# Patient Record
Sex: Female | Born: 1940 | Race: White | Hispanic: No | Marital: Married | Smoking: Former smoker
Health system: Southern US, Community
[De-identification: ages and names within clinical notes are randomized; demographics above are authoritative.]

## PROBLEM LIST (undated history)

## (undated) DIAGNOSIS — E039 Hypothyroidism, unspecified: Secondary | ICD-10-CM

---

## 2020-04-15 ENCOUNTER — Emergency Department (HOSPITAL_COMMUNITY): Payer: Medicare PPO

## 2020-04-15 ENCOUNTER — Other Ambulatory Visit: Payer: Self-pay

## 2020-04-15 ENCOUNTER — Inpatient Hospital Stay (HOSPITAL_COMMUNITY)
Admission: EM | Admit: 2020-04-15 | Discharge: 2020-05-09 | DRG: 884 | Disposition: A | Discharge status: Skilled Nursing Facility | Payer: Medicare PPO | Attending: Family Medicine | Admitting: Family Medicine

## 2020-04-15 ENCOUNTER — Encounter (HOSPITAL_COMMUNITY): Payer: Self-pay | Admitting: Internal Medicine

## 2020-04-15 DIAGNOSIS — R739 Hyperglycemia, unspecified: Secondary | ICD-10-CM | POA: Diagnosis present

## 2020-04-15 DIAGNOSIS — H5789 Other specified disorders of eye and adnexa: Secondary | ICD-10-CM | POA: Diagnosis present

## 2020-04-15 DIAGNOSIS — R55 Syncope and collapse: Secondary | ICD-10-CM | POA: Diagnosis present

## 2020-04-15 DIAGNOSIS — G8929 Other chronic pain: Secondary | ICD-10-CM | POA: Diagnosis present

## 2020-04-15 DIAGNOSIS — E039 Hypothyroidism, unspecified: Secondary | ICD-10-CM | POA: Diagnosis present

## 2020-04-15 DIAGNOSIS — N183 Chronic kidney disease, stage 3 unspecified: Secondary | ICD-10-CM | POA: Diagnosis present

## 2020-04-15 DIAGNOSIS — M479 Spondylosis, unspecified: Secondary | ICD-10-CM | POA: Diagnosis present

## 2020-04-15 DIAGNOSIS — I700 Atherosclerosis of aorta: Secondary | ICD-10-CM | POA: Diagnosis present

## 2020-04-15 DIAGNOSIS — I2510 Atherosclerotic heart disease of native coronary artery without angina pectoris: Secondary | ICD-10-CM | POA: Diagnosis present

## 2020-04-15 DIAGNOSIS — E860 Dehydration: Secondary | ICD-10-CM | POA: Diagnosis present

## 2020-04-15 DIAGNOSIS — G9341 Metabolic encephalopathy: Secondary | ICD-10-CM | POA: Insufficient documentation

## 2020-04-15 DIAGNOSIS — R1084 Generalized abdominal pain: Secondary | ICD-10-CM | POA: Diagnosis present

## 2020-04-15 DIAGNOSIS — E8889 Other specified metabolic disorders: Secondary | ICD-10-CM | POA: Diagnosis present

## 2020-04-15 DIAGNOSIS — F0391 Unspecified dementia with behavioral disturbance: Secondary | ICD-10-CM | POA: Diagnosis present

## 2020-04-15 DIAGNOSIS — I129 Hypertensive chronic kidney disease with stage 1 through stage 4 chronic kidney disease, or unspecified chronic kidney disease: Secondary | ICD-10-CM | POA: Diagnosis present

## 2020-04-15 DIAGNOSIS — K5900 Constipation, unspecified: Secondary | ICD-10-CM | POA: Diagnosis present

## 2020-04-15 DIAGNOSIS — F329 Major depressive disorder, single episode, unspecified: Secondary | ICD-10-CM | POA: Diagnosis present

## 2020-04-15 DIAGNOSIS — Z9119 Patient's noncompliance with other medical treatment and regimen: Secondary | ICD-10-CM

## 2020-04-15 DIAGNOSIS — R4182 Altered mental status, unspecified: Secondary | ICD-10-CM

## 2020-04-15 DIAGNOSIS — Z515 Encounter for palliative care: Secondary | ICD-10-CM

## 2020-04-15 DIAGNOSIS — F40240 Claustrophobia: Secondary | ICD-10-CM | POA: Diagnosis present

## 2020-04-15 DIAGNOSIS — R627 Adult failure to thrive: Secondary | ICD-10-CM

## 2020-04-15 DIAGNOSIS — Z79890 Hormone replacement therapy (postmenopausal): Secondary | ICD-10-CM | POA: Diagnosis not present

## 2020-04-15 DIAGNOSIS — Z66 Do not resuscitate: Secondary | ICD-10-CM | POA: Diagnosis present

## 2020-04-15 DIAGNOSIS — K449 Diaphragmatic hernia without obstruction or gangrene: Secondary | ICD-10-CM | POA: Diagnosis not present

## 2020-04-15 DIAGNOSIS — N1831 Chronic kidney disease, stage 3a: Secondary | ICD-10-CM | POA: Diagnosis not present

## 2020-04-15 DIAGNOSIS — N179 Acute kidney failure, unspecified: Secondary | ICD-10-CM | POA: Diagnosis not present

## 2020-04-15 DIAGNOSIS — F03918 Unspecified dementia, unspecified severity, with other behavioral disturbance: Secondary | ICD-10-CM | POA: Diagnosis present

## 2020-04-15 DIAGNOSIS — Z79899 Other long term (current) drug therapy: Secondary | ICD-10-CM

## 2020-04-15 DIAGNOSIS — K219 Gastro-esophageal reflux disease without esophagitis: Secondary | ICD-10-CM | POA: Diagnosis present

## 2020-04-15 DIAGNOSIS — E7800 Pure hypercholesterolemia, unspecified: Secondary | ICD-10-CM | POA: Diagnosis present

## 2020-04-15 DIAGNOSIS — R32 Unspecified urinary incontinence: Secondary | ICD-10-CM | POA: Diagnosis present

## 2020-04-15 DIAGNOSIS — F0390 Unspecified dementia without behavioral disturbance: Secondary | ICD-10-CM | POA: Diagnosis not present

## 2020-04-15 DIAGNOSIS — G301 Alzheimer's disease with late onset: Secondary | ICD-10-CM | POA: Diagnosis not present

## 2020-04-15 DIAGNOSIS — F0281 Dementia in other diseases classified elsewhere with behavioral disturbance: Secondary | ICD-10-CM | POA: Diagnosis present

## 2020-04-15 DIAGNOSIS — M069 Rheumatoid arthritis, unspecified: Secondary | ICD-10-CM | POA: Diagnosis present

## 2020-04-15 DIAGNOSIS — Z20822 Contact with and (suspected) exposure to covid-19: Secondary | ICD-10-CM | POA: Diagnosis present

## 2020-04-15 DIAGNOSIS — R531 Weakness: Secondary | ICD-10-CM | POA: Diagnosis not present

## 2020-04-15 DIAGNOSIS — M419 Scoliosis, unspecified: Secondary | ICD-10-CM | POA: Diagnosis present

## 2020-04-15 DIAGNOSIS — R159 Full incontinence of feces: Secondary | ICD-10-CM | POA: Diagnosis present

## 2020-04-15 DIAGNOSIS — F03C0 Unspecified dementia, severe, without behavioral disturbance, psychotic disturbance, mood disturbance, and anxiety: Secondary | ICD-10-CM

## 2020-04-15 DIAGNOSIS — R1310 Dysphagia, unspecified: Secondary | ICD-10-CM | POA: Diagnosis not present

## 2020-04-15 DIAGNOSIS — Z7189 Other specified counseling: Secondary | ICD-10-CM | POA: Diagnosis not present

## 2020-04-15 DIAGNOSIS — Z87891 Personal history of nicotine dependence: Secondary | ICD-10-CM | POA: Diagnosis not present

## 2020-04-15 DIAGNOSIS — Z955 Presence of coronary angioplasty implant and graft: Secondary | ICD-10-CM

## 2020-04-15 DIAGNOSIS — R410 Disorientation, unspecified: Secondary | ICD-10-CM | POA: Diagnosis not present

## 2020-04-15 HISTORY — DX: Hypothyroidism, unspecified: E03.9

## 2020-04-15 LAB — CBC WITH DIFFERENTIAL/PLATELET
Abs Immature Granulocytes: 0.03 10*3/uL (ref 0.00–0.07)
Basophils Absolute: 0.1 10*3/uL (ref 0.0–0.1)
Basophils Relative: 1 %
Eosinophils Absolute: 0.1 10*3/uL (ref 0.0–0.5)
Eosinophils Relative: 1 %
HCT: 42.2 % (ref 36.0–46.0)
Hemoglobin: 13.8 g/dL (ref 12.0–15.0)
Immature Granulocytes: 0 %
Lymphocytes Relative: 11 %
Lymphs Abs: 1.3 10*3/uL (ref 0.7–4.0)
MCH: 31.4 pg (ref 26.0–34.0)
MCHC: 32.7 g/dL (ref 30.0–36.0)
MCV: 96.1 fL (ref 80.0–100.0)
Monocytes Absolute: 1.4 10*3/uL — ABNORMAL HIGH (ref 0.1–1.0)
Monocytes Relative: 12 %
Neutro Abs: 8.6 10*3/uL — ABNORMAL HIGH (ref 1.7–7.7)
Neutrophils Relative %: 75 %
Platelets: 353 10*3/uL (ref 150–400)
RBC: 4.39 MIL/uL (ref 3.87–5.11)
RDW: 13.3 % (ref 11.5–15.5)
WBC: 11.4 10*3/uL — ABNORMAL HIGH (ref 4.0–10.5)
nRBC: 0 % (ref 0.0–0.2)

## 2020-04-15 LAB — URINALYSIS, ROUTINE W REFLEX MICROSCOPIC
Bacteria, UA: NONE SEEN
Bilirubin Urine: NEGATIVE
Glucose, UA: NEGATIVE mg/dL
Ketones, ur: 20 mg/dL — AB
Leukocytes,Ua: NEGATIVE
Nitrite: NEGATIVE
Protein, ur: 100 mg/dL — AB
Specific Gravity, Urine: 1.025 (ref 1.005–1.030)
pH: 5 (ref 5.0–8.0)

## 2020-04-15 LAB — COMPREHENSIVE METABOLIC PANEL
ALT: 18 U/L (ref 0–44)
AST: 25 U/L (ref 15–41)
Albumin: 3.9 g/dL (ref 3.5–5.0)
Alkaline Phosphatase: 59 U/L (ref 38–126)
Anion gap: 12 (ref 5–15)
BUN: 26 mg/dL — ABNORMAL HIGH (ref 8–23)
CO2: 24 mmol/L (ref 22–32)
Calcium: 9.7 mg/dL (ref 8.9–10.3)
Chloride: 102 mmol/L (ref 98–111)
Creatinine, Ser: 1.27 mg/dL — ABNORMAL HIGH (ref 0.44–1.00)
GFR calc Af Amer: 47 mL/min — ABNORMAL LOW (ref >60)
GFR calc non Af Amer: 41 mL/min — ABNORMAL LOW (ref >60)
Glucose, Bld: 102 mg/dL — ABNORMAL HIGH (ref 70–99)
Potassium: 3.9 mmol/L (ref 3.5–5.1)
Sodium: 138 mmol/L (ref 135–145)
Total Bilirubin: 1.1 mg/dL (ref 0.3–1.2)
Total Protein: 7.1 g/dL (ref 6.5–8.1)

## 2020-04-15 LAB — SARS CORONAVIRUS 2 BY RT PCR (HOSPITAL ORDER, PERFORMED IN ~~LOC~~ HOSPITAL LAB): SARS Coronavirus 2: NEGATIVE

## 2020-04-15 LAB — LIPASE, BLOOD: Lipase: 15 U/L (ref 11–51)

## 2020-04-15 MED ORDER — ZIPRASIDONE HCL 40 MG PO CAPS
40.0000 mg | ORAL_CAPSULE | Freq: Two times a day (BID) | ORAL | Status: DC
  Administered 2020-04-15 – 2020-04-19 (×8): 40 mg via ORAL
  Filled 2020-04-15 (×2): qty 1
  Filled 2020-04-15: qty 2
  Filled 2020-04-15 (×6): qty 1

## 2020-04-15 MED ORDER — SODIUM CHLORIDE 0.9 % IV BOLUS
1000.0000 mL | Freq: Once | INTRAVENOUS | Status: AC
  Administered 2020-04-15: 1000 mL via INTRAVENOUS

## 2020-04-15 MED ORDER — HEPARIN SODIUM (PORCINE) 5000 UNIT/ML IJ SOLN
5000.0000 [IU] | Freq: Three times a day (TID) | INTRAMUSCULAR | Status: DC
  Administered 2020-04-15 – 2020-04-17 (×5): 5000 [IU] via SUBCUTANEOUS
  Filled 2020-04-15 (×3): qty 1

## 2020-04-15 MED ORDER — HALOPERIDOL LACTATE 5 MG/ML IJ SOLN
2.0000 mg | Freq: Once | INTRAMUSCULAR | Status: AC
  Administered 2020-04-15: 2 mg via INTRAVENOUS
  Filled 2020-04-15: qty 1

## 2020-04-15 NOTE — ED Notes (Signed)
Pt self removed IV. Cathter in tact. Site cleaned and wrapped.

## 2020-04-15 NOTE — ED Notes (Signed)
In and out unsuccessfull X2. Byrd Hesselbach PA made aware

## 2020-04-15 NOTE — ED Notes (Signed)
Pt removed bp cuff and refuses vitals at this time

## 2020-04-15 NOTE — ED Notes (Signed)
Pt refuses vitals and removed BP cuff and pulse ox

## 2020-04-15 NOTE — ED Notes (Signed)
Pt able to drink a few sips of water.

## 2020-04-15 NOTE — H&P (Signed)
History and Physical    Kristin Grimes ELF:[id number] DOB: 1941-08-26 DOA: 04/15/2020  PCP: Patient, No Pcp Per   Chief Complaint: Mental status changes/combative nature  HPI: Kristin Grimes is a 79 y.o. female with unknown medical history other than advanced dementia, questionable esophageal surgery unspecified.  Patient recently moved from [place] with husband with known advanced dementia with worsening mental status over the past 4 weeks since the move.  Family is concerned that this is a psychotic issue related to her dementia worsening acutely and is now refusing to take p.o.  Patient's family is also concerned that her refusal of p.o. may be related in someway to her distant history of esophageal surgery.  There is discussion about possible syncopal event over the past few weeks but patient was brought to the ED acutely today due to worsening mental status and combative nature with family at home.   ED Course: In the ED patient was evaluated routine lab work was obtained, patient became markedly combative requiring 2 mg of IV Haldol with minimal to no improvement, subsequently patient was given 40 mg of Geodon p.o. with moderate improvement but ultimately decision was made for one-to-one sitter to be at bedside given patient's ongoing removal of close, IV was pulled, telemetry was ripped off.  Unfortunately husband left in the middle of patient's combative state and would not return to help reorient the patient as he was "worn out."  Patient had routine imaging including CT head given mental status changes and chest x-ray with no overt findings.  Patient's lab work was equally as unimpressive with minimally elevated glucose but otherwise unremarkable for any acute findings.   Review of Systems: Markedly limited given patient's mental status and combative nature.   Assessment/Plan Active Problems:   Acute metabolic encephalopathy   Acute metabolic encephalopathy of unclear  etiology in origin rule out delirium on advanced dementia No clear indication for infectious process Rule out polypharmacy -At this time patient is alert to person only, cannot recall husband's name or location her home address her birthday or any other meaningful information at this time -Husband indicates that patient is usually quite challenged with historical questions but is not as aggressive or combative as she has been over the past few weeks -We discussed that given patient's recent moved to a new location with husband with advanced dementia this was likely only worsening her condition as she cannot reorient to a new location and does not understand her surroundings, certainly being in the hospital will only exacerbate this, patient is at exceedingly high risk for hospital delirium -We will hold all medications in case polypharmacy is the culprit, patient is essentially refusing all care at this point, again we would stress to the family to be present during treatment to help reorient the patient and to ensure appropriate evaluations can be performed and medications can be administered -Family is asking for a psychiatry referral which we discussed would be somewhat limited in the inpatient setting as the patient is neither suicidal or homicidal we can certainly ask psychiatry to weigh in tomorrow for medication recommendations, patient will need outpatient follow-up as well given recent move and no follow-up here for PCP, psych, or otherwise  History of esophageal surgery, unspecified -Unclear history, surgery was performed at [facility] some years ago, family indicate patient had a poor p.o. intake at that time with abdominal pain and a single event of syncope -Patient again is poorly taking p.o. but appears to be without any pain on  exam or history, patient had a single episode of syncope at some point over the past few weeks unspecified but family did not bring patient in at that point, unclear if  orthostatic in nature given poor p.o. intake -No indication for surgical or GI consult at this time, attempted to advance patient's diet this afternoon but she refused to take any p.o. from people which she "does not know"  Questionable syncopal event -As above unclear event, was not within the past few days, possibly orthostatic given patient's poor p.o. intake per family -Family relates syncope to previous esophageal surgery, again patient's abdominal exam is benign and patient and family deny any complaints of pain chest poor p.o. intake over the past few weeks  DVT prophylaxis: SCDs Code Status: Full Family Communication: Husband over the phone Status is: Inpatient  Dispo: The patient is from: Home              Anticipated d/c is to: Unclear, pending further work-up and evaluation              Anticipated d/c date is: Likely 48 to 72 hours, potentially longer if patient has prolonged delirium event or requires further imaging, medication or possibly inpatient psychiatric care              Patient currently not medically stable for discharge due to ongoing need for close monitoring, medication management in the setting of acute delirium, aggressive behavior and confusion.  Consultants:   None  Procedures:   None planned  Past medical history as above, advanced dementia, past surgical history as above unspecified esophageal surgery; family denies any use of tobacco alcohol or illicit substances.  Family history noncontributory given patient's age.  Prior to Admission medications   Not on File    Physical Exam: Vitals:   04/15/20 1133 04/15/20 1145 04/15/20 1200 04/15/20 1245  BP: (!) 169/93 (!) 181/146    Pulse: 72 82 76   Resp: (!) 22 18 18  (!) 21  Temp: 98.2 F (36.8 C)     TempSrc: Oral     SpO2: 100% 100% 98%     Constitutional: NAD, calm, comfortable Vitals:   04/15/20 1133 04/15/20 1145 04/15/20 1200 04/15/20 1245  BP: (!) 169/93 (!) 181/146    Pulse: 72 82 76     Resp: (!) 22 18 18  (!) 21  Temp: 98.2 F (36.8 C)     TempSrc: Oral     SpO2: 100% 100% 98%    *Of note physical exam somewhat limited due to patient's combative nature* General:  Pleasantly resting in bed, No acute distress. HEENT:  Normocephalic atraumatic.  Sclerae nonicteric, noninjected.  Extraocular movements intact bilaterally. Neck:  Without mass or deformity.  Trachea is midline. Lungs: Without respiratory distress, no accessory muscle use Heart:  Regular rate and rhythm.  Without murmurs, rubs, or gallops. Abdomen:  Soft, nontender, nondistended -unable to auscultate Extremities: Without cyanosis, clubbing, edema, or obvious deformity on limited exam Skin:  Warm and dry, without obvious ulcerations or erythema on limited exam  Labs on Admission: I have personally reviewed following labs and imaging studies  CBC: Recent Labs  Lab 04/15/20 1142  WBC 11.4*  NEUTROABS 8.6*  HGB 13.8  HCT 42.2  MCV 96.1  PLT 353   Basic Metabolic Panel: Recent Labs  Lab 04/15/20 1142  NA 138  K 3.9  CL 102  CO2 24  GLUCOSE 102*  BUN 26*  CREATININE 1.27*  CALCIUM 9.7   GFR:  CrCl cannot be calculated (Unknown ideal weight.). Liver Function Tests: Recent Labs  Lab 04/15/20 1142  AST 25  ALT 18  ALKPHOS 59  BILITOT 1.1  PROT 7.1  ALBUMIN 3.9   Recent Labs  Lab 04/15/20 1142  LIPASE 15   No results for input(s): AMMONIA in the last 168 hours. Coagulation Profile: No results for input(s): INR, PROTIME in the last 168 hours. Cardiac Enzymes: No results for input(s): CKTOTAL, CKMB, CKMBINDEX, TROPONINI in the last 168 hours. BNP (last 3 results) No results for input(s): PROBNP in the last 8760 hours. HbA1C: No results for input(s): HGBA1C in the last 72 hours. CBG: No results for input(s): GLUCAP in the last 168 hours. Lipid Profile: No results for input(s): CHOL, HDL, LDLCALC, TRIG, CHOLHDL, LDLDIRECT in the last 72 hours. Thyroid Function Tests: No results  for input(s): TSH, T4TOTAL, FREET4, T3FREE, THYROIDAB in the last 72 hours. Anemia Panel: No results for input(s): VITAMINB12, FOLATE, FERRITIN, TIBC, IRON, RETICCTPCT in the last 72 hours. Urine analysis: No results found for: COLORURINE, APPEARANCEUR, LABSPEC, PHURINE, GLUCOSEU, HGBUR, BILIRUBINUR, KETONESUR, PROTEINUR, UROBILINOGEN, NITRITE, LEUKOCYTESUR  Radiological Exams on Admission: CT Head Wo Contrast  Result Date: 04/15/2020 CLINICAL DATA:  Altered mental status. EXAM: CT HEAD WITHOUT CONTRAST TECHNIQUE: Contiguous axial images were obtained from the base of the skull through the vertex without intravenous contrast. COMPARISON:  None. FINDINGS: Brain: Mild diffuse cerebral atrophy with ex vacuo dilatation. Scattered and confluent periventricular and deep white matter hypodensities are nonspecific however commonly associated with chronic microvascular ischemic changes. No midline shift. No mass lesion. No extra-axial fluid collection. No acute infarct or intracranial hemorrhage. Motion artifact limits evaluation of the vertex. Vascular: No hyperdense vessel. Bilateral carotid siphon atherosclerotic calcifications. Skull: Negative for fracture or focal lesion. Sinuses/Orbits: Normal orbits. Layering bilateral sphenoid sinus secretions. No mastoid effusion. Other: None. IMPRESSION: No acute intracranial process. Mild cerebral atrophy and chronic microvascular ischemic changes. Layering sphenoid sinus secretions may reflect active sinus disease. Electronically Signed   By: Stana Bunting M.D.   On: 04/15/2020 13:40   DG Chest Portable 1 View  Result Date: 04/15/2020 CLINICAL DATA:  Altered mental status. EXAM: PORTABLE CHEST 1 VIEW COMPARISON:  None. FINDINGS: Telemetry wires overlie the upper and left thorax. Bibasilar streaky opacities. No pneumothorax or pleural effusion. Aortic atherosclerotic calcifications. Cardiomediastinal silhouette is within normal limits of size. Multilevel  spondylosis and left glenohumeral osteoarthrosis. IMPRESSION: Bibasilar opacities, likely atelectasis. Electronically Signed   By: Stana Bunting M.D.   On: 04/15/2020 13:35    Azucena Fallen DO [facility]  If 7PM-7AM, please contact night-coverage [email or web address]   04/15/2020, 3:07 PM

## 2020-04-15 NOTE — ED Notes (Addendum)
Pt refused food at this time. Pt ate two bites of apple sauce with medication

## 2020-04-15 NOTE — ED Triage Notes (Signed)
Pt arrived via [facility] from home CC abd, nausea and hallucinations reported by family. Pt and family deny emesis and medication compliance. Per EMS ambulatory at baseline and A/OX4.    Per husband would like to request psych eval and is otw.    Hx dementia, HTN, GERD

## 2020-04-15 NOTE — TOC Initial Note (Signed)
Transition of Care [facility]) - Initial/Assessment Note    Patient Details  Name: Kristin Grimes MRN: [id number] Date of Birth: 1941-01-20  [facility] CM/SW Contact:    Elliot Cousin, RN Phone Number: [phone] 04/15/2020, 2:26 PM  Clinical Narrative:                 TOC CM spoke to pt at bedside, she is disoriented to situation, place and time. Gave permission to speak to husband. Contacted husband via phone states they recently moved to be closer to son and his wife. States son is working on PCP for pt. Gave permission to speak to son, [name]. Contacted son and states they have been working with  [facility] to discuss long term care at ALF/Memory Care. States pt has not had a mental health evaluation or seen a specialist in a long time for Dementia. The facility is requiring prior to pt being admitted to ALF/Memory Care. States his father is having a difficult time managing her at home. Educated husband/son that follow up outpt with specialist and PCP will be able to help with pt's long term needs for treatment and proper placement. Message sent to Ed provider.    Expected Discharge Plan: Skilled Nursing Facility Barriers to Discharge: Continued Medical Work up   Patient Goals and CMS Choice        Expected Discharge Plan and Services Expected Discharge Plan: Skilled Nursing Facility In-house Referral: Clinical Social Work Discharge Planning Services: CM Consult   Living arrangements for the past 2 months: Apartment                                      Prior Living Arrangements/Services Living arrangements for the past 2 months: Apartment Lives with:: Spouse Patient language and need for interpreter reviewed:: Yes        Need for Family Participation in Patient Care: Yes (Comment) Care giver support system in place?: Yes (comment) Current home services: DME(rolling walker) Criminal Activity/Legal Involvement Pertinent to Current  Situation/Hospitalization: No - Comment as needed  Activities of Daily Living      Permission Sought/Granted Permission sought to share information with : Case Manager, Facility Medical sales representative, PCP, Family Supports Permission granted to share information with : Yes, Verbal Permission Granted  Share Information with NAME: Floy, Angert  Permission granted to share info w AGENCY: SNF  Permission granted to share info w Relationship: husband, son  Permission granted to share info w Contact Information: [phone]  Emotional Assessment Appearance:: Appears stated age   Affect (typically observed): Agitated Orientation: : Oriented to Self      Admission diagnosis:  abdominal pain hallucinations;possible uti There are no problems to display for this patient.  PCP:  Patient, No Pcp Per Pharmacy:  No Pharmacies Listed    Social Determinants of Health (SDOH) Interventions    Readmission Risk Interventions No flowsheet data found.

## 2020-04-15 NOTE — ED Notes (Signed)
Case management at bedside.

## 2020-04-15 NOTE — ED Notes (Signed)
Pt removed yellow socks, gown and threw in floor. Pt was re dressed and covered up.

## 2020-04-15 NOTE — ED Notes (Signed)
Pt removed blankets and yellow socks and threw on floor

## 2020-04-15 NOTE — ED Notes (Signed)
Pts husband and Byrd Hesselbach PA at bedside   Pt states " yall are animals in here I need to get out of here let em out of here before you hurt me"

## 2020-04-15 NOTE — ED Provider Notes (Addendum)
[facility]-EMERGENCY DEPT Provider Note   CSN: [id number] Arrival date & time: 04/15/20  1118     History Chief Complaint  Patient presents with  . Abdominal Pain  . Dementia    Greg Eckrich is a 79 y.o. female.  HPI  LEVEL 5 CAVEAT 47/58 AMS  79 year old female with a history of dementia, esophageal hernia, hypertension, GERD (history is limited secondary to limited chart review as the patient just moved here from [place]) presents with altered mental status, failure to thrive, and abdominal pain.  History provided by husband and son.  They report that she has a history of pseudodementia, which has been worsening over the last 4 weeks since their move from [place] to [place].  However the last 3 days, the patient has been increasingly agitated, verbally aggressive, has not eaten or drinking anything in 5 days.  Patient's husband refers that she has a history of an esophageal hernia and "stomach was twisted" proximally 5 years ago with surgery done at [facility].  He states that she used to have syncopal episodes with lots of burping and complaining of abdominal pain.  After her surgery, she stopped complaining about this.  However over the last 2 weeks, she has now started again complaining of abdominal pain, had 1 syncopal episode 2 weeks ago, and has not been eating or drinking anything.  Husband relates that she has had episodes of incontinence of bowel and bladder.  Triage notes state that she had episodes of syncope with hallucinations and eyes rolling back into her head, though her husband refutes this.  She has not had any recent falls.  She is not on a blood thinners.  He is concerned that her esophageal hernia might have returned since she complains of similar symptoms that mimicked her last hernia.  He denied any fevers, nausea, vomiting, bloody diarrhea.  They expressed a concern over there ability to continue caring for their mother.  She is not on any medication  for her dementia and they have no physicians in the [place] area.  They are questioning if she needs to start the medications or if she needs to be placed in a facility.  Her son reiterates that her latest mental status has been extremely taxing on his father and he is worried about his father's own mental health secondary to this.  They are asking for an evaluation of the patient and possible help in managing her dementia.    No past medical history on file.  There are no problems to display for this patient.      OB History   No obstetric history on file.     No family history on file.  Social History   Tobacco Use  . Smoking status: Not on file  Substance Use Topics  . Alcohol use: Not on file  . Drug use: Not on file    Home Medications Prior to Admission medications   Not on File    Allergies    Patient has no allergy information on record.  Review of Systems   Review of Systems  Unable to perform ROS: Dementia    Physical Exam Updated Vital Signs BP (!) 181/146   Pulse 76   Temp 98.2 F (36.8 C) (Oral)   Resp (!) 21   SpO2 98%   Physical Exam Vitals and nursing note reviewed.  Constitutional:      General: She is not in acute distress.    Appearance: She is well-developed. She  is not ill-appearing, toxic-appearing or diaphoretic.     Comments: Physical exam limited secondary to patient being combative and restless.  She is alert, oriented to self, but not place and time.  She states "how my supposed to know where I am and what year it is".  She is moving all 4 extremities without difficulty  HENT:     Head: Normocephalic and atraumatic.  Eyes:     Conjunctiva/sclera: Conjunctivae normal.  Cardiovascular:     Rate and Rhythm: Normal rate and regular rhythm.     Heart sounds: Normal heart sounds. No murmur.  Pulmonary:     Effort: Pulmonary effort is normal. No respiratory distress.     Breath sounds: Normal breath sounds.  Abdominal:      General: Abdomen is flat.     Palpations: Abdomen is soft.     Tenderness: There is no abdominal tenderness.     Hernia: No hernia is present.     Comments: Abdomen soft and nontender   Musculoskeletal:     Cervical back: Neck supple.     Comments: Visible scoliosis of the spine  Skin:    General: Skin is warm and dry.     Coloration: Skin is not pale.     Findings: No rash.  Neurological:     Mental Status: She is alert.  Psychiatric:        Attention and Perception: She does not perceive auditory or visual hallucinations.        Mood and Affect: Affect is angry.        Speech: Speech is rapid and pressured.        Behavior: Behavior is agitated, aggressive and hyperactive.     ED Results / Procedures / Treatments   Labs (all labs ordered are listed, but only abnormal results are displayed) Labs Reviewed  CBC WITH DIFFERENTIAL/PLATELET - Abnormal; Notable for the following components:      Result Value   WBC 11.4 (*)    Neutro Abs 8.6 (*)    Monocytes Absolute 1.4 (*)    All other components within normal limits  COMPREHENSIVE METABOLIC PANEL - Abnormal; Notable for the following components:   Glucose, Bld 102 (*)    BUN 26 (*)    Creatinine, Ser 1.27 (*)    GFR calc non Af Amer 41 (*)    GFR calc Af Amer 47 (*)    All other components within normal limits  SARS CORONAVIRUS 2 BY RT PCR (HOSPITAL ORDER, PERFORMED IN [facility])  LIPASE, BLOOD  URINALYSIS, ROUTINE W REFLEX MICROSCOPIC    EKG EKG Interpretation  Date/Time:  Friday April 15 2020 11:48:50 EDT Ventricular Rate:  83 PR Interval:    QRS Duration: 125 QT Interval:  383 QTC Calculation: 450 R Axis:   -51 Text Interpretation: Sinus rhythm Paired ventricular premature complexes 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 EXAM: CT HEAD WITHOUT CONTRAST TECHNIQUE: Contiguous axial images were obtained from the base of the skull through the vertex without intravenous contrast. COMPARISON:  None. FINDINGS: Brain: Mild diffuse cerebral atrophy with ex vacuo dilatation. Scattered and confluent periventricular and deep white matter hypodensities are nonspecific however commonly associated with chronic microvascular ischemic changes. No midline shift. No mass lesion. No extra-axial fluid collection. No acute infarct or intracranial  hemorrhage. Motion artifact limits evaluation of the vertex. Vascular: No hyperdense vessel. Bilateral carotid siphon atherosclerotic calcifications. Skull: Negative for fracture or focal lesion. Sinuses/Orbits: Normal orbits. Layering bilateral sphenoid sinus secretions. No mastoid effusion. Other: None. IMPRESSION: No acute intracranial process. Mild cerebral atrophy and chronic microvascular ischemic changes. Layering sphenoid sinus secretions may reflect active sinus disease. Electronically Signed   By: Stana Bunting M.D.   On: 04/15/2020 13:40   DG Chest Portable 1 View  Result Date: 04/15/2020 CLINICAL DATA:  Altered mental status. EXAM: PORTABLE CHEST 1 VIEW COMPARISON:  None. FINDINGS: Telemetry wires overlie the upper and left thorax. Bibasilar streaky opacities. No pneumothorax or pleural effusion. Aortic atherosclerotic calcifications. Cardiomediastinal silhouette is within normal limits of size. Multilevel spondylosis and left glenohumeral osteoarthrosis. IMPRESSION: Bibasilar opacities, likely atelectasis. Electronically Signed   By: Stana Bunting M.D.   On: 04/15/2020 13:35    Procedures Procedures (including critical care time)  Medications Ordered in ED Medications  haloperidol lactate (HALDOL) injection 2 mg (2 mg Intravenous Given 04/15/20 1227)  sodium chloride 0.9 % bolus 1,000 mL (0 mLs Intravenous Stopped 04/15/20 1400)    ED Course  I  have reviewed the triage vital signs and the nursing notes.  Pertinent labs & imaging results that were available during my care of the patient were reviewed by me and considered in my medical decision making (see chart for details).    MDM Rules/Calculators/A&P                     79 year old female with altered mental status, failure to thrive, and abdominal pain x several weeks On presentation to the ER, the patient is alert, but only oriented to self, not place and time.  She is extremely agitated, trying to climb out of the bed, yelling obscenities at the nurses.  She is overall well-appearing, nontoxic looking, in no acute respiratory or physical distress.  Physical exam limited secondary to agitation, but her abdomen is soft and nontender, heart and lung exam without abnormalities.  No evidence of rashes or bruises.  She does have a history of severe scoliosis which is evident on physical exam.  She is hypertensive and slightly tachycardic, but other vitals are reassuring.  We will start with generalized altered mental status work-up, will anticipate need for admission as the patient has not been eating or drinking over the last 5 days.  I had extensive conversations with her husband and her son, her husband is her power of attorney, but he states that he will be amenable to whenever his son wants to be done for his wife.  They expressed extensive concern over their ability to care for their mother, states that her recent decline in her mental status has been extremely taxing to the family, especially her husband.  They are also concerned that potentially her esophageal hernia has returned, as she seems to be complaining of abdominal pain, having increased burping, and has had one episode of syncope which were all symptoms that she had before her hernia surgery.  Her son relates however that she is constantly complaining of pain all over, which they are not sure if this is legitimate or secondary to  her dementia.  They have expressed that they will need help in managing her dementia and possible SNF placement.  1:43 PM: CBC with mild leukocytosis of 11.4, normal hemoglobin.  CMP without significant electrolyte abnormalities, her BUN and creatinine are mildly elevated at 1.27 and 26  respectively, no previous lab work to compare this to but I suspect this is this in the setting of dehydration.  Lipase normal.  UA still pending.  She is hypertensive in the ED, though I suspect this is secondary to her level of agitation.  She did receive 2 of Haldol in the ER.  Receiving fluid bolus.  Chest x-ray with bibasilar atelectasis, no acute infection.  EKG normal sinus rhythm.  CT head without acute intracranial process.  Will consult GI and then consult hospitalist for admission.   2:54PM: Consulted Dr. Avon Gully w/ hospitalist group, she will be admitted for failure to thrive, worsening altered mental status. I have also consulted [facility], they have ordered a barium swallow to evaluate anatomy of the esophagus, they will continue to follow.  Nursing staff attempted in and out cath x2 without success.  I spoke with the patient's son and updated them of her admission.  She will likely also need evaluation by psychiatry for formal diagnosis and probable medication start.  Transitions of care has also been consulted, they are working on SNF placement, requested PT consult which I gladly placed.  Patient has remained hemodynamically stable throughout the ED course.  I discussed the case with Dr. Alvino Chapel who is agreeable to the above plan.   FOn breathinal Clinical Impression(s) / ED Diagnoses Final diagnoses:  Esophageal hernia  Generalized abdominal pain  Failure to thrive in adult  Altered mental status, unspecified altered mental status type    Rx / DC Orders ED Discharge Orders    None           Davonna Belling, MD 04/15/20 1513    Garald Balding, PA-C 04/15/20 1532    Davonna Belling, MD 04/16/20 [phone]

## 2020-04-15 NOTE — ED Notes (Signed)
Pt refuses vitals and removed BP cuff and pulse ox °

## 2020-04-15 NOTE — ED Notes (Signed)
Bladder scanner showed 

## 2020-04-15 NOTE — ED Notes (Signed)
Pt removed BP cuff stating " I dont want that thing on me its too tight"

## 2020-04-15 NOTE — Progress Notes (Signed)
Called earlier by ED about patient with symptoms similar to when she required " esophageal hernia repair' at [facility] 5 years ago. I suspect this was a hiatal hernia repair?    Having abdominal pain,  belching and ? Syncope. ED inquiring about imaging. Her LFTs and lipase are okay. Recommended upper GI series to evaluate post-surgical anatomy. Still awaiting study to be done.

## 2020-04-15 NOTE — ED Notes (Signed)
Pt aware urine sample needed. Unable to place pure wick due to pt increased mobility in bed. Pt denies urge to void at this time

## 2020-04-15 NOTE — ED Notes (Signed)
Pts son at bedside to report new information about pts status. Pts son reports " my mother has not eaten in 5 days and often experiences syncopal episode without LOC. My dad cannot take care of her at home anymore and I want to have her evaluated for Psych." Byrd Hesselbach PA made aware

## 2020-04-16 DIAGNOSIS — R627 Adult failure to thrive: Secondary | ICD-10-CM

## 2020-04-16 DIAGNOSIS — G9341 Metabolic encephalopathy: Secondary | ICD-10-CM

## 2020-04-16 DIAGNOSIS — F0390 Unspecified dementia without behavioral disturbance: Secondary | ICD-10-CM

## 2020-04-16 DIAGNOSIS — R4182 Altered mental status, unspecified: Secondary | ICD-10-CM

## 2020-04-16 DIAGNOSIS — R410 Disorientation, unspecified: Secondary | ICD-10-CM

## 2020-04-16 DIAGNOSIS — R1310 Dysphagia, unspecified: Secondary | ICD-10-CM

## 2020-04-16 DIAGNOSIS — R55 Syncope and collapse: Secondary | ICD-10-CM

## 2020-04-16 LAB — COMPREHENSIVE METABOLIC PANEL
ALT: 22 U/L (ref 0–44)
AST: 32 U/L (ref 15–41)
Albumin: 4.2 g/dL (ref 3.5–5.0)
Alkaline Phosphatase: 66 U/L (ref 38–126)
Anion gap: 12 (ref 5–15)
BUN: 21 mg/dL (ref 8–23)
CO2: 23 mmol/L (ref 22–32)
Calcium: 9.9 mg/dL (ref 8.9–10.3)
Chloride: 102 mmol/L (ref 98–111)
Creatinine, Ser: 1.16 mg/dL — ABNORMAL HIGH (ref 0.44–1.00)
GFR calc Af Amer: 53 mL/min — ABNORMAL LOW (ref >60)
GFR calc non Af Amer: 45 mL/min — ABNORMAL LOW (ref >60)
Glucose, Bld: 124 mg/dL — ABNORMAL HIGH (ref 70–99)
Potassium: 4.2 mmol/L (ref 3.5–5.1)
Sodium: 137 mmol/L (ref 135–145)
Total Bilirubin: 1.1 mg/dL (ref 0.3–1.2)
Total Protein: 7.9 g/dL (ref 6.5–8.1)

## 2020-04-16 LAB — CBC
HCT: 47.3 % — ABNORMAL HIGH (ref 36.0–46.0)
Hemoglobin: 15.6 g/dL — ABNORMAL HIGH (ref 12.0–15.0)
MCH: 31.5 pg (ref 26.0–34.0)
MCHC: 33 g/dL (ref 30.0–36.0)
MCV: 95.4 fL (ref 80.0–100.0)
Platelets: 395 10*3/uL (ref 150–400)
RBC: 4.96 MIL/uL (ref 3.87–5.11)
RDW: 13.4 % (ref 11.5–15.5)
WBC: 11.3 10*3/uL — ABNORMAL HIGH (ref 4.0–10.5)
nRBC: 0 % (ref 0.0–0.2)

## 2020-04-16 NOTE — Progress Notes (Addendum)
[facility] Consult: 10:48 AM 04/16/2020  LOS: 1 day    Referring Provider: Dr Louanne Belton  Primary Care Physician:  Patient, No Pcp Per Primary Gastroenterologist:  Althia Forts pt     Reason for Consultation:  Pt refusal to eat.     HPI: Kristin Grimes is a 79 y.o. female.  Relocated with her husband from [place] to [place] a month ago.  Has yet to establish medical care here in [place].  Hx dementia, no meds for this.  Failure to thrive.  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  Surgery resolved previous episodes of abdominal pain, burping and syncope. Review of meds suggest pt has hypercholesterolemia, possible hypertension, anemia (on once daily iron), hypothyroidism on Synthroid.  Depression on Lexapro, ativan 2 weeks ago began complaining of abdominal pain and had syncopal spell.  Episodic bowel and bladder incontinence.  Refusing to take p.o. meds and food/liquids.  Increasingly combative husband concerned for recurrent esophageal hernia given similarity of symptoms.  Patient also has had increasing behavioral health issues which are taxing on her husband.  CT head: Mild atrophy, chronic microvascular ischemic changes.  Layering sphenoid sinus secretions, ?  Active sinus disease.  No acute intracranial process. CXR: BB opacities, likely atx.  Multilevel spondylosis, left Keli note humeral arthritis.  Aortic atherosclerotic calcifications. BA esophagram ordered. WBCs 11.4.  Hb 15.6.  BUN/creatinine 26/1.2.  Lipase, LFTs normal.  Electrolytes normal.  Glucose 124.  No evidence for UTI on urinalysis.  This morning the patient drank water and took her meds without issues.  As I interviewed the patient, she denied abdominal pain, difficulty swallowing,  nausea.  Family wants patient placed in a nursing facility as they cannot manage her at home.  Unable to obtain much in the way of social or family history from the patient.    Past Medical History:  Diagnosis Date  . Hypothyroidism     History reviewed. No pertinent surgical history.  Prior to Admission medications   Medication Sig Start Date End Date Taking? Authorizing Provider  atorvastatin (LIPITOR) 40 MG tablet Take 40 mg by mouth daily.   Yes [provider]  carvedilol (COREG) 3.125 MG tablet Take 3.125 mg by mouth 2 (two) times daily with a meal.   Yes [provider]  cholecalciferol (VITAMIN D3) 25 MCG (1000 UNIT) tablet Take 1,000 Units by mouth daily.   Yes [provider]  escitalopram (LEXAPRO) 20 MG tablet Take 20 mg by mouth daily.   Yes [provider]  ferrous sulfate 324 MG TBEC Take 324 mg by mouth.   Yes [provider]  levothyroxine (SYNTHROID) 112 MCG tablet Take 112 mcg by mouth daily before breakfast.   Yes [provider]  lisinopril (ZESTRIL) 40 MG tablet Take 40 mg by mouth daily.   Yes [provider]  LORazepam (ATIVAN) 0.5 MG tablet Take 0.5 mg by mouth 2 (two) times daily as needed for anxiety or sleep.   Yes [provider]  Multiple Vitamin (MULTIVITAMIN WITH MINERALS) TABS  tablet Take 1 tablet by mouth daily.   Yes [provider]  NIFEdipine (ADALAT CC) 30 MG 24 hr tablet Take 30 mg by mouth daily.   Yes [provider]  ondansetron (ZOFRAN) 8 MG tablet Take 8 mg by mouth every 8 (eight) hours as needed for nausea or vomiting.   Yes [provider]  pantoprazole (PROTONIX) 40 MG tablet Take 40 mg by mouth daily.   Yes [provider]  vitamin B-12 (CYANOCOBALAMIN) 500 MCG tablet Take 500 mcg by mouth daily.   Yes [provider]    Scheduled Meds: . heparin  5,000 Units Subcutaneous Q8H  . ziprasidone  40 mg Oral BID WC    Infusions:  PRN Meds:    Allergies as of 04/15/2020  . (No Known Allergies)    History reviewed. No pertinent family history.  Social History   Socioeconomic History  . Marital status: Married    Spouse name: Not on file  . Number of children: Not on file  . Years of education: Not on file  . Highest education level: Not on file  Occupational History  . Not on file  Tobacco Use  . Smoking status: Former Smoker    Types: Cigarettes  . Smokeless tobacco: Never Used  Substance and Sexual Activity  . Alcohol use: Yes    Comment: occasional  . Drug use: Never  . Sexual activity: Not on file  Other Topics Concern  . Not on file  Social History Narrative  . Not on file   Social Determinants of Health   Financial Resource Strain:   . Difficulty of Paying Living Expenses:   Food Insecurity:   . Worried About Programme researcher, broadcasting/film/video in the Last Year:   . Barista in the Last Year:   Transportation Needs:   . Freight forwarder (Medical):   Marland Kitchen Lack of Transportation (Non-Medical):   Physical Activity:   . Days of Exercise per Week:   . Minutes of Exercise per Session:   Stress:   . Feeling of Stress :   Social Connections:   . Frequency of Communication with Friends and Family:   . Frequency of Social Gatherings with Friends and Family:   . Attends Religious Services:   . Active Member of Clubs or Organizations:   . Attends Banker Meetings:   Marland Kitchen Marital Status:   Intimate Partner Violence:   . Fear of Current or Ex-Partner:   . Emotionally Abused:   Marland Kitchen Physically Abused:   . Sexually Abused:     REVIEW OF SYSTEMS: Constitutional: No mention of patient having weakness or fatigue. ENT:  No nose bleeds Pulm: No mention of difficulty breathing and patient denies this. CV:  No palpitations, no LE edema.  GU:  No hematuria, no frequency GI: See HPI. Heme: No reports of unusual bleeding or bruising. Transfusions: Unknown history. Neuro:   No headaches, no peripheral tingling or numbness.  Details surrounding syncopal spell a couple of weeks ago unclear. Derm:  No itching, no rash or sores.  Endocrine:  No sweats or chills.  No polyuria or dysuria Immunization: Patient does not know if she is received Covid vaccination Travel:  None beyond local counties in last few months.    PHYSICAL EXAM: Vital signs in last 24 hours: Vitals:   04/16/20 0542 04/16/20 0933  BP: (!) 151/87 114/85  Pulse: 74 71  Resp: 17 17  Temp: 98.9 F (37.2 C) (!) 97.5  F (36.4 C)  SpO2: 97% 98%   Wt Readings from Last 3 Encounters:  04/16/20 82.1 kg    General: Physically the patient looks well, quite robust in fact though when she walks she is a little bit teeter tottering on her feet.  She is alert, content but confused. Head: No facial asymmetry or swelling.  No signs of head trauma. Eyes: No scleral icterus or conjunctival pallor. Ears: Not hard of hearing Nose: No discharge Mouth: Mucosa pink, moist, clear.  Tongue midline.  Good dentition Neck: No JVD, no masses, no thyromegaly Lungs: Clear bilaterally Heart: RRR.  No MRG.  S1, S2 present Abdomen: Soft without tenderness, swelling or masses.  No HSM.  Active bowel sounds.  Not distended.   Rectal: Deferred Musc/Skeltl: No joint redness, swelling or gross deformity. Extremities: No CCE. Neurologic: Although she has fluid speech, she is confused and offers non sequitur answers to some of the questions.  The president is "a nice guy".  Cannot tell me what town or place she is in.  Does not recall the year but admits this readily.  Moves all 4 limbs.  Walks in hallway with minimal assistance but has a bit of a tottering gait suggestive of some balance issues. Skin: No telangiectasia, significant purpura or bruising.  No skin tears. Tattoos: None observed Nodes: No cervical adenopathy Psych: Cooperative, follows commands.  Currently calm and animated at times but not  agitated  Intake/Output from previous day: 06/04 0701 - 06/05 0700 In: 400 [IV Piggyback:400] Out: 0  Intake/Output this shift: Total I/O In: 120 [P.O.:120] Out: -   LAB RESULTS: Recent Labs    04/15/20 1142 04/16/20 0646  WBC 11.4* 11.3*  HGB 13.8 15.6*  HCT 42.2 47.3*  PLT 353 395   BMET Lab Results  Component Value Date   NA 137 04/16/2020   NA 138 04/15/2020   K 4.2 04/16/2020   K 3.9 04/15/2020   CL 102 04/16/2020   CL 102 04/15/2020   CO2 23 04/16/2020   CO2 24 04/15/2020   GLUCOSE 124 (H) 04/16/2020   GLUCOSE 102 (H) 04/15/2020   BUN 21 04/16/2020   BUN 26 (H) 04/15/2020   CREATININE 1.16 (H) 04/16/2020   CREATININE 1.27 (H) 04/15/2020   CALCIUM 9.9 04/16/2020   CALCIUM 9.7 04/15/2020   LFT Recent Labs    04/15/20 1142 04/16/20 0646  PROT 7.1 7.9  ALBUMIN 3.9 4.2  AST 25 32  ALT 18 22  ALKPHOS 59 66  BILITOT 1.1 1.1   PT/INR No results found for: INR, PROTIME Hepatitis Panel No results for input(s): HEPBSAG, HCVAB, HEPAIGM, HEPBIGM in the last 72 hours. C-Diff No components found for: CDIFF Lipase     Component Value Date/Time   LIPASE 15 04/15/2020 1142    Drugs of Abuse  No results found for: LABOPIA, COCAINSCRNUR, LABBENZ, AMPHETMU, THCU, LABBARB   RADIOLOGY STUDIES: CT Head Wo Contrast  Result Date: 04/15/2020 CLINICAL DATA:  Altered mental status. EXAM: CT HEAD WITHOUT CONTRAST TECHNIQUE: Contiguous axial images were obtained from the base of the skull through the vertex without intravenous contrast. COMPARISON:  None. FINDINGS: Brain: Mild diffuse cerebral atrophy with ex vacuo dilatation. Scattered and confluent periventricular and deep white matter hypodensities are nonspecific however commonly associated with chronic microvascular ischemic changes. No midline shift. No mass lesion. No extra-axial fluid collection. No acute infarct or intracranial hemorrhage. Motion artifact limits evaluation of the vertex. Vascular: No hyperdense  vessel. Bilateral carotid siphon atherosclerotic calcifications.  Skull: Negative for fracture or focal lesion. Sinuses/Orbits: Normal orbits. Layering bilateral sphenoid sinus secretions. No mastoid effusion. Other: None. IMPRESSION: No acute intracranial process. Mild cerebral atrophy and chronic microvascular ischemic changes. Layering sphenoid sinus secretions may reflect active sinus disease. Electronically Signed   By: Stana Bunting M.D.   On: 04/15/2020 13:40   DG Chest Portable 1 View  Result Date: 04/15/2020 CLINICAL DATA:  Altered mental status. EXAM: PORTABLE CHEST 1 VIEW COMPARISON:  None. FINDINGS: Telemetry wires overlie the upper and left thorax. Bibasilar streaky opacities. No pneumothorax or pleural effusion. Aortic atherosclerotic calcifications. Cardiomediastinal silhouette is within normal limits of size. Multilevel spondylosis and left glenohumeral osteoarthrosis. IMPRESSION: Bibasilar opacities, likely atelectasis. Electronically Signed   By: Stana Bunting M.D.   On: 04/15/2020 13:35     IMPRESSION:   *    Abdominal pain, syncope.  Refusing p.o.  Symptoms similar to those prior to repair of what sounds like a paraesophageal hernia 5 years ago. CXR unrevealing as to cause.  Awaiting barium esophagram  *    Progressive dementia.  Has become combative.  Calm down now after ineffective Haldol and more effective Geodon.  PLAN:     *   Confirmed with radiology that she is on the schedule for esophagram today.  She may not be able to participate for this though she did swallow liquids and meds without issue within the last few hours.  Does not have any overt signs of dysphagia so I suspect she will be able to resume diet after this study but lets wait on results.  *   Check TSH given her mental health issues  Addendum at 1 PM Radiology does not perform nonurgent barium esophagram's over the weekend.  I asked them to reschedule this for Monday. Patient can eat a soft  diet through today and tomorrow and n.p.o. after midnight on Monday to allow for the esophagram.     Jennye Moccasin  04/16/2020, 10:48 AM Phone [phone]   Attending physician's note   I have taken a history, examined the patient and reviewed the chart. I agree with the Advanced Practitioner's note, impression and recommendations. 79 year old female with advanced dementia, GI consulted apparently because patient refused to eat. She tolerated clear liquids, finished her tray for lunch, plan to try soft diet later this afternoon. If she is able to tolerate soft diet, may not need to pursue further diagnostic studies, barium esophagram pending. Do not recommend invasive procedures or endoscopic evaluation at this point  Need to address goals of care  GI will sign off, please call if have any questions  K. Scherry Ran , MD [phone]

## 2020-04-16 NOTE — Progress Notes (Signed)
PROGRESS NOTE  Kristin Grimes GMW:[id number] DOB: 02/13/1941 DOA: 04/15/2020 PCP: Patient, No Pcp Per   LOS: 1 day   Brief narrative: As per HPI,  Kristin Grimes is a 79 y.o. female with unknown medical history other than advanced dementia, questionable esophageal surgery unspecified, who recently moved from [place] with her husband presented to hospital with worsening mental status for the last 4 weeks since the move.  Family was concerned that it was a psychotic issue and the patient has been refusing to take p.o. with prior history of esophageal surgery.  There is some question about the syncopal event over the past few weeks.  Patient was brought into the hospital because of worsening mental status and combativeness with family at home.  In the ED, patient was evaluated. Routine lab work was obtained, patient became markedly combative requiring 2 mg of IV Haldol with minimal to no improvement, subsequently patient was given 40 mg of Geodon p.o. with moderate improvement but ultimately decision was made for one-to-one sitter to be at bedside given patient's ongoing removal of close, IV was pulled, telemetry was ripped off.  Unfortunately husband left in the middle of patient's combative state and would not return to help reorient the patient as he was "worn out."  Patient had routine imaging including CT head given mental status changes and chest x-ray with no overt findings.  Patient's lab work was equally as unimpressive with minimally elevated glucose but otherwise unremarkable for any acute findings.   Assessment/Plan:  Active Problems:   Acute metabolic encephalopathy  Advanced dementia with possible acute encephalopathy/delirium rule out delirium on advanced dementia. No clear indication for infectious process.  Increasing combativeness over the last few weeks. Continue to hold all medications in case polypharmacy.  On one-to-one sitter.  Try clears.  Continue Haldol,  Geodon.    Covid test was negative.  No fever but mild leukocytosis.  Chest x-ray shows bibasilar opacities likely atelectasis.  No acute intracranial process but cerebral atrophy. UA with ketones,no signs of infection.  CT head scan without any acute findings.  Patient's family is requesting placement at a skilled nursing facility/memory care unit.  History of esophageal surgery, unspecified Stable esophageal surgery at [facility] .  With decreased p.o intake at that time with abdominal pain and a single event of syncope.  Refusing p.o. GI was consulted.  Recommend barium swallow.  This could be progressive case of dementia.  Questionable syncopal event Possibly orthostatic given patient's poor p.o intake  Mild elevated creatinine.  Will monitor.  VTE Prophylaxis: Heparin subcu.  Will change to Lovenox  Code Status: Full code  Family Communication: I had a prolonged discussion with the patient's husband and son at bedside.  Patient's family requested a memory care unit placement. Status is: Inpatient  Remains inpatient appropriate because:Unsafe d/c plan, Inpatient level of care appropriate due to severity of illness and Delirium, encephalopathy, combativeness, likely need a skilled nursing facility placement/memory care unit.   Dispo: The patient is from: Home              Anticipated d/c is to: Skilled nursing facility/memory care unit.  Get PT OT evaluation.              Anticipated d/c date is: 2 days              Patient currently is not medically stable to d/c.  Consultants:  GI  Procedures:  None  Antibiotics:  . None  Anti-infectives (From admission, onward)  None     Subjective: Today, patient was seen and examined at bedside.  Patient is confused disoriented but smiling at times.  Sitter at bedside.  Gets easily irritated.  Was not combative at the time of my evaluation.  Objective: Vitals:   04/16/20 0116 04/16/20 0542  BP: 130/73 (!) 151/87  Pulse: 66 74    Resp: 18 17  Temp: 98.9 F (37.2 C) 98.9 F (37.2 C)  SpO2: 97% 97%    Intake/Output Summary (Last 24 hours) at 04/16/2020 0744 Last data filed at 04/16/2020 0542 Gross per 24 hour  Intake 400 ml  Output 0 ml  Net 400 ml   Filed Weights   04/16/20 0116  Weight: 82.1 kg   Body mass index is 30.12 kg/m.   Physical Exam: GENERAL: Patient is alert awake communicative but disoriented, confused has advanced dementia HENT: No scleral pallor or icterus. Pupils equally reactive to light. Oral mucosa is moist NECK: is supple, no gross swelling noted. CHEST: Clear to auscultation. No crackles or wheezes.  Diminished breath sounds bilaterally. CVS: S1 and S2 heard, no murmur. Regular rate and rhythm.  ABDOMEN: Soft, non-tender, bowel sounds are present. EXTREMITIES: No edema. CNS: Alert awake communicative confused, disoriented SKIN: warm and dry without rashes.  Data Review: I have personally reviewed the following laboratory data and studies,  CBC: Recent Labs  Lab 04/15/20 1142 04/16/20 0646  WBC 11.4* 11.3*  NEUTROABS 8.6*  --   HGB 13.8 15.6*  HCT 42.2 47.3*  MCV 96.1 95.4  PLT 353 395   Basic Metabolic Panel: Recent Labs  Lab 04/15/20 1142 04/16/20 0646  NA 138 137  K 3.9 4.2  CL 102 102  CO2 24 23  GLUCOSE 102* 124*  BUN 26* 21  CREATININE 1.27* 1.16*  CALCIUM 9.7 9.9   Liver Function Tests: Recent Labs  Lab 04/15/20 1142 04/16/20 0646  AST 25 32  ALT 18 22  ALKPHOS 59 66  BILITOT 1.1 1.1  PROT 7.1 7.9  ALBUMIN 3.9 4.2   Recent Labs  Lab 04/15/20 1142  LIPASE 15   No results for input(s): AMMONIA in the last 168 hours. Cardiac Enzymes: No results for input(s): CKTOTAL, CKMB, CKMBINDEX, TROPONINI in the last 168 hours. BNP (last 3 results) No results for input(s): BNP in the last 8760 hours.  ProBNP (last 3 results) No results for input(s): PROBNP in the last 8760 hours.  CBG: No results for input(s): GLUCAP in the last 168 hours. Recent  Results (from the past 240 hour(s))  SARS Coronavirus 2 by RT PCR (hospital order, performed in [facility] hospital lab) Nasopharyngeal Nasopharyngeal Swab     Status: None   Collection Time: 04/15/20  2:28 PM   Specimen: Nasopharyngeal Swab  Result Value Ref Range Status   SARS Coronavirus 2 NEGATIVE NEGATIVE Final    Comment: (NOTE) SARS-CoV-2 target nucleic acids are NOT DETECTED. The SARS-CoV-2 RNA is generally detectable in upper and lower respiratory specimens during the acute phase of infection. The lowest concentration of SARS-CoV-2 viral copies this assay can detect is 250 copies / mL. A negative result does not preclude SARS-CoV-2 infection and should not be used as the sole basis for treatment or other patient management decisions.  A negative result may occur with improper specimen collection / handling, submission of specimen other than nasopharyngeal swab, presence of viral mutation(s) within the areas targeted by this assay, and inadequate number of viral copies (<250 copies / mL). A negative result must  be combined with clinical observations, patient history, and epidemiological information. Fact Sheet for Patients:   StrictlyIdeas.no Fact Sheet for Healthcare Providers: BankingDealers.co.za This test is not yet approved or cleared  by the [place] FDA and has been authorized for detection and/or diagnosis of SARS-CoV-2 by FDA under an Emergency Use Authorization (EUA).  This EUA will remain in effect (meaning this test can be used) for the duration of the COVID-19 declaration under Section 564(b)(1) of the Act, 21 U.S.C. section 360bbb-3(b)(1), unless the authorization is terminated or revoked sooner. Performed at [facility], [place] [address], [place], [place] [address]      Studies: CT Head Wo Contrast  Result Date: 04/15/2020 CLINICAL DATA:  Altered mental status. EXAM: CT HEAD WITHOUT  CONTRAST TECHNIQUE: Contiguous axial images were obtained from the base of the skull through the vertex without intravenous contrast. COMPARISON:  None. FINDINGS: Brain: Mild diffuse cerebral atrophy with ex vacuo dilatation. Scattered and confluent periventricular and deep white matter hypodensities are nonspecific however commonly associated with chronic microvascular ischemic changes. No midline shift. No mass lesion. No extra-axial fluid collection. No acute infarct or intracranial hemorrhage. Motion artifact limits evaluation of the vertex. Vascular: No hyperdense vessel. Bilateral carotid siphon atherosclerotic calcifications. Skull: Negative for fracture or focal lesion. Sinuses/Orbits: Normal orbits. Layering bilateral sphenoid sinus secretions. No mastoid effusion. Other: None. IMPRESSION: No acute intracranial process. Mild cerebral atrophy and chronic microvascular ischemic changes. Layering sphenoid sinus secretions may reflect active sinus disease. Electronically Signed   By: Primitivo Gauze M.D.   On: 04/15/2020 13:40   DG Chest Portable 1 View  Result Date: 04/15/2020 CLINICAL DATA:  Altered mental status. EXAM: PORTABLE CHEST 1 VIEW COMPARISON:  None. FINDINGS: Telemetry wires overlie the upper and left thorax. Bibasilar streaky opacities. No pneumothorax or pleural effusion. Aortic atherosclerotic calcifications. Cardiomediastinal silhouette is within normal limits of size. Multilevel spondylosis and left glenohumeral osteoarthrosis. IMPRESSION: Bibasilar opacities, likely atelectasis. Electronically Signed   By: Primitivo Gauze M.D.   On: 04/15/2020 13:35     Flora Lipps, MD  [facility] 04/16/2020

## 2020-04-16 NOTE — ED Notes (Signed)
ED TO INPATIENT HANDOFF REPORT  Name/Age/Gender Kristin Grimes 79 y.o. female  Code Status    Code Status Orders  (From admission, onward)         Start     Ordered   04/15/20 1508  Full code  Continuous     04/15/20 1507        Code Status History    This patient has a current code status but no historical code status.   Advance Care Planning Activity      Home/SNF/Other Home  Chief Complaint Acute metabolic encephalopathy [G93.41]  Level of Care/Admitting Diagnosis ED Disposition    ED Disposition Condition Comment   Admit  Hospital Area: [facility] [[id number]]  Level of Care: Med-Surg [16]  May admit patient to [facility] or [facility] if equivalent level of care is available:: Yes  Covid Evaluation: Asymptomatic Screening Protocol (No Symptoms)  Diagnosis: Acute metabolic encephalopathy [[id number]]  Admitting Physician: Azucena Fallen [[id number]]  Attending Physician: Azucena Fallen [[id number]]  Estimated length of stay: past midnight tomorrow  Certification:: I certify this patient will need inpatient services for at least 2 midnights       Medical History Past Medical History:  Diagnosis Date  . Hypothyroidism     Allergies No Known Allergies  IV Location/Drains/Wounds Patient Lines/Drains/Airways Status   Active Line/Drains/Airways    None          Labs/Imaging Results for orders placed or performed during the hospital encounter of 04/15/20 (from the past 48 hour(s))  CBC with Differential     Status: Abnormal   Collection Time: 04/15/20 11:42 AM  Result Value Ref Range   WBC 11.4 (H) 4.0 - 10.5 K/uL   RBC 4.39 3.87 - 5.11 MIL/uL   Hemoglobin 13.8 12.0 - 15.0 g/dL   HCT 86.7 67.2 - 09.4 %   MCV 96.1 80.0 - 100.0 fL   MCH 31.4 26.0 - 34.0 pg   MCHC 32.7 30.0 - 36.0 g/dL   RDW 70.9 62.8 - 36.6 %   Platelets 353 150 - 400 K/uL   nRBC 0.0 0.0 - 0.2 %   Neutrophils Relative % 75 %   Neutro Abs 8.6 (H) 1.7  - 7.7 K/uL   Lymphocytes Relative 11 %   Lymphs Abs 1.3 0.7 - 4.0 K/uL   Monocytes Relative 12 %   Monocytes Absolute 1.4 (H) 0.1 - 1.0 K/uL   Eosinophils Relative 1 %   Eosinophils Absolute 0.1 0.0 - 0.5 K/uL   Basophils Relative 1 %   Basophils Absolute 0.1 0.0 - 0.1 K/uL   Immature Granulocytes 0 %   Abs Immature Granulocytes 0.03 0.00 - 0.07 K/uL    Comment: Performed at [facility], [address] [place], [place] [address]  Comprehensive metabolic panel     Status: Abnormal   Collection Time: 04/15/20 11:42 AM  Result Value Ref Range   Sodium 138 135 - 145 mmol/L   Potassium 3.9 3.5 - 5.1 mmol/L   Chloride 102 98 - 111 mmol/L   CO2 24 22 - 32 mmol/L   Glucose, Bld 102 (H) 70 - 99 mg/dL    Comment: Glucose reference range applies only to samples taken after fasting for at least 8 hours.   BUN 26 (H) 8 - 23 mg/dL   Creatinine, Ser 5.46 (H) 0.44 - 1.00 mg/dL   Calcium 9.7 8.9 - 50.3 mg/dL   Total Protein 7.1 6.5 - 8.1 g/dL  Albumin 3.9 3.5 - 5.0 g/dL   AST 25 15 - 41 U/L   ALT 18 0 - 44 U/L   Alkaline Phosphatase 59 38 - 126 U/L   Total Bilirubin 1.1 0.3 - 1.2 mg/dL   GFR calc non Af Amer 41 (L) >60 mL/min   GFR calc Af Amer 47 (L) >60 mL/min   Anion gap 12 5 - 15    Comment: Performed at [facility], [place] [address] [place], [place] [address]  Lipase, blood     Status: None   Collection Time: 04/15/20 11:42 AM  Result Value Ref Range   Lipase 15 11 - 51 U/L    Comment: Performed at [facility], [place] [address], [place], [place] [address]  SARS Coronavirus 2 by RT PCR (hospital order, performed in [facility] hospital lab) Nasopharyngeal Nasopharyngeal Swab     Status: None   Collection Time: 04/15/20  2:28 PM   Specimen: Nasopharyngeal Swab  Result Value Ref Range   SARS Coronavirus 2 NEGATIVE NEGATIVE    Comment: (NOTE) SARS-CoV-2 target nucleic acids are NOT DETECTED. The SARS-CoV-2 RNA is generally  detectable in upper and lower respiratory specimens during the acute phase of infection. The lowest concentration of SARS-CoV-2 viral copies this assay can detect is 250 copies / mL. A negative result does not preclude SARS-CoV-2 infection and should not be used as the sole basis for treatment or other patient management decisions.  A negative result may occur with improper specimen collection / handling, submission of specimen other than nasopharyngeal swab, presence of viral mutation(s) within the areas targeted by this assay, and inadequate number of viral copies (<250 copies / mL). A negative result must be combined with clinical observations, patient history, and epidemiological information. Fact Sheet for Patients:   StrictlyIdeas.no Fact Sheet for Healthcare Providers: BankingDealers.co.za This test is not yet approved or cleared  by the [place] FDA and has been authorized for detection and/or diagnosis of SARS-CoV-2 by FDA under an Emergency Use Authorization (EUA).  This EUA will remain in effect (meaning this test can be used) for the duration of the COVID-19 declaration under Section 564(b)(1) of the Act, 21 U.S.C. section 360bbb-3(b)(1), unless the authorization is terminated or revoked sooner. Performed at [facility], [place] [address], [place], [place] [address]   Urinalysis, Routine w reflex microscopic     Status: Abnormal   Collection Time: 04/15/20  8:49 PM  Result Value Ref Range   Color, Urine YELLOW YELLOW   APPearance CLEAR CLEAR   Specific Gravity, Urine 1.025 1.005 - 1.030   pH 5.0 5.0 - 8.0   Glucose, UA NEGATIVE NEGATIVE mg/dL   Hgb urine dipstick MODERATE (A) NEGATIVE   Bilirubin Urine NEGATIVE NEGATIVE   Ketones, ur 20 (A) NEGATIVE mg/dL   Protein, ur 100 (A) NEGATIVE mg/dL   Nitrite NEGATIVE NEGATIVE   Leukocytes,Ua NEGATIVE NEGATIVE   RBC / HPF 6-10 0 - 5 RBC/hpf   WBC, UA 0-5 0  - 5 WBC/hpf   Bacteria, UA NONE SEEN NONE SEEN   Squamous Epithelial / LPF 0-5 0 - 5   Mucus PRESENT     Comment: Performed at [facility], [place] [address], [place], [place] [address]   CT Head Wo Contrast  Result Date: 04/15/2020 CLINICAL DATA:  Altered mental status. EXAM: CT HEAD WITHOUT CONTRAST TECHNIQUE: Contiguous axial images were obtained from the base of the skull through the vertex without intravenous  contrast. COMPARISON:  None. FINDINGS: Brain: Mild diffuse cerebral atrophy with ex vacuo dilatation. Scattered and confluent periventricular and deep white matter hypodensities are nonspecific however commonly associated with chronic microvascular ischemic changes. No midline shift. No mass lesion. No extra-axial fluid collection. No acute infarct or intracranial hemorrhage. Motion artifact limits evaluation of the vertex. Vascular: No hyperdense vessel. Bilateral carotid siphon atherosclerotic calcifications. Skull: Negative for fracture or focal lesion. Sinuses/Orbits: Normal orbits. Layering bilateral sphenoid sinus secretions. No mastoid effusion. Other: None. IMPRESSION: No acute intracranial process. Mild cerebral atrophy and chronic microvascular ischemic changes. Layering sphenoid sinus secretions may reflect active sinus disease. Electronically Signed   By: Stana Bunting M.D.   On: 04/15/2020 13:40   DG Chest Portable 1 View  Result Date: 04/15/2020 CLINICAL DATA:  Altered mental status. EXAM: PORTABLE CHEST 1 VIEW COMPARISON:  None. FINDINGS: Telemetry wires overlie the upper and left thorax. Bibasilar streaky opacities. No pneumothorax or pleural effusion. Aortic atherosclerotic calcifications. Cardiomediastinal silhouette is within normal limits of size. Multilevel spondylosis and left glenohumeral osteoarthrosis. IMPRESSION: Bibasilar opacities, likely atelectasis. Electronically Signed   By: Stana Bunting M.D.   On: 04/15/2020 13:35    Pending  Labs Unresulted Labs (From admission, onward)    Start     Ordered   04/16/20 0500  Comprehensive metabolic panel  Tomorrow morning,   R     04/15/20 1507   04/16/20 0500  CBC  Tomorrow morning,   R     04/15/20 1507          Vitals/Pain Today's Vitals   04/15/20 2045 04/15/20 2115 04/15/20 2245 04/16/20 0014  BP:    (!) 149/85  Pulse: 75 70 72 70  Resp:    16  Temp:      TempSrc:      SpO2: 98% 97% 95% 94%    Isolation Precautions No active isolations  Medications Medications  ziprasidone (GEODON) capsule 40 mg (40 mg Oral Given 04/15/20 1536)  heparin injection 5,000 Units (5,000 Units Subcutaneous Given 04/15/20 2201)  haloperidol lactate (HALDOL) injection 2 mg (2 mg Intravenous Given 04/15/20 1227)  sodium chloride 0.9 % bolus 1,000 mL (0 mLs Intravenous Stopped 04/15/20 1400)    Mobility non-ambulatory

## 2020-04-16 NOTE — Evaluation (Signed)
Physical Therapy Evaluation Patient Details Name: Kristin Grimes MRN: [id number] DOB: 1941/09/28 Today's Date: 04/16/2020   History of Present Illness  79 yo female admitted with AMS, FTT. Family brought pt to ED-husband unable to care for pt. Hx of dementia, scoliosis, esophageal hernia.  Clinical Impression  On eval, pt required Min assist for mobility. She walked ~50 feet in the hallway. Distance was limited by fatigue, weakness. She is unsteady and required assistance from therapist throughout ambulation distance. She was pleasant and followed 1 step commands well. No family present during session. Pt presents with general weakness, decreased activity tolerance, and impaired gait and balance. Recommend SNF rehab. Will follow and progress activity as tolerated.     Follow Up Recommendations SNF    Equipment Recommendations  None recommended by PT    Recommendations for Other Services OT consult     Precautions / Restrictions Precautions Precautions: Fall Restrictions Weight Bearing Restrictions: No      Mobility  Bed Mobility               General bed mobility comments: oob in recliner (with sitter present)  Transfers Overall transfer level: Needs assistance Equipment used: 1 person hand held assist Transfers: Sit to/from Stand Sit to Stand: Min assist         General transfer comment: Cues required. Assist to power up, stabilize.  Ambulation/Gait Ambulation/Gait assistance: Min assist Gait Distance (Feet): 50 Feet Assistive device: 1 person hand held assist Gait Pattern/deviations: Decreased stride length;Step-through pattern     General Gait Details: Unsteady. Therapist provided steadying assistance throughout distance. Pt can get distracted by environment but she is easily redirected. Distance limited by fatigue, weakness.  Stairs            Wheelchair Mobility    Modified Rankin (Stroke Patients Only)       Balance Overall balance  assessment: Needs assistance           Standing balance-Leahy Scale: Fair                               Pertinent Vitals/Pain Pain Assessment: Faces Faces Pain Scale: No hurt    Home Living Family/patient expects to be discharged to:: Unsure Living Arrangements: Spouse/significant other Available Help at Discharge: Family Type of Home: House                Prior Function Level of Independence: Needs assistance   Gait / Transfers Assistance Needed: ambulatory per chart     Comments: pt unable to provide info. no family available at time of eval.     Hand Dominance        Extremity/Trunk Assessment   Upper Extremity Assessment Upper Extremity Assessment: Defer to OT evaluation    Lower Extremity Assessment Lower Extremity Assessment: Generalized weakness    Cervical / Trunk Assessment Cervical / Trunk Assessment: Kyphotic  Communication      Cognition Arousal/Alertness: Awake/alert Behavior During Therapy: WFL for tasks assessed/performed Overall Cognitive Status: History of cognitive impairments - at baseline                                 General Comments: pt A&O x 1. pleasant. follows 1 step commands.      General Comments      Exercises     Assessment/Plan    PT Assessment Patient needs continued PT services  PT Problem List Decreased strength;Decreased mobility;Decreased safety awareness;Decreased cognition;Decreased balance;Decreased activity tolerance;Decreased knowledge of use of DME       PT Treatment Interventions Therapeutic activities;Therapeutic exercise;Patient/family education;Balance training;Functional mobility training;Gait training    PT Goals (Current goals can be found in the Care Plan section)  Acute Rehab PT Goals Patient Stated Goal: none stated PT Goal Formulation: Patient unable to participate in goal setting Time For Goal Achievement: 04/30/20 Potential to Achieve Goals: Fair     Frequency Min 3X/week   Barriers to discharge        Co-evaluation               AM-PAC PT "6 Clicks" Mobility  Outcome Measure Help needed turning from your back to your side while in a flat bed without using bedrails?: A Little Help needed moving from lying on your back to sitting on the side of a flat bed without using bedrails?: A Little Help needed moving to and from a bed to a chair (including a wheelchair)?: A Little Help needed standing up from a chair using your arms (e.g., wheelchair or bedside chair)?: A Little Help needed to walk in hospital room?: A Little Help needed climbing 3-5 steps with a railing? : A Lot 6 Click Score: 17    End of Session Equipment Utilized During Treatment: Gait belt Activity Tolerance: Patient tolerated treatment well Patient left: in chair;with call bell/phone within reach;with nursing/sitter in room   PT Visit Diagnosis: Muscle weakness (generalized) (M62.81);Unsteadiness on feet (R26.81)    Time: 7544-9201 PT Time Calculation (min) (ACUTE ONLY): 12 min   Charges:   PT Evaluation $PT Eval Low Complexity: 1 Low            Buell [employer] P, PT Acute Rehabilitation

## 2020-04-16 NOTE — Evaluation (Signed)
Occupational Therapy Evaluation Patient Details Name: Kristin Grimes MRN: [id number] DOB: Oct 04, 1941 Today's Date: 04/16/2020    History of Present Illness 79 yo female admitted with AMS, FTT. Family brought pt to ED-husband unable to care for pt. Hx of dementia, scoliosis, esophageal hernia.   Clinical Impression   Kristin Grimes is a 79 year old woman admitted to hospital with altered mental status. ON evaluation patient presents with confusion, generalized weakness and decreased balance resulting in needing assistance for sit to stands, safe ambulation and ADLs. Patient will benefit from skilled OT services to improve deficits and return to PLOF. Family wants patient to go to rehab to maximize potential prior to returning home.    Follow Up Recommendations  SNF    Equipment Recommendations  None recommended by OT    Recommendations for Other Services       Precautions / Restrictions Precautions Precautions: Fall Precaution Comments: hx of bilateral knee pain - needs replacements per spouse Restrictions Weight Bearing Restrictions: No      Mobility Bed Mobility Overal bed mobility: Needs Assistance Bed Mobility: Supine to Sit     Supine to sit: Min assist     General bed mobility comments: min assist for hand hold to and verbal cues needed to transfer to side of bed.  Transfers Overall transfer level: Needs assistance   Transfers: Sit to/from Stand Sit to Stand: Min assist         General transfer comment: Cues required. Assist to power up, stabilize.    Balance   Sitting-balance support: Feet unsupported;No upper extremity supported Sitting balance-Leahy Scale: Normal     Standing balance support: Single extremity supported Standing balance-Leahy Scale: Fair Standing balance comment: improved gait with hand hold                           ADL either performed or assessed with clinical judgement   ADL Overall ADL's : Needs  assistance/impaired Eating/Feeding: Set up   Grooming: Set up;Cueing for sequencing;Supervision/safety;Sitting   Upper Body Bathing: Set up;Cueing for sequencing;Supervision/ safety;Sitting   Lower Body Bathing: Minimal assistance;Set up;Cueing for safety;Cueing for sequencing;Sit to/from stand   Upper Body Dressing : Set up;Sitting;Minimal assistance;Supervision/safety;Cueing for sequencing   Lower Body Dressing: Sit to/from stand;Set up;Cueing for sequencing;Minimal assistance   Toilet Transfer: Minimal assistance;Ambulation;Regular Museum/gallery exhibitions officer and Hygiene: Min guard;Sit to/from stand;Minimal assistance;Cueing for sequencing   Tub/ Shower Transfer: Min guard;Shower seat   Functional mobility during ADLs: Min guard General ADL Comments: min guard for ambulation. min assist for standing from low surface.     Vision   Vision Assessment?: No apparent visual deficits     Perception     Praxis      Pertinent Vitals/Pain Pain Assessment: Faces Faces Pain Scale: No hurt     Hand Dominance Right   Extremity/Trunk Assessment Upper Extremity Assessment Upper Extremity Assessment: Overall WFL for tasks assessed   Lower Extremity Assessment Lower Extremity Assessment: Defer to PT evaluation   Cervical / Trunk Assessment Cervical / Trunk Assessment: Normal   Communication     Cognition Arousal/Alertness: Awake/alert Behavior During Therapy: Restless Overall Cognitive Status: History of cognitive impairments - at baseline                                 General Comments: dementia, reports agitated behavior and sleeplessness at night recently. Today  patient able to state her name, doesn't know her birthday but knows her husband's. Patient demonstrating perseverating behavior.   General Comments       Exercises     Shoulder Instructions      Home Living Family/patient expects to be discharged to:: Private residence Living  Arrangements: Spouse/significant other Available Help at Discharge: Family Type of Home: Apartment Home Access: Elevator     Home Layout: One level     Bathroom Shower/Tub: Producer, television/film/video: Standard Bathroom Accessibility: No   Home Equipment: Toilet riser;Walker - 2 wheels   Additional Comments: has a walker but she doesn't use it      Prior Functioning/Environment Level of Independence: Needs assistance  Gait / Transfers Assistance Needed: ambualates without device ADL's / Homemaking Assistance Needed: needs assistance with IADLs. Spouse reports she can go to the bathroom typically and perform toileting, needs clothes handed to her and verbal cues to perform.            OT Problem List: Impaired balance (sitting and/or standing);Decreased cognition;Decreased activity tolerance      OT Treatment/Interventions: Self-care/ADL training;Patient/family education;Therapeutic activities    OT Goals(Current goals can be found in the care plan section) Acute Rehab OT Goals Patient Stated Goal: improve ability to perform daily tasks OT Goal Formulation: With family Time For Goal Achievement: 04/30/20 Potential to Achieve Goals: Fair  OT Frequency: Min 2X/week   Barriers to D/C: Decreased caregiver support          Co-evaluation              AM-PAC OT "6 Clicks" Daily Activity     Outcome Measure Help from another person eating meals?: A Little Help from another person taking care of personal grooming?: A Little Help from another person toileting, which includes using toliet, bedpan, or urinal?: A Little Help from another person bathing (including washing, rinsing, drying)?: A Little Help from another person to put on and taking off regular upper body clothing?: A Little Help from another person to put on and taking off regular lower body clothing?: A Little 6 Click Score: 18   End of Session Equipment Utilized During Treatment: Gait belt Nurse  Communication: Mobility status  Activity Tolerance: Treatment limited secondary to agitation Patient left: in bed;with call bell/phone within reach;with nursing/sitter in room;with family/visitor present  OT Visit Diagnosis: Unsteadiness on feet (R26.81)                Time: 1340-1402 OT Time Calculation (min): 22 min Charges:  OT General Charges $OT Visit: 1 Visit OT Evaluation $OT Eval Low Complexity: 1 Low  Kacee Sukhu, OTR/L Acute Care Rehab Services  Office [phone]   Kelli Churn 04/16/2020, 3:52 PM

## 2020-04-16 NOTE — Plan of Care (Signed)
Plan of care initiated.

## 2020-04-17 DIAGNOSIS — R1084 Generalized abdominal pain: Secondary | ICD-10-CM

## 2020-04-17 DIAGNOSIS — K449 Diaphragmatic hernia without obstruction or gangrene: Secondary | ICD-10-CM

## 2020-04-17 LAB — BASIC METABOLIC PANEL
Anion gap: 13 (ref 5–15)
BUN: 20 mg/dL (ref 8–23)
CO2: 24 mmol/L (ref 22–32)
Calcium: 9.8 mg/dL (ref 8.9–10.3)
Chloride: 100 mmol/L (ref 98–111)
Creatinine, Ser: 1.17 mg/dL — ABNORMAL HIGH (ref 0.44–1.00)
GFR calc Af Amer: 52 mL/min — ABNORMAL LOW (ref >60)
GFR calc non Af Amer: 45 mL/min — ABNORMAL LOW (ref >60)
Glucose, Bld: 130 mg/dL — ABNORMAL HIGH (ref 70–99)
Potassium: 3.6 mmol/L (ref 3.5–5.1)
Sodium: 137 mmol/L (ref 135–145)

## 2020-04-17 LAB — CBC
HCT: 44.1 % (ref 36.0–46.0)
Hemoglobin: 14.4 g/dL (ref 12.0–15.0)
MCH: 31.3 pg (ref 26.0–34.0)
MCHC: 32.7 g/dL (ref 30.0–36.0)
MCV: 95.9 fL (ref 80.0–100.0)
Platelets: 360 10*3/uL (ref 150–400)
RBC: 4.6 MIL/uL (ref 3.87–5.11)
RDW: 13.7 % (ref 11.5–15.5)
WBC: 7.2 10*3/uL (ref 4.0–10.5)
nRBC: 0 % (ref 0.0–0.2)

## 2020-04-17 LAB — T4, FREE: Free T4: 0.54 ng/dL — ABNORMAL LOW (ref 0.61–1.12)

## 2020-04-17 LAB — TSH: TSH: 82.542 u[IU]/mL — ABNORMAL HIGH (ref 0.350–4.500)

## 2020-04-17 LAB — VITAMIN B12: Vitamin B-12: 2687 pg/mL — ABNORMAL HIGH (ref 180–914)

## 2020-04-17 LAB — MAGNESIUM: Magnesium: 2.1 mg/dL (ref 1.7–2.4)

## 2020-04-17 MED ORDER — ESCITALOPRAM OXALATE 20 MG PO TABS
20.0000 mg | ORAL_TABLET | Freq: Every day | ORAL | Status: DC
  Administered 2020-04-17 – 2020-05-08 (×22): 20 mg via ORAL
  Filled 2020-04-17 (×23): qty 1

## 2020-04-17 MED ORDER — CARVEDILOL 3.125 MG PO TABS
3.1250 mg | ORAL_TABLET | Freq: Two times a day (BID) | ORAL | Status: DC
  Administered 2020-04-17 – 2020-05-06 (×33): 3.125 mg via ORAL
  Filled 2020-04-17 (×35): qty 1

## 2020-04-17 MED ORDER — NIFEDIPINE ER OSMOTIC RELEASE 30 MG PO TB24
30.0000 mg | ORAL_TABLET | Freq: Every day | ORAL | Status: DC
  Administered 2020-04-17 – 2020-04-19 (×3): 30 mg via ORAL
  Filled 2020-04-17 (×5): qty 1

## 2020-04-17 MED ORDER — LEVOTHYROXINE SODIUM 112 MCG PO TABS
112.0000 ug | ORAL_TABLET | Freq: Every day | ORAL | Status: DC
  Administered 2020-04-18 – 2020-04-26 (×9): 112 ug via ORAL
  Filled 2020-04-17 (×9): qty 1

## 2020-04-17 MED ORDER — LISINOPRIL 20 MG PO TABS
40.0000 mg | ORAL_TABLET | Freq: Every day | ORAL | Status: DC
  Administered 2020-04-17 – 2020-04-19 (×3): 40 mg via ORAL
  Filled 2020-04-17 (×3): qty 2

## 2020-04-17 MED ORDER — PANTOPRAZOLE SODIUM 40 MG PO TBEC
40.0000 mg | DELAYED_RELEASE_TABLET | Freq: Every day | ORAL | Status: DC
  Administered 2020-04-17 – 2020-05-08 (×22): 40 mg via ORAL
  Filled 2020-04-17 (×23): qty 1

## 2020-04-17 MED ORDER — ENOXAPARIN SODIUM 40 MG/0.4ML ~~LOC~~ SOLN
40.0000 mg | SUBCUTANEOUS | Status: DC
  Administered 2020-04-17 – 2020-04-19 (×3): 40 mg via SUBCUTANEOUS
  Filled 2020-04-17 (×3): qty 0.4

## 2020-04-17 NOTE — TOC Initial Note (Signed)
Transition of Care [facility]) - Initial/Assessment Note    Patient Details  Name: Kristin Grimes MRN: [id number] Date of Birth: 1941/05/01  [facility]) CM/SW Contact:    Ova Freshwater Phone Number: [phone] 04/17/2020, 4:57 PM  Clinical Narrative:                  Patient presents to [facility] w/ altered mental status.  PT and OT recommneded SNF placement. CSW spoke with patient's son Dalia Heading [phone], main contact but not POA.  CSW informed Ms. Hilsenrand about SNF placement process, long term memory care placement, Medicaid application and Medicare SNF approval.  CSW completed assessment but was not able to complete PASRR submission for [facility] because credentials are for [facility].  Fl2 needs to be completed.  No prefer SNF placement but would like to look for SNF in [place], [place] and [place]. Mr. Wynetta Fines states mother will need placement in facility w/ long term memory care.  Pt is oriented X2, does not participate in ADLs, incontinent and needs assstance with ambulation.  Husband assists patient w/ all ADLs, including bathing, cooking, cleaning and grocery shopping.   Expected Discharge Plan: Skilled Nursing Facility Barriers to Discharge: SNF Pending bed offer   Patient Goals and CMS Choice Patient states their goals for this hospitalization and ongoing recovery are:: SNF and then a long term care memory care facility. CMS Medicare.gov Compare Post Acute Care list provided to:: Patient Represenative (must comment)(Jason Kleinman [phone],ZJI) Choice offered to / list presented to : Adult Children  Expected Discharge Plan and Services Expected Discharge Plan: [place] In-house Referral: Clinical Social Work Discharge Planning Services: CM Consult Post Acute Care Choice: [place] arrangements for the past 2 months: [place]                                      Prior Living  Arrangements/Services Living arrangements for the past 2 months: Single Family Home Lives with:: Spouse Patient language and need for interpreter reviewed:: Yes Do you feel safe going back to the place where you live?: No   Patient has diagnosis of dementia increased difficult to care for self.  Need for Family Participation in Patient Care: Yes (Comment) Care giver support system in place?: Yes (comment) Current home services: DME(rolling walker) Criminal Activity/Legal Involvement Pertinent to Current Situation/Hospitalization: No - Comment as needed  Activities of Daily Living Home Assistive Devices/Equipment: None ADL Screening (condition at time of admission) Patient's cognitive ability adequate to safely complete daily activities?: No Is the patient deaf or have difficulty hearing?: No Does the patient have difficulty seeing, even when wearing glasses/contacts?: No Does the patient have difficulty concentrating, remembering, or making decisions?: Yes Patient able to express need for assistance with ADLs?: Yes Does the patient have difficulty dressing or bathing?: Yes Independently performs ADLs?: No Communication: Independent Dressing (OT): Needs assistance Is this a change from baseline?: Change from baseline, expected to last >3 days Grooming: Needs assistance Is this a change from baseline?: Change from baseline, expected to last >3 days Feeding: Needs assistance Is this a change from baseline?: Change from baseline, expected to last >3 days Bathing: Needs assistance Is this a change from baseline?: Change from baseline, expected to last >3 days Toileting: Needs assistance Is this a change from baseline?: Change from baseline, expected to last >3days In/Out Bed: Needs assistance Is  this a change from baseline?: Change from baseline, expected to last >3 days Walks in Home: Needs assistance Is this a change from baseline?: Change from baseline, expected to last >3 days Does  the patient have difficulty walking or climbing stairs?: Yes Weakness of Legs: Both Weakness of Arms/Hands: None  Permission Sought/Granted Permission sought to share information with : Family Supports Permission granted to share information with : Yes, Verbal Permission Granted  Share Information with NAME: Melenie Minniear, Analya Louissaint  Permission granted to share info w AGENCY: SNF  Permission granted to share info w Relationship: son  Permission granted to share info w Contact Information: Johnnye Sima [phone]  Emotional Assessment Appearance:: Appears stated age Attitude/Demeanor/Rapport: Complaining Affect (typically observed): Anxious Orientation: : Oriented to Self, Fluctuating Orientation (Suspected and/or reported Sundowners) Alcohol / Substance Use: Not Applicable Psych Involvement: Yes (comment)  Admission diagnosis:  Generalized abdominal pain [R10.84] 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 Altered mental status   . Failure to thrive in adult   . Dysphagia   . Acute metabolic encephalopathy 04/15/2020   PCP:  Patient, No Pcp Per Pharmacy:   [id number] [place], [place] - [address] AT [facility] [address] [facility] [address] [facility] [address] [place] [phone] Phone: [phone] Fax: [phone]     Social Determinants of Health (SDOH) Interventions    Readmission Risk Interventions No flowsheet data found.

## 2020-04-17 NOTE — Progress Notes (Signed)
PROGRESS NOTE  Kristin Grimes WLN:[id number] DOB: 21-Jul-1941 DOA: 04/15/2020 PCP: Patient, No Pcp Per   LOS: 2 days   Brief narrative: As per HPI,  Kristin Grimes is a 79 y.o. female with unknown medical history other than advanced dementia, questionable esophageal surgery unspecified, who recently moved from [place] with her husband presented to hospital with worsening mental status for the last 4 weeks since the move.  Family was concerned that it was a psychotic issue and the patient has been refusing to take p.o. with prior history of esophageal surgery.  There is some question about the syncopal event over the past few weeks.  Patient was brought into the hospital because of worsening mental status and combativeness with family at home.    In the ED, patient was evaluated. Routine lab work was obtained, patient became markedly combative requiring 2 mg of IV Haldol with minimal to no improvement, subsequently patient was given 40 mg of Geodon p.o. with moderate improvement but ultimately decision was made for one-to-one sitter to be at bedside given patient's ongoing removal of close, IV was pulled, telemetry was ripped off.  Unfortunately husband left in the middle of patient's combative state and would not return to help reorient the patient as he was "worn out."  Patient had routine imaging including CT head given mental status changes and chest x-ray with no overt findings.  Patient's lab work was equally as unimpressive with minimally elevated glucose but otherwise unremarkable for any acute findings.   Assessment/Plan:  Active Problems:   Acute metabolic encephalopathy   Altered mental status   Failure to thrive in adult   Dysphagia  Advanced dementia with behavioral issues.   No clear indication for infectious process. Increasing combativeness over the last few weeks. Continue to hold all medications in case polypharmacy.  On one-to-one sitter.  Try clears.  Continue  Haldol, Geodon.    Covid test was negative.  No fever but mild leukocytosis.  Chest x-ray shows bibasilar opacities, likely atelectasis.  No acute intracranial process but cerebral atrophy. UA with ketones,no signs of infection.  CT head scan without any acute findings.    Vitamin B12 was elevated.  We will hold vitamin B-12 supplements from home. Restart lexapro. Family requesting SNF  History of esophageal surgery, unspecified History of esophageal [facility] .   Refusing p.o. GI was consulted.  Recommend barium swallow.  This could be progressive case of dementia.  Likely associated with esophagram tomorrow.  N.p.o. after midnight.  Questionable syncopal event Possibly orthostatic given patient's poor p.o intake.  Difficult to assess due to her condition.  Mild elevated creatinine.  Will monitor.  Creatinine of 1.1 today has remained stable.  Essential hypertension.  Patient is on nifedipine, lisinopril and Coreg.  Will restart.  Will closely monitor.  Hypothyroidism.  On Synthroid at home.  Likely that she was noncompliant.  TSH of 80.  Will resume Synthroid.  Check free T3-T4.  On Synthroid 112 mcg at home.  VTE Prophylaxis: Lovenox subcu.  Code Status: Full code  Family Communication:   None today. I had a prolonged discussion with the patient's husband and son at bedside.  Patient's family requested a memory care unit placement.  Status is: Inpatient  Remains inpatient appropriate because:Unsafe d/c plan, Inpatient level of care appropriate due to severity of illness and advanced dementia with behavioral changes, likely need a skilled nursing facility placement/memory care unit.   Dispo: The patient is from: Home  Anticipated d/c is to: Skilled nursing facility/memory care unit.  Physical therapy recommend skilled nursing facility placement on discharge. TOC consulted.              Anticipated d/c date is: 2 days              Patient currently is not medically  stable to d/c.  Consultants:  GI  Procedures:  None  Antibiotics:  . None  Anti-infectives (From admission, onward)   None     Subjective: Today, sitter at bedside. Irritable at times, crying at times.   Objective: Vitals:   04/17/20 0052 04/17/20 0530  BP: (!) 157/94 136/82  Pulse: 75 68  Resp: 18 16  Temp: 97.7 F (36.5 C) 98.7 F (37.1 C)  SpO2: 100% 98%    Intake/Output Summary (Last 24 hours) at 04/17/2020 0715 Last data filed at 04/17/2020 0200 Gross per 24 hour  Intake 1180 ml  Output 550 ml  Net 630 ml   Filed Weights   04/16/20 0116  Weight: 82.1 kg   Body mass index is 30.12 kg/m.   Physical Exam:  GENERAL: Patient is alert awake communicative but disoriented, confused has advanced dementia, appears depressed and tearful today. HENT: No scleral pallor or icterus. Pupils equally reactive to light. Oral mucosa is moist NECK: is supple, no gross swelling noted. CHEST: Clear to auscultation. No crackles or wheezes.  Diminished breath sounds bilaterally. CVS: S1 and S2 heard, no murmur. Regular rate and rhythm.  ABDOMEN: Soft, non-tender, bowel sounds are present. EXTREMITIES: No edema. CNS: Alert awake communicative confused, disoriented, tearful SKIN: warm and dry without rashes.  Data Review: I have personally reviewed the following laboratory data and studies,  CBC: Recent Labs  Lab 04/15/20 1142 04/16/20 0646 04/17/20 0401  WBC 11.4* 11.3* 7.2  NEUTROABS 8.6*  --   --   HGB 13.8 15.6* 14.4  HCT 42.2 47.3* 44.1  MCV 96.1 95.4 95.9  PLT 353 395 360   Basic Metabolic Panel: Recent Labs  Lab 04/15/20 1142 04/16/20 0646 04/17/20 0401  NA 138 137 137  K 3.9 4.2 3.6  CL 102 102 100  CO2 24 23 24   GLUCOSE 102* 124* 130*  BUN 26* 21 20  CREATININE 1.27* 1.16* 1.17*  CALCIUM 9.7 9.9 9.8  MG  --   --  2.1   Liver Function Tests: Recent Labs  Lab 04/15/20 1142 04/16/20 0646  AST 25 32  ALT 18 22  ALKPHOS 59 66  BILITOT 1.1  1.1  PROT 7.1 7.9  ALBUMIN 3.9 4.2   Recent Labs  Lab 04/15/20 1142  LIPASE 15   No results for input(s): AMMONIA in the last 168 hours. Cardiac Enzymes: No results for input(s): CKTOTAL, CKMB, CKMBINDEX, TROPONINI in the last 168 hours. BNP (last 3 results) No results for input(s): BNP in the last 8760 hours.  ProBNP (last 3 results) No results for input(s): PROBNP in the last 8760 hours.  CBG: No results for input(s): GLUCAP in the last 168 hours. Recent Results (from the past 240 hour(s))  SARS Coronavirus 2 by RT PCR (hospital order, performed in [facility] hospital lab) Nasopharyngeal Nasopharyngeal Swab     Status: None   Collection Time: 04/15/20  2:28 PM   Specimen: Nasopharyngeal Swab  Result Value Ref Range Status   SARS Coronavirus 2 NEGATIVE NEGATIVE Final    Comment: (NOTE) SARS-CoV-2 target nucleic acids are NOT DETECTED. The SARS-CoV-2 RNA is generally detectable in upper and lower  respiratory specimens during the acute phase of infection. The lowest concentration of SARS-CoV-2 viral copies this assay can detect is 250 copies / mL. A negative result does not preclude SARS-CoV-2 infection and should not be used as the sole basis for treatment or other patient management decisions.  A negative result may occur with improper specimen collection / handling, submission of specimen other than nasopharyngeal swab, presence of viral mutation(s) within the areas targeted by this assay, and inadequate number of viral copies (<250 copies / mL). A negative result must be combined with clinical observations, patient history, and epidemiological information. Fact Sheet for Patients:   BoilerBrush.com.cy Fact Sheet for Healthcare Providers: https://pope.com/ This test is not yet approved or cleared  by the [place] FDA and has been authorized for detection and/or diagnosis of SARS-CoV-2 by FDA under an Emergency Use  Authorization (EUA).  This EUA will remain in effect (meaning this test can be used) for the duration of the COVID-19 declaration under Section 564(b)(1) of the Act, 21 U.S.C. section 360bbb-3(b)(1), unless the authorization is terminated or revoked sooner. Performed at [facility], [address], [place], [place] [address]      Studies: CT Head Wo Contrast  Result Date: 04/15/2020 CLINICAL DATA:  Altered mental status. EXAM: CT HEAD WITHOUT CONTRAST TECHNIQUE: Contiguous axial images were obtained from the base of the skull through the vertex without intravenous contrast. COMPARISON:  None. FINDINGS: Brain: Mild diffuse cerebral atrophy with ex vacuo dilatation. Scattered and confluent periventricular and deep white matter hypodensities are nonspecific however commonly associated with chronic microvascular ischemic changes. No midline shift. No mass lesion. No extra-axial fluid collection. No acute infarct or intracranial hemorrhage. Motion artifact limits evaluation of the vertex. Vascular: No hyperdense vessel. Bilateral carotid siphon atherosclerotic calcifications. Skull: Negative for fracture or focal lesion. Sinuses/Orbits: Normal orbits. Layering bilateral sphenoid sinus secretions. No mastoid effusion. Other: None. IMPRESSION: No acute intracranial process. Mild cerebral atrophy and chronic microvascular ischemic changes. Layering sphenoid sinus secretions may reflect active sinus disease. Electronically Signed   By: Stana Bunting M.D.   On: 04/15/2020 13:40   DG Chest Portable 1 View  Result Date: 04/15/2020 CLINICAL DATA:  Altered mental status. EXAM: PORTABLE CHEST 1 VIEW COMPARISON:  None. FINDINGS: Telemetry wires overlie the upper and left thorax. Bibasilar streaky opacities. No pneumothorax or pleural effusion. Aortic atherosclerotic calcifications. Cardiomediastinal silhouette is within normal limits of size. Multilevel spondylosis and left glenohumeral  osteoarthrosis. IMPRESSION: Bibasilar opacities, likely atelectasis. Electronically Signed   By: Stana Bunting M.D.   On: 04/15/2020 13:35     Joycelyn Das, MD  [facility] 04/17/2020

## 2020-04-17 NOTE — Plan of Care (Signed)
  Problem: Education: Goal: Knowledge of General Education information will improve Description: Including pain rating scale, medication(s)/side effects and non-pharmacologic comfort measures Outcome: Not Progressing   Problem: Health Behavior/Discharge Planning: Goal: Ability to manage health-related needs will improve Outcome: Not Progressing   Problem: Clinical Measurements: Goal: Ability to maintain clinical measurements within normal limits will improve Outcome: Not Progressing Goal: Diagnostic test results will improve Outcome: Progressing Goal: Respiratory complications will improve Outcome: Progressing Goal: Cardiovascular complication will be avoided Outcome: Progressing   Problem: Activity: Goal: Risk for activity intolerance will decrease Outcome: Not Progressing   Problem: Nutrition: Goal: Adequate nutrition will be maintained Outcome: Not Progressing   Problem: Coping: Goal: Level of anxiety will decrease Outcome: Not Progressing   Problem: Elimination: Goal: Will not experience complications related to bowel motility Outcome: Not Progressing Goal: Will not experience complications related to urinary retention Outcome: Not Progressing   Problem: Pain Managment: Goal: General experience of comfort will improve Outcome: Progressing   Problem: Safety: Goal: Ability to remain free from injury will improve Outcome: Progressing

## 2020-04-18 ENCOUNTER — Inpatient Hospital Stay (HOSPITAL_COMMUNITY): Payer: Medicare PPO

## 2020-04-18 LAB — CBC
HCT: 45.7 % (ref 36.0–46.0)
Hemoglobin: 14.9 g/dL (ref 12.0–15.0)
MCH: 31.6 pg (ref 26.0–34.0)
MCHC: 32.6 g/dL (ref 30.0–36.0)
MCV: 96.8 fL (ref 80.0–100.0)
Platelets: 366 10*3/uL (ref 150–400)
RBC: 4.72 MIL/uL (ref 3.87–5.11)
RDW: 13.8 % (ref 11.5–15.5)
WBC: 7.3 10*3/uL (ref 4.0–10.5)
nRBC: 0 % (ref 0.0–0.2)

## 2020-04-18 LAB — BASIC METABOLIC PANEL
Anion gap: 10 (ref 5–15)
BUN: 21 mg/dL (ref 8–23)
CO2: 23 mmol/L (ref 22–32)
Calcium: 9.3 mg/dL (ref 8.9–10.3)
Chloride: 100 mmol/L (ref 98–111)
Creatinine, Ser: 1.18 mg/dL — ABNORMAL HIGH (ref 0.44–1.00)
GFR calc Af Amer: 52 mL/min — ABNORMAL LOW (ref >60)
GFR calc non Af Amer: 44 mL/min — ABNORMAL LOW (ref >60)
Glucose, Bld: 106 mg/dL — ABNORMAL HIGH (ref 70–99)
Potassium: 4.1 mmol/L (ref 3.5–5.1)
Sodium: 133 mmol/L — ABNORMAL LOW (ref 135–145)

## 2020-04-18 LAB — T3, FREE: T3, Free: 0.9 pg/mL — ABNORMAL LOW (ref 2.0–4.4)

## 2020-04-18 MED ORDER — ONDANSETRON 4 MG PO TBDP
4.0000 mg | ORAL_TABLET | Freq: Three times a day (TID) | ORAL | Status: DC | PRN
  Administered 2020-04-18 – 2020-05-09 (×5): 4 mg via ORAL
  Filled 2020-04-18 (×7): qty 1

## 2020-04-18 MED ORDER — ONDANSETRON HCL 4 MG/2ML IJ SOLN: 4.0000 mg | Freq: Four times a day (QID) | INTRAMUSCULAR | Status: DC | PRN

## 2020-04-18 NOTE — TOC Progression Note (Signed)
Transition of Care [facility]) - Progression Note    Patient Details  Name: Kristin Grimes MRN: [id number] Date of Birth: 03-04-1941  Transition of Care [facility]) CM/SW Contact  Amada Jupiter, LCSW Phone Number: 04/18/2020, 2:05 PM  Clinical Narrative:   Have begun insurance pre-authorization and SNF bed search.  Reviewed with pt's son, [name], who reports he will be out of town for the next few days and requests that his sister now be primary contact, [name] @ [phone].   Also, discussed with son that SNF plan is a short term plan and that they need to actively begin Medicaid application process for coverage of ALF when pt is ready for that level.  He understands and will begin working on this.       Expected Discharge Plan: Skilled Nursing Facility Barriers to Discharge: SNF Pending bed offer  Expected Discharge Plan and Services Expected Discharge Plan: Skilled Nursing Facility In-house Referral: Clinical Social Work Discharge Planning Services: CM Consult Post Acute Care Choice: Skilled Nursing Facility Living arrangements for the past 2 months: Single Family Home                                       Social Determinants of Health (SDOH) Interventions    Readmission Risk Interventions Readmission Risk Prevention Plan 04/18/2020  Post Dischage Appt Complete  Medication Screening Complete  Transportation Screening Complete

## 2020-04-18 NOTE — Progress Notes (Signed)
PROGRESS NOTE  Kristin Grimes ALP:[id number] DOB: September 06, 1941 DOA: 04/15/2020 PCP: Patient, No Pcp Per   LOS: 3 days   Brief narrative: As per HPI,  Kristin Grimes is a 79 y.o. female with unknown medical history other than advanced dementia, questionable esophageal surgery unspecified, who recently moved from [place] with her husband presented to hospital with worsening mental status for the last 4 weeks since the move.  Family was concerned that it was a psychotic issue and the patient has been refusing to take p.o. with prior history of esophageal surgery.  There is some question about the syncopal event over the past few weeks.  Patient was brought into the hospital because of worsening mental status and combativeness with family at home.    In the ED, patient was evaluated. Routine lab work was obtained, patient became markedly combative requiring 2 mg of IV Haldol with minimal to no improvement, subsequently patient was given 40 mg of Geodon p.o. with moderate improvement but ultimately decision was made for one-to-one sitter to be at bedside given patient's ongoing removal of close, IV was pulled, telemetry was ripped off.  Patient had routine imaging including CT head given mental status changes and chest x-ray with no overt findings.  Patient's lab work was equally as unimpressive with minimally elevated glucose but otherwise unremarkable for any acute findings.  Patient was then admitted to the hospital for further evaluation and treatment.   Assessment/Plan:  Active Problems:   Acute metabolic encephalopathy   Altered mental status   Failure to thrive in adult   Dysphagia  Advanced dementia with behavioral issues.   No clear indication for infectious process. Increasing combativeness over the last few weeks. Continue to hold all medications in case polypharmacy currently on Geodon.   Covid test was negative.  No fever but mild leukocytosis.  Chest x-ray shows bibasilar  opacities, likely atelectasis.  No acute intracranial process but cerebral atrophy. UA with ketones,no signs of infection.  CT head scan without any acute findings.    Vitamin B12 was elevated.  We will hold vitamin B-12 supplements from home. Restarted lexapro. Awaiting for SNF  History of esophageal surgery, unspecified History of esophageal surgery at [facility] .   Refusing p.o. GI was consulted.  Recommend barium swallow.  This could be progressive case of dementia.    Questionable syncopal event Possibly orthostatic given patient's poor p.o intake.  Difficult to assess due to her condition.  Mild elevated creatinine.  Will monitor.  Creatinine of 1.1 today has remained stable.  Essential hypertension.  Patient is on nifedipine, lisinopril and Coreg.  Blood pressure is stable.  Hypothyroidism.  On Synthroid at home.  Likely that she was noncompliant.  TSH of 80.  .  Free T4 is low.  On Synthroid 112 mcg at home.  Need to follow-up with TSH in 4 to 6 weeks to titrate the dose if she is compliant with Synthroid.  VTE Prophylaxis: Lovenox subcu.  Code Status: Full code  Family Communication:   None today.  Status is: Inpatient  Remains inpatient appropriate because:Unsafe d/c plan,advanced dementia with behavioral changes, awaiting skilled nursing facility placement/memory care unit.   Dispo: The patient is from: Home              Anticipated d/c is to: Skilled nursing facility/memory care unit.  Physical therapy recommend skilled nursing facility placement on discharge. TOC consulted.              Anticipated d/c date  is: 1 to 2 days.              Patient currently is medically stable for discharge  Consultants:  GI  Procedures:  None  Antibiotics:  . None  Anti-infectives (From admission, onward)   None     Subjective: Today, patient was more somnolent.  Feels irritated at times.  Not much of agitation reported.  Objective: Vitals:   04/17/20 2105 04/18/20 0501    BP: 101/69 119/76  Pulse: 61 (!) 51  Resp: 17 18  Temp: 98.1 F (36.7 C) 97.6 F (36.4 C)  SpO2: 94% 97%    Intake/Output Summary (Last 24 hours) at 04/18/2020 1106 Last data filed at 04/18/2020 0600 Gross per 24 hour  Intake 390 ml  Output 400 ml  Net -10 ml   Filed Weights   04/16/20 0116  Weight: 82.1 kg   Body mass index is 30.12 kg/m.   Physical Exam:  GENERAL: Patient is mildly somnolent and communicative, disoriented, confused has advanced dementia HENT: No scleral pallor or icterus. Pupils equally reactive to light. Oral mucosa is moist NECK: is supple, no gross swelling noted. CHEST: Clear to auscultation. No crackles or wheezes.  Diminished breath sounds bilaterally. CVS: S1 and S2 heard, no murmur. Regular rate and rhythm.  ABDOMEN: Soft, non-tender, bowel sounds are present. EXTREMITIES: No edema. CNS: Mildly somnolent, communicative confused, disoriented, tearful SKIN: warm and dry without rashes.  Data Review: I have personally reviewed the following laboratory data and studies,  CBC: Recent Labs  Lab 04/15/20 1142 04/16/20 0646 04/17/20 0401 04/18/20 0415  WBC 11.4* 11.3* 7.2 7.3  NEUTROABS 8.6*  --   --   --   HGB 13.8 15.6* 14.4 14.9  HCT 42.2 47.3* 44.1 45.7  MCV 96.1 95.4 95.9 96.8  PLT 353 395 360 366   Basic Metabolic Panel: Recent Labs  Lab 04/15/20 1142 04/16/20 0646 04/17/20 0401 04/18/20 0415  NA 138 137 137 133*  K 3.9 4.2 3.6 4.1  CL 102 102 100 100  CO2 24 23 24 23   GLUCOSE 102* 124* 130* 106*  BUN 26* 21 20 21   CREATININE 1.27* 1.16* 1.17* 1.18*  CALCIUM 9.7 9.9 9.8 9.3  MG  --   --  2.1  --    Liver Function Tests: Recent Labs  Lab 04/15/20 1142 04/16/20 0646  AST 25 32  ALT 18 22  ALKPHOS 59 66  BILITOT 1.1 1.1  PROT 7.1 7.9  ALBUMIN 3.9 4.2   Recent Labs  Lab 04/15/20 1142  LIPASE 15   No results for input(s): AMMONIA in the last 168 hours. Cardiac Enzymes: No results for input(s): CKTOTAL, CKMB,  CKMBINDEX, TROPONINI in the last 168 hours. BNP (last 3 results) No results for input(s): BNP in the last 8760 hours.  ProBNP (last 3 results) No results for input(s): PROBNP in the last 8760 hours.  CBG: No results for input(s): GLUCAP in the last 168 hours. Recent Results (from the past 240 hour(s))  SARS Coronavirus 2 by RT PCR (hospital order, performed in [facility] hospital lab) Nasopharyngeal Nasopharyngeal Swab     Status: None   Collection Time: 04/15/20  2:28 PM   Specimen: Nasopharyngeal Swab  Result Value Ref Range Status   SARS Coronavirus 2 NEGATIVE NEGATIVE Final    Comment: (NOTE) SARS-CoV-2 target nucleic acids are NOT DETECTED. The SARS-CoV-2 RNA is generally detectable in upper and lower respiratory specimens during the acute phase of infection. The lowest concentration of  SARS-CoV-2 viral copies this assay can detect is 250 copies / mL. A negative result does not preclude SARS-CoV-2 infection and should not be used as the sole basis for treatment or other patient management decisions.  A negative result may occur with improper specimen collection / handling, submission of specimen other than nasopharyngeal swab, presence of viral mutation(s) within the areas targeted by this assay, and inadequate number of viral copies (<250 copies / mL). A negative result must be combined with clinical observations, patient history, and epidemiological information. Fact Sheet for Patients:   BoilerBrush.com.cy Fact Sheet for Healthcare Providers: https://pope.com/ This test is not yet approved or cleared  by the [place] FDA and has been authorized for detection and/or diagnosis of SARS-CoV-2 by FDA under an Emergency Use Authorization (EUA).  This EUA will remain in effect (meaning this test can be used) for the duration of the COVID-19 declaration under Section 564(b)(1) of the Act, 21 U.S.C. section 360bbb-3(b)(1),  unless the authorization is terminated or revoked sooner. Performed at [facility], [address], [place], [place] [address]      Studies: No results found.   Joycelyn Das, MD  [facility] 04/18/2020

## 2020-04-18 NOTE — Progress Notes (Signed)
Physical Therapy Treatment Patient Details Name: Kristin Grimes MRN: [id number] DOB: 1941-11-03 Today's Date: 04/18/2020    History of Present Illness 79 yo female admitted with AMS, FTT. Family brought pt to ED-husband unable to care for pt. Hx of dementia, scoliosis, esophageal hernia.    PT Comments    Increased assistance required on today. Pt required Mod assist +2 for safe mobility, compared to Min assist a couple of days ago. Pt kept eyes closed for most of session. Multimodal cueing and encouragement required on today. High fall risk when mobilizing. Husband present during session on today. Continue to recommend SNF.     Follow Up Recommendations  SNF     Equipment Recommendations  None recommended by PT    Recommendations for Other Services       Precautions / Restrictions Precautions Precautions: Fall Restrictions Weight Bearing Restrictions: No    Mobility  Bed Mobility Overal bed mobility: Needs Assistance Bed Mobility: Sit to Supine       Sit to supine: Mod assist   General bed mobility comments: Assist for trunk and bil LEs onto bed. Increased time. Multimodal cues required  Transfers Overall transfer level: Needs assistance Equipment used: 2 person hand held assist Transfers: Sit to/from [employer] Sit to Stand: Mod assist;+2 physical assistance;+2 safety/equipment         General transfer comment: Assist to power up, stabilize, control descent. Pt sat before safely postioned despite cues. Multimodal cueing required. High fall risk.  Ambulation/Gait Ambulation/Gait assistance: +2 physical assistance;+2 safety/equipment;Mod assist Gait Distance (Feet): 3 Feet Assistive device: 2 person hand held assist;  Gait Pattern/deviations: Wide base of support;Decreased stride length;Decreased step length - right;Decreased step length - left     General Gait Details: High fall risk. Flexed knee and trunk posutre. Multimodal cueing  required for safety, posture, proper technique. Pt declined to ambulate further-"I don't feel good. I wanna lie down"   Stairs             Wheelchair Mobility    Modified Rankin (Stroke Patients Only)       Balance Overall balance assessment: Needs assistance         Standing balance support: Bilateral upper extremity supported Standing balance-Leahy Scale: Poor                              Cognition Arousal/Alertness: Awake/alert Behavior During Therapy: Restless Overall Cognitive Status: History of cognitive impairments - at baseline                                 General Comments: more restless and less cooperative on today      Exercises      General Comments        Pertinent Vitals/Pain Pain Assessment: Faces Faces Pain Scale: No hurt    Home Living                      Prior Function            PT Goals (current goals can now be found in the care plan section) Progress towards PT goals: Progressing toward goals    Frequency    Min 2X/week      PT Plan Frequency needs to be updated    Co-evaluation              AM-PAC  PT "6 Clicks" Mobility   Outcome Measure  Help needed turning from your back to your side while in a flat bed without using bedrails?: A Little Help needed moving from lying on your back to sitting on the side of a flat bed without using bedrails?: A Lot Help needed moving to and from a bed to a chair (including a wheelchair)?: A Lot Help needed standing up from a chair using your arms (e.g., wheelchair or bedside chair)?: A Lot Help needed to walk in hospital room?: A Lot Help needed climbing 3-5 steps with a railing? : Total 6 Click Score: 12    End of Session Equipment Utilized During Treatment: Gait belt Activity Tolerance: (limited by cognition) Patient left: in bed;with call bell/phone within reach;with bed alarm set;with family/visitor present   PT Visit Diagnosis:  Muscle weakness (generalized) (M62.81);Difficulty in walking, not elsewhere classified (R26.2);Unsteadiness on feet (R26.81)     Time: 1447-1500 PT Time Calculation (min) (ACUTE ONLY): 13 min  Charges:  $Gait Training: 8-22 mins                        Doreatha Massed, PT Acute Rehabilitation

## 2020-04-18 NOTE — Plan of Care (Signed)

## 2020-04-18 NOTE — NC FL2 (Addendum)
  [place] MEDICAID FL2 LEVEL OF CARE SCREENING TOOL     IDENTIFICATION  Patient Name: Kristin Grimes Birthdate: [birth year] Sex: female Admission Date (Current Location): 04/15/2020  [facility] and [place] Number:  Producer, television/film/video and Address:  [facility],  [address], [place] [address]      Provider Number: [id number]  Attending Physician Name and Address:  Joycelyn Das, MD  Relative Name and Phone Number:  Otho Ket [phone]    Current Level of Care: Hospital Recommended Level of Care: Skilled Nursing Facility Prior Approval Number:    Date Approved/Denied:   PASRR Number: [id number]  Discharge Plan: SNF    Current Diagnoses: Patient Active Problem List   Diagnosis Date Noted  . Altered mental status   . Failure to thrive in adult   . Dysphagia   . Acute metabolic encephalopathy 04/15/2020    Orientation RESPIRATION BLADDER Height & Weight     Self  Normal Continent Weight: 181 lb (82.1 kg) Height:  5\' 5"  (165.1 cm)  BEHAVIORAL SYMPTOMS/MOOD NEUROLOGICAL BOWEL NUTRITION STATUS      Continent    AMBULATORY STATUS COMMUNICATION OF NEEDS Skin   Limited Assist Verbally Normal                       Personal Care Assistance Level of Assistance  Bathing, Feeding Bathing Assistance: Limited assistance Feeding assistance: Limited assistance       Functional Limitations Info             SPECIAL CARE FACTORS FREQUENCY  PT (By licensed PT), OT (By licensed OT)     PT Frequency: 5x/wk OT Frequency: 5x/wk            Contractures Contractures Info: Not present    Additional Factors Info  Psychotropic, Allergies Code Status Info: Full Allergies Info: NKDA Psychotropic Info: see MAR         Current Medications (04/18/2020):  This is the current hospital active medication list Current Facility-Administered Medications  Medication Dose Route Frequency Provider Last Rate Last Admin  . carvedilol (COREG)  tablet 3.125 mg  3.125 mg Oral BID WC Pokhrel, Laxman, MD   3.125 mg at 04/18/20 1337  . enoxaparin (LOVENOX) injection 40 mg  40 mg Subcutaneous Q24H Pokhrel, Laxman, MD   40 mg at 04/18/20 1339  . escitalopram (LEXAPRO) tablet 20 mg  20 mg Oral Daily Pokhrel, Laxman, MD   20 mg at 04/18/20 1338  . levothyroxine (SYNTHROID) tablet 112 mcg  112 mcg Oral QAC breakfast Pokhrel, Laxman, MD   112 mcg at 04/18/20 0512  . lisinopril (ZESTRIL) tablet 40 mg  40 mg Oral Daily Pokhrel, Laxman, MD   40 mg at 04/18/20 1338  . NIFEdipine (PROCARDIA-XL/NIFEDICAL-XL) 24 hr tablet 30 mg  30 mg Oral Daily Pokhrel, Laxman, MD   30 mg at 04/18/20 1338  . pantoprazole (PROTONIX) EC tablet 40 mg  40 mg Oral Daily Pokhrel, Laxman, MD   40 mg at 04/18/20 1338  . ziprasidone (GEODON) capsule 40 mg  40 mg Oral BID WC 06/18/20, MD   40 mg at 04/18/20 1339     Discharge Medications: Please see discharge summary for a list of discharge medications.  Relevant Imaging Results:  Relevant Lab Results:   Additional Information SS# 06/18/20  [id number], LCSW

## 2020-04-18 NOTE — Care Management Important Message (Signed)
Important Message  Patient Details  Name: Kristin Grimes MRN: [id number] Date of Birth: 01/28/1941   Medicare Important Message Given:     Document was given to Amada Jupiter  to give to the patient.    Chari Manning 04/18/2020, 10:35 AM

## 2020-04-18 NOTE — NC FL2 (Addendum)
  [place] MEDICAID FL2 LEVEL OF CARE SCREENING TOOL     IDENTIFICATION  Patient Name: Kristin Grimes Birthdate: 1941-07-05 Sex: female Admission Date (Current Location): 04/15/2020  [facility] and [place] Number:  Producer, television/film/video and Address:  [facility],  [address] [place] [address]      Provider Number: [id number]  Attending Physician Name and Address:  Joycelyn Das, MD  Relative Name and Phone Number:  Otho Ket [phone]    Current Level of Care: Hospital Recommended Level of Care: Skilled Nursing Facility Prior Approval Number:    Date Approved/Denied:   PASRR Number: awaiting new PASRR #  Discharge Plan: SNF    Current Diagnoses: Patient Active Problem List   Diagnosis Date Noted  . Altered mental status   . Failure to thrive in adult   . Dysphagia   . Acute metabolic encephalopathy 04/15/2020    Orientation RESPIRATION BLADDER Height & Weight     Self  Normal Continent Weight: 181 lb (82.1 kg) Height:  5\' 5"  (165.1 cm)  BEHAVIORAL SYMPTOMS/MOOD NEUROLOGICAL BOWEL NUTRITION STATUS      Continent    AMBULATORY STATUS COMMUNICATION OF NEEDS Skin   Limited Assist Verbally Normal                       Personal Care Assistance Level of Assistance  Bathing, Feeding Bathing Assistance: Limited assistance Feeding assistance: Limited assistance       Functional Limitations Info             SPECIAL CARE FACTORS FREQUENCY  PT (By licensed PT), OT (By licensed OT)     PT Frequency: 5x/wk OT Frequency: 5x/wk            Contractures Contractures Info: Not present    Additional Factors Info  Psychotropic, Allergies Code Status Info: Full Allergies Info: NKDA Psychotropic Info: see MAR         Current Medications (04/18/2020):  This is the current hospital active medication list Current Facility-Administered Medications  Medication Dose Route Frequency Provider Last Rate Last Admin  . carvedilol  (COREG) tablet 3.125 mg  3.125 mg Oral BID WC Pokhrel, Laxman, MD   3.125 mg at 04/17/20 1743  . enoxaparin (LOVENOX) injection 40 mg  40 mg Subcutaneous Q24H Pokhrel, Laxman, MD   40 mg at 04/17/20 1010  . escitalopram (LEXAPRO) tablet 20 mg  20 mg Oral Daily Pokhrel, Laxman, MD   20 mg at 04/17/20 1010  . levothyroxine (SYNTHROID) tablet 112 mcg  112 mcg Oral QAC breakfast Pokhrel, Laxman, MD   112 mcg at 04/18/20 0512  . lisinopril (ZESTRIL) tablet 40 mg  40 mg Oral Daily Pokhrel, Laxman, MD   40 mg at 04/17/20 1010  . NIFEdipine (PROCARDIA-XL/NIFEDICAL-XL) 24 hr tablet 30 mg  30 mg Oral Daily Pokhrel, Laxman, MD   30 mg at 04/17/20 1744  . pantoprazole (PROTONIX) EC tablet 40 mg  40 mg Oral Daily Pokhrel, Laxman, MD   40 mg at 04/17/20 1010  . ziprasidone (GEODON) capsule 40 mg  40 mg Oral BID WC 06/17/20, MD   40 mg at 04/17/20 1742     Discharge Medications: Please see discharge summary for a list of discharge medications.  Relevant Imaging Results:  Relevant Lab Results:   Additional Information SS# 06/17/20  [id number], LCSW

## 2020-04-19 DIAGNOSIS — F03C0 Unspecified dementia, severe, without behavioral disturbance, psychotic disturbance, mood disturbance, and anxiety: Secondary | ICD-10-CM

## 2020-04-19 LAB — COMPREHENSIVE METABOLIC PANEL
ALT: 18 U/L (ref 0–44)
AST: 23 U/L (ref 15–41)
Albumin: 3.5 g/dL (ref 3.5–5.0)
Alkaline Phosphatase: 51 U/L (ref 38–126)
Anion gap: 12 (ref 5–15)
BUN: 19 mg/dL (ref 8–23)
CO2: 22 mmol/L (ref 22–32)
Calcium: 9.9 mg/dL (ref 8.9–10.3)
Chloride: 101 mmol/L (ref 98–111)
Creatinine, Ser: 1.08 mg/dL — ABNORMAL HIGH (ref 0.44–1.00)
GFR calc Af Amer: 57 mL/min — ABNORMAL LOW (ref >60)
GFR calc non Af Amer: 49 mL/min — ABNORMAL LOW (ref >60)
Glucose, Bld: 104 mg/dL — ABNORMAL HIGH (ref 70–99)
Potassium: 4.5 mmol/L (ref 3.5–5.1)
Sodium: 135 mmol/L (ref 135–145)
Total Bilirubin: 0.7 mg/dL (ref 0.3–1.2)
Total Protein: 6.4 g/dL — ABNORMAL LOW (ref 6.5–8.1)

## 2020-04-19 LAB — CBC
HCT: 47.3 % — ABNORMAL HIGH (ref 36.0–46.0)
Hemoglobin: 15.6 g/dL — ABNORMAL HIGH (ref 12.0–15.0)
MCH: 31.8 pg (ref 26.0–34.0)
MCHC: 33 g/dL (ref 30.0–36.0)
MCV: 96.3 fL (ref 80.0–100.0)
Platelets: 303 10*3/uL (ref 150–400)
RBC: 4.91 MIL/uL (ref 3.87–5.11)
RDW: 13.8 % (ref 11.5–15.5)
WBC: 8.1 10*3/uL (ref 4.0–10.5)
nRBC: 0 % (ref 0.0–0.2)

## 2020-04-19 LAB — MAGNESIUM: Magnesium: 2 mg/dL (ref 1.7–2.4)

## 2020-04-19 LAB — PHOSPHORUS: Phosphorus: 2.7 mg/dL (ref 2.5–4.6)

## 2020-04-19 MED ORDER — SODIUM CHLORIDE 0.9 % IV SOLN: INTRAVENOUS | Status: AC

## 2020-04-19 MED ORDER — ZIPRASIDONE HCL 20 MG PO CAPS
20.0000 mg | ORAL_CAPSULE | Freq: Two times a day (BID) | ORAL | Status: DC
  Administered 2020-04-20 (×2): 20 mg via ORAL
  Filled 2020-04-19 (×5): qty 1

## 2020-04-19 MED ORDER — ACETAMINOPHEN 325 MG PO TABS
650.0000 mg | ORAL_TABLET | Freq: Four times a day (QID) | ORAL | Status: DC | PRN
  Administered 2020-04-19: 650 mg via ORAL
  Filled 2020-04-19: qty 2

## 2020-04-19 NOTE — TOC Transition Note (Signed)
Transition of Care [facility]) - CM/SW Discharge Note   Patient Details  Name: Kristin Grimes MRN: [id number] Date of Birth: [birth year]  Transition of Care [facility]) CM/SW Contact:  Purl Claytor, Meriam Sprague, RN Phone Number: 04/19/2020, 2:20 PM   Clinical Narrative:    Bed offer of [facility] given to both daughter Misty Stanley via phone and husband at bedside. Medicare star ratings gone over with them as well. [facility] chosen and liaison contacted to accept bed. Pt will transport via [facility].   Final next level of care: Skilled Nursing Facility Barriers to Discharge: SNF Pending bed offer   Patient Goals and CMS Choice Patient states their goals for this hospitalization and ongoing recovery are:: SNF and then a long term care memory care facility. CMS Medicare.gov Compare Post Acute Care list provided to:: Patient Represenative (must comment)(Jason Veracruz [phone],HOO) Choice offered to / list presented to : Adult Children  Discharge Plan and Services In-house Referral: Clinical Social Work Discharge Planning Services: CM Consult Post Acute Care Choice: Skilled Nursing Facility              Readmission Risk Interventions Readmission Risk Prevention Plan 04/18/2020  Post Dischage Appt Complete  Medication Screening Complete  Transportation Screening Complete

## 2020-04-19 NOTE — TOC Transition Note (Signed)
Transition of Care [facility]) - CM/SW Discharge Note   Patient Details  Name: Kristin Grimes MRN: [id number] Date of Birth: 1941-04-29  [facility]) CM/SW Contact:  Bartholome Bill, RN Phone Number: 04/19/2020, 3:51 PM   Clinical Narrative:    [facility] rescinded their SNF bed offer. TOC will fax pt out further to hopefully get more bed offers.   Final next level of care: Skilled Nursing Facility Barriers to Discharge: SNF Pending bed offer   Patient Goals and CMS Choice Patient states their goals for this hospitalization and ongoing recovery are:: SNF and then a long term care memory care facility. CMS Medicare.gov Compare Post Acute Care list provided to:: Patient Represenative (must comment)(Jason Roussin [phone],IZT) Choice offered to / list presented to : Adult Children  Discharge Placement                       Discharge Plan and Services In-house Referral: Clinical Social Work Discharge Planning Services: CM Consult Post Acute Care Choice: Skilled Nursing Facility                               Social Determinants of Health (SDOH) Interventions     Readmission Risk Interventions Readmission Risk Prevention Plan 04/18/2020  Post Dischage Appt Complete  Medication Screening Complete  Transportation Screening Complete

## 2020-04-19 NOTE — Care Management Important Message (Signed)
Important Message  Patient Details IM Letter given to Sandford Craze RN Case Manager to present to the Patient Name: Kristin Grimes MRN: [id number] Date of Birth: 12/10/1940   Medicare Important Message Given:  Yes     Caren Macadam 04/19/2020, 10:28 AM

## 2020-04-19 NOTE — Progress Notes (Addendum)
PROGRESS NOTE  Carmin Alvidrez VXB:[id number] DOB: 29-Dec-1940 DOA: 04/15/2020 PCP: Patient, No Pcp Per   LOS: 4 days   Brief narrative: As per HPI,  Kristin Grimes is a 79 y.o. female with unknown medical history other than advanced dementia, questionable esophageal surgery unspecified, who recently moved from [place] with her husband presented to hospital with worsening mental status for the last 4 weeks since the move.  Family was concerned that it was a psychotic issue and the patient has been refusing to take p.o. with prior history of esophageal surgery.  There is some question about the syncopal event over the past few weeks.  Patient was brought into the hospital because of worsening mental status and combativeness with family at home.    In the ED, patient was evaluated. Routine lab work was obtained, patient became markedly combative requiring 2 mg of IV Haldol with minimal to no improvement, subsequently patient was given 40 mg of Geodon p.o. with moderate improvement but ultimately decision was made for one-to-one sitter to be at bedside given patient's ongoing removal of close, IV was pulled, telemetry was ripped off.  Patient had routine imaging including CT head given mental status changes and chest x-ray with no overt findings.  Patient's lab work was equally as unimpressive with minimally elevated glucose but otherwise unremarkable for any acute findings.  Patient was then admitted to the hospital for further evaluation and treatment.    Assessment/Plan:  Principal Problem:   Advanced dementia (HCC) Active Problems:   Altered mental status   Failure to thrive in adult   Dysphagia  Advanced dementia with behavioral issues.  On presentation.  This has significantly improved on the current medication regimen.  Has not required further sedating medication or sitter today.  Patient is calm and composed at this time.   Will decrease the dose of Geodon 20 twice daily at  this time.  Metabolic encephalopathy ruled out at this time.  No clear indication for infectious process. History of Increasing combativeness over the last few weeks. Continue Geodon.   Covid test was negative.  No fever but mild leukocytosis.  Chest x-ray shows bibasilar opacities, likely atelectasis.  No acute intracranial process but cerebral atrophy. UA with ketones,no signs of infection.  CT head scan without any acute findings.    Vitamin B12 was elevated.  We will hold vitamin B-12 supplements from home. Restarted lexapro. Awaiting for SNF.  Leukocytosis on presentation has trended down.  Likely reactive.   History of esophageal surgery, unspecified History of esophageal surgery at [facility] .   Refusing p.o as per family due to gas and abdominal issues- spoke with husband. GI was consulted.  Recommend barium swallow.  Barium swallow showed zenker's diverticulum and no dysphagia issues as per husband and the husband did not wish to pursue on further invasive work-up or consultation.  On dysphagia 1 diet at this time.  Will try to avoid oversedation.   Questionable syncopal event Difficult history.  Possibly orthostatic given patient's poor p.o intake.   Mild elevated creatinine.  Will monitor.  Creatinine of 1.0 today has remained stable.  Essential hypertension.  Patient is on nifedipine, lisinopril and Coreg.  Blood pressure is borderline low today.  Will hold off with nifedipine.  Put holding parameters on lisinopril and Coreg.  Hypothyroidism.  On Synthroid at home.  Likely that she was noncompliant.  TSH of 80.  Free T4 is low.  On Synthroid 112 mcg at home.  Need to follow-up  with TSH in 4 to 6 weeks to titrate the dose if she is compliant with Synthroid.  VTE Prophylaxis: Lovenox subcu.  Code Status: Full code  Family Communication: I spoke with the patient's husband on the phone and updated him about the clinical condition of the patient.  Status is: Inpatient  Remains inpatient  appropriate because:Unsafe d/c plan,advanced dementia, awaiting skilled nursing facility placement   Dispo: The patient is from: Home              Anticipated d/c is to: Skilled nursing facility/memory care unit.  Physical therapy recommend skilled nursing facility placement on discharge. TOC consulted.              Anticipated d/c date is: 1 to 2 days.              Patient currently is medically stable for discharge  Consultants: GI  Procedures: Barium swallow exam Antibiotics:  None  Anti-infectives (From admission, onward)    None      Subjective: Today, patient more calm and composed.  Mildly somnolent.  No agitation or sitter at bedside.  Objective: Vitals:   04/19/20 1534 04/19/20 1544  BP: (!) 82/51 (!) 98/53  Pulse: 60 61  Resp: 18   Temp: 97.8 F (36.6 C)   SpO2: (!) 87% 90%    Intake/Output Summary (Last 24 hours) at 04/19/2020 1556 Last data filed at 04/19/2020 0626 Gross per 24 hour  Intake 360 ml  Output 500 ml  Net -140 ml   Filed Weights   04/16/20 0116 04/19/20 1255  Weight: 82.1 kg 83.3 kg   Body mass index is 30.55 kg/m.   Physical Exam:  GENERAL: Patient is mildly somnolent. disoriented, confused has advanced dementia HENT: No scleral pallor or icterus. Pupils equally reactive to light. Oral mucosa is moist NECK: is supple, no gross swelling noted. CHEST: Clear to auscultation. No crackles or wheezes.  Diminished breath sounds bilaterally. CVS: S1 and S2 heard, no murmur. Regular rate and rhythm.  ABDOMEN: Soft, non-tender, bowel sounds are present. EXTREMITIES: No edema. CNS: Mildly somnolent, communicative confused SKIN: warm and dry without rashes.  Data Review: I have personally reviewed the following laboratory data and studies,  CBC: Recent Labs  Lab 04/15/20 1142 04/16/20 0646 04/17/20 0401 04/18/20 0415 04/19/20 0358  WBC 11.4* 11.3* 7.2 7.3 8.1  NEUTROABS 8.6*  --   --   --   --   HGB 13.8 15.6* 14.4 14.9 15.6*  HCT  42.2 47.3* 44.1 45.7 47.3*  MCV 96.1 95.4 95.9 96.8 96.3  PLT 353 395 360 366 303   Basic Metabolic Panel: Recent Labs  Lab 04/15/20 1142 04/16/20 0646 04/17/20 0401 04/18/20 0415 04/19/20 0358  NA 138 137 137 133* 135  K 3.9 4.2 3.6 4.1 4.5  CL 102 102 100 100 101  CO2 24 23 24 23 22   GLUCOSE 102* 124* 130* 106* 104*  BUN 26* 21 20 21 19   CREATININE 1.27* 1.16* 1.17* 1.18* 1.08*  CALCIUM 9.7 9.9 9.8 9.3 9.9  MG  --   --  2.1  --  2.0  PHOS  --   --   --   --  2.7   Liver Function Tests: Recent Labs  Lab 04/15/20 1142 04/16/20 0646 04/19/20 0358  AST 25 32 23  ALT 18 22 18   ALKPHOS 59 66 51  BILITOT 1.1 1.1 0.7  PROT 7.1 7.9 6.4*  ALBUMIN 3.9 4.2 3.5   Recent Labs  Lab 04/15/20 1142  LIPASE 15   No results for input(s): AMMONIA in the last 168 hours. Cardiac Enzymes: No results for input(s): CKTOTAL, CKMB, CKMBINDEX, TROPONINI in the last 168 hours. BNP (last 3 results) No results for input(s): BNP in the last 8760 hours.  ProBNP (last 3 results) No results for input(s): PROBNP in the last 8760 hours.  CBG: No results for input(s): GLUCAP in the last 168 hours. Recent Results (from the past 240 hour(s))  SARS Coronavirus 2 by RT PCR (hospital order, performed in [facility] hospital lab) Nasopharyngeal Nasopharyngeal Swab     Status: None   Collection Time: 04/15/20  2:28 PM   Specimen: Nasopharyngeal Swab  Result Value Ref Range Status   SARS Coronavirus 2 NEGATIVE NEGATIVE Final    Comment: (NOTE) SARS-CoV-2 target nucleic acids are NOT DETECTED. The SARS-CoV-2 RNA is generally detectable in upper and lower respiratory specimens during the acute phase of infection. The lowest concentration of SARS-CoV-2 viral copies this assay can detect is 250 copies / mL. A negative result does not preclude SARS-CoV-2 infection and should not be used as the sole basis for treatment or other patient management decisions.  A negative result may occur with improper  specimen collection / handling, submission of specimen other than nasopharyngeal swab, presence of viral mutation(s) within the areas targeted by this assay, and inadequate number of viral copies (<250 copies / mL). A negative result must be combined with clinical observations, patient history, and epidemiological information. Fact Sheet for Patients:   StrictlyIdeas.no Fact Sheet for Healthcare Providers: BankingDealers.co.za This test is not yet approved or cleared  by the [place] FDA and has been authorized for detection and/or diagnosis of SARS-CoV-2 by FDA under an Emergency Use Authorization (EUA).  This EUA will remain in effect (meaning this test can be used) for the duration of the COVID-19 declaration under Section 564(b)(1) of the Act, 21 U.S.C. section 360bbb-3(b)(1), unless the authorization is terminated or revoked sooner. Performed at [facility], [place] [address], [place], [place] [address]      Studies: DG ESOPHAGUS W SINGLE CM (SOL OR THIN BA)  Result Date: 04/18/2020 CLINICAL DATA:  79 year old female with history of prior esophageal hernia repair. EXAM: ESOPHOGRAM/BARIUM SWALLOW TECHNIQUE: Single contrast examination was performed using thin barium or water soluble. FLUOROSCOPY TIME:  Fluoroscopy Time:  54 seconds Radiation Exposure Index (if provided by the fluoroscopic device): 5.1 mGy Number of Acquired Spot Images: 0 COMPARISON:  None. FINDINGS: Comment: Study was limited by lack of patient mobility and dementia. Limited single contrast imaging was performed. 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 Esophageal motility was diffusely abnormal with failure to propagate normal primary peristaltic waves. No tertiary contractions were identified. No definite esophageal mass or stricture identified.  Postoperative changes at the gastroesophageal junction related to prior fundoplication procedure noted. Contrast readily traversed into the stomach. No extravasation of contrast material. IMPRESSION: 1. Postoperative changes of prior fundoplication. 2. Nonspecific esophageal motility disorder. 3. 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 Electronically Signed   By: Vinnie Langton M.D.   On: 04/18/2020 11:35     Flora Lipps, MD  [facility] 04/19/2020

## 2020-04-20 DIAGNOSIS — N179 Acute kidney failure, unspecified: Secondary | ICD-10-CM

## 2020-04-20 LAB — BASIC METABOLIC PANEL
Anion gap: 10 (ref 5–15)
BUN: 21 mg/dL (ref 8–23)
CO2: 23 mmol/L (ref 22–32)
Calcium: 10.1 mg/dL (ref 8.9–10.3)
Chloride: 100 mmol/L (ref 98–111)
Creatinine, Ser: 1.77 mg/dL — ABNORMAL HIGH (ref 0.44–1.00)
GFR calc Af Amer: 32 mL/min — ABNORMAL LOW (ref >60)
GFR calc non Af Amer: 27 mL/min — ABNORMAL LOW (ref >60)
Glucose, Bld: 113 mg/dL — ABNORMAL HIGH (ref 70–99)
Potassium: 4.2 mmol/L (ref 3.5–5.1)
Sodium: 133 mmol/L — ABNORMAL LOW (ref 135–145)

## 2020-04-20 LAB — MAGNESIUM: Magnesium: 1.9 mg/dL (ref 1.7–2.4)

## 2020-04-20 LAB — CBC
HCT: 45 % (ref 36.0–46.0)
Hemoglobin: 14.8 g/dL (ref 12.0–15.0)
MCH: 31.7 pg (ref 26.0–34.0)
MCHC: 32.9 g/dL (ref 30.0–36.0)
MCV: 96.4 fL (ref 80.0–100.0)
Platelets: 311 10*3/uL (ref 150–400)
RBC: 4.67 MIL/uL (ref 3.87–5.11)
RDW: 13.9 % (ref 11.5–15.5)
WBC: 10.3 10*3/uL (ref 4.0–10.5)
nRBC: 0 % (ref 0.0–0.2)

## 2020-04-20 MED ORDER — ENOXAPARIN SODIUM 30 MG/0.3ML ~~LOC~~ SOLN
30.0000 mg | SUBCUTANEOUS | Status: DC
  Administered 2020-04-20: 30 mg via SUBCUTANEOUS
  Filled 2020-04-20 (×2): qty 0.3

## 2020-04-20 MED ORDER — POLYETHYLENE GLYCOL 3350 17 G PO PACK
17.0000 g | PACK | Freq: Every day | ORAL | Status: DC
  Administered 2020-04-22 – 2020-05-08 (×14): 17 g via ORAL
  Filled 2020-04-20 (×14): qty 1

## 2020-04-20 MED ORDER — HALOPERIDOL LACTATE 5 MG/ML IJ SOLN
INTRAMUSCULAR | Status: AC
  Administered 2020-04-20: 2 mg via INTRAVENOUS
  Filled 2020-04-20: qty 1

## 2020-04-20 MED ORDER — SIMETHICONE 80 MG PO CHEW
80.0000 mg | CHEWABLE_TABLET | Freq: Four times a day (QID) | ORAL | Status: DC
  Administered 2020-04-20 – 2020-05-08 (×67): 80 mg via ORAL
  Filled 2020-04-20 (×69): qty 1

## 2020-04-20 MED ORDER — HALOPERIDOL LACTATE 5 MG/ML IJ SOLN
2.0000 mg | Freq: Four times a day (QID) | INTRAMUSCULAR | Status: DC | PRN
  Administered 2020-04-21 (×2): 2 mg via INTRAVENOUS
  Filled 2020-04-20 (×2): qty 1

## 2020-04-20 NOTE — Progress Notes (Signed)
Pt confused, irritable and combative. Attempting to get out of bed and noncompliant with all safety measures in place. Refusing to get back in bed and verbally aggressive. MD paged. Prn medication ordered and administered. Pt safely back in bed. No distress noted. Will continue to monitor.

## 2020-04-20 NOTE — Progress Notes (Signed)
Pt resting comfortably in bed. No signs of discomfort or  distress noted.

## 2020-04-20 NOTE — Progress Notes (Addendum)
PROGRESS NOTE  Kristin Grimes UYQ:[id number] DOB: [birth year] DOA: 04/15/2020 PCP: Patient, No Pcp Per   LOS: 5 days   Brief narrative: As per HPI,  Kristin Grimes is a 79 y.o. female with unknown medical history other than advanced dementia, questionable esophageal surgery unspecified, who recently moved from [place] with her husband presented to hospital with worsening mental status for the last 4 weeks since the move.  Family was concerned that it was a psychotic issue and the patient has been refusing to take p.o. with prior history of esophageal surgery.  There is some question about the syncopal event over the past few weeks.  Patient was brought into the hospital because of worsening mental status and combativeness with family at home.   In the ED, patient was evaluated. Routine lab work was obtained, patient became markedly combative requiring 2 mg of IV Haldol with minimal to no improvement, subsequently patient was given 40 mg of Geodon p.o. with moderate improvement but ultimately decision was made for one-to-one sitter to be at bedside given patient's ongoing removal of close, IV was pulled, telemetry was ripped off.  Patient had routine imaging including CT head given mental status changes and chest x-ray with no overt findings.  Patient's lab work was equally as unimpressive with minimally elevated glucose but otherwise unremarkable for any acute findings.  Patient was then admitted to the hospital for further evaluation and treatment.   Assessment/Plan:  Principal Problem:   Advanced dementia (HCC) Active Problems:   Altered mental status   Failure to thrive in adult   Dysphagia  Advanced dementia with behavioral issues.  On presentation.  Improved, on  lower dose of Geodon 20mg  twice daily at this time. No clear indication for infectious process. History of Increasing combativeness over the last few weeks. Continue Geodon.   Covid test was negative.  No fever but  mild leukocytosis.  Chest x-ray shows bibasilar opacities, likely atelectasis.  No acute intracranial process but cerebral atrophy. UA with ketones,no signs of infection.  CT head scan without any acute findings.    Vitamin B12 was elevated.  We will hold vitamin B-12 supplements from home. Continue  lexapro. Awaiting for SNF.  Leukocytosis on presentation has trended down.  Likely reactive.  Spoke with psychiatry for consultation due to family request would appreciate recommendations on any medications on discharge.  Family also discussing the need for possible [facility] psych placement.   History of esophageal surgery, unspecified History of esophageal surgery at [facility] .   Refusing p.o as per family due to gas and abdominal issues- spoke with husband. GI was consulted.  Recommend barium swallow.  Barium swallow showed zenker's diverticulum and no dysphagia issues as per husband and the husband did not wish to pursue on further invasive work-up or consultation.  On dysphagia 1 diet at this time.  Will try to avoid oversedation. Advance diet as tolerated. Continue PPI.  History of gaseous abdomen dyspepsia.  Continue Protonix.  Add simethicone.  Add MiraLAX for constipation.   Questionable syncopal event Difficult history.  Possibly orthostatic given patient's poor p.o intake.   Mild elevated creatinine likely mild acute kidney injury.  Will monitor.  Creatinine of 1.7 today from 1.0. will discontinue lisinopril. Continue normal saline.  Encourage oral fluids.  Check BMP in a.m.  Essential hypertension.  Patient is on nifedipine, lisinopril and Coreg.  Blood pressure was low yesterday and was put on IV fluids. Off nifedipine.  DC lisinopril. Continue coreg.   Hypothyroidism.  On Synthroid at home.  Likely that she was noncompliant.  TSH of 80.  Free T4 is low.  On Synthroid 112 mcg at home.  Need to follow-up with TSH in 4 to 6 weeks to titrate the dose if she is compliant with Synthroid.  VTE  Prophylaxis: Lovenox subcu.  Code Status: Full code  Family Communication:  I spoke with the patient's daughter Ms Kristin Grimes @ [phone] at the request of the family.  I spoke with the patient's husband on the phone yesterday.  I will consult psychiatry today.  Status is: Inpatient  Remains inpatient appropriate because:Unsafe d/c plan,advanced dementia, awaiting skilled nursing facility placement  Dispo: The patient is from: Home              Anticipated d/c is to: Skilled nursing facility/memory care unit.  Physical therapy recommend skilled nursing facility placement on discharge. TOC consulted.               Anticipated d/c date is: 1 to 2 days.               Patient currently is not medically stable for discharge, will need repeat BMP in a.m. Continue IV fluids  Consultants:  GI  Procedures: Barium swallow exam  Antibiotics:  . None  Anti-infectives (From admission, onward)   None     Subjective: Today, no interval complaints reported.  Patient closes her eyes mostly, answering few questions not agitated.  Has not had a bowel movement  Objective: Vitals:   04/19/20 2142 04/20/20 0506  BP: 117/65 (!) 145/76  Pulse: (!) 58 61  Resp:  17  Temp:  (!) 97.5 F (36.4 C)  SpO2:  95%    Intake/Output Summary (Last 24 hours) at 04/20/2020 0742 Last data filed at 04/20/2020 0610 Gross per 24 hour  Intake 597.32 ml  Output 400 ml  Net 197.32 ml   Filed Weights   04/16/20 0116 04/19/20 1255  Weight: 82.1 kg 83.3 kg   Body mass index is 30.55 kg/m.   Physical Exam: GENERAL: Patient is awake on verbal command, closes her eyes, answering few questions, confused with advanced dementia. HENT: No scleral pallor or icterus. Pupils equally reactive to light. Oral mucosa is mildly dry NECK: is supple, no gross swelling noted. CHEST: Clear to auscultation. No crackles or wheezes.  Diminished breath sounds bilaterally. CVS: S1 and S2 heard, no murmur. Regular rate and  rhythm.  ABDOMEN: Soft, non-tender, bowel sounds are present. EXTREMITIES: No edema. CNS: Confused, moving extremities. SKIN: warm and dry without rashes.  Data Review: I have personally reviewed the following laboratory data and studies,  CBC: Recent Labs  Lab 04/15/20 1142 04/15/20 1142 04/16/20 0646 04/17/20 0401 04/18/20 0415 04/19/20 0358 04/20/20 0559  WBC 11.4*   < > 11.3* 7.2 7.3 8.1 10.3  NEUTROABS 8.6*  --   --   --   --   --   --   HGB 13.8   < > 15.6* 14.4 14.9 15.6* 14.8  HCT 42.2   < > 47.3* 44.1 45.7 47.3* 45.0  MCV 96.1   < > 95.4 95.9 96.8 96.3 96.4  PLT 353   < > 395 360 366 303 311   < > = values in this interval not displayed.   Basic Metabolic Panel: Recent Labs  Lab 04/16/20 0646 04/17/20 0401 04/18/20 0415 04/19/20 0358 04/20/20 0559  NA 137 137 133* 135 133*  K 4.2 3.6 4.1 4.5 4.2  CL 102  100 100 101 100  CO2 23 24 23 22 23   GLUCOSE 124* 130* 106* 104* 113*  BUN 21 20 21 19 21   CREATININE 1.16* 1.17* 1.18* 1.08* 1.77*  CALCIUM 9.9 9.8 9.3 9.9 10.1  MG  --  2.1  --  2.0 1.9  PHOS  --   --   --  2.7  --    Liver Function Tests: Recent Labs  Lab 04/15/20 1142 04/16/20 0646 04/19/20 0358  AST 25 32 23  ALT 18 22 18   ALKPHOS 59 66 51  BILITOT 1.1 1.1 0.7  PROT 7.1 7.9 6.4*  ALBUMIN 3.9 4.2 3.5   Recent Labs  Lab 04/15/20 1142  LIPASE 15   No results for input(s): AMMONIA in the last 168 hours. Cardiac Enzymes: No results for input(s): CKTOTAL, CKMB, CKMBINDEX, TROPONINI in the last 168 hours. BNP (last 3 results) No results for input(s): BNP in the last 8760 hours.  ProBNP (last 3 results) No results for input(s): PROBNP in the last 8760 hours.  CBG: No results for input(s): GLUCAP in the last 168 hours. Recent Results (from the past 240 hour(s))  SARS Coronavirus 2 by RT PCR (hospital order, performed in [facility] hospital lab) Nasopharyngeal Nasopharyngeal Swab     Status: None   Collection Time: 04/15/20  2:28 PM    Specimen: Nasopharyngeal Swab  Result Value Ref Range Status   SARS Coronavirus 2 NEGATIVE NEGATIVE Final    Comment: (NOTE) SARS-CoV-2 target nucleic acids are NOT DETECTED. The SARS-CoV-2 RNA is generally detectable in upper and lower respiratory specimens during the acute phase of infection. The lowest concentration of SARS-CoV-2 viral copies this assay can detect is 250 copies / mL. A negative result does not preclude SARS-CoV-2 infection and should not be used as the sole basis for treatment or other patient management decisions.  A negative result may occur with improper specimen collection / handling, submission of specimen other than nasopharyngeal swab, presence of viral mutation(s) within the areas targeted by this assay, and inadequate number of viral copies (<250 copies / mL). A negative result must be combined with clinical observations, patient history, and epidemiological information. Fact Sheet for Patients:   06/15/20 Fact Sheet for Healthcare Providers: [facility] This test is not yet approved or cleared  by the 06/15/20 FDA and has been authorized for detection and/or diagnosis of SARS-CoV-2 by FDA under an Emergency Use Authorization (EUA).  This EUA will remain in effect (meaning this test can be used) for the duration of the COVID-19 declaration under Section 564(b)(1) of the Act, 21 U.S.C. section 360bbb-3(b)(1), unless the authorization is terminated or revoked sooner. Performed at [facility], [address] [place], [place]      Studies: DG ESOPHAGUS W SINGLE CM (SOL OR THIN BA)  Result Date: 04/18/2020 CLINICAL DATA:  79 year old female with history of prior esophageal hernia repair. EXAM: ESOPHOGRAM/BARIUM SWALLOW TECHNIQUE: Single contrast examination was performed using thin barium or water soluble. FLUOROSCOPY TIME:  Fluoroscopy Time:  54 seconds  Radiation Exposure Index (if provided by the fluoroscopic device): 5.1 mGy Number of Acquired Spot Images: 0 COMPARISON:  None. FINDINGS: Comment: Study was limited by lack of patient mobility and dementia. Limited single contrast imaging was performed. 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 Esophageal motility was diffusely abnormal with failure to propagate normal primary peristaltic waves. No tertiary contractions were identified. No definite  esophageal mass or stricture identified. Postoperative changes at the gastroesophageal junction related to prior fundoplication procedure noted. Contrast readily traversed into the stomach. No extravasation of contrast material. IMPRESSION: 1. Postoperative changes of prior fundoplication. 2. Nonspecific esophageal motility disorder. 3. 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 Electronically Signed   By: Trudie Reed M.D.   On: 04/18/2020 11:35     Joycelyn Das, MD  [facility] 04/20/2020

## 2020-04-20 NOTE — Progress Notes (Signed)
Occupational Therapy Treatment Patient Details Name: Kristin Grimes MRN: [id number] DOB: 01-Feb-1941 Today's Date: 04/20/2020    History of present illness 79 yo female admitted with AMS, FTT. Family brought pt to ED-husband unable to care for pt. Hx of dementia, scoliosis, esophageal hernia.   OT comments  Patient more agreeable and participatory during OT session today. Patient is distractible/tangential requiring multimodal cues to redirect to tasks. Min/mod A to sit up EOB and perform grooming/hygiene with cues for sequencing. Patient mod A x1 STS from EOB with HHA, unable to come to full standing position OT assist with positioning hips towards head of bed and with eccentric control. Set up with lunch at end of session in bed. Will continue to follow.   Follow Up Recommendations  SNF    Equipment Recommendations  None recommended by OT       Precautions / Restrictions Precautions Precautions: Fall Precaution Comments: hx of bilateral knee pain - needs replacements per spouse       Mobility Bed Mobility Overal bed mobility: Needs Assistance Bed Mobility: Supine to Sit;Sit to Supine     Supine to sit: Min assist;Mod assist Sit to supine: Mod assist   General bed mobility comments: increased time due to patient become distracted during task, max multimodal cues. mod A to lift LEs onto bed  Transfers Overall transfer level: Needs assistance Equipment used: 1 person hand held assist Transfers: Sit to/from Stand Sit to Stand: Mod assist         General transfer comment: assist to power up to standing with HHA x1, cue patient to hold onto bed rail with R hand but patient unable to come up to full standing/extend knees. OT direct patient hips towards head of bed and assist with eccentric control    Balance Overall balance assessment: Needs assistance Sitting-balance support: No upper extremity supported;Feet supported Sitting balance-Leahy Scale: Good     Standing  balance support: Single extremity supported Standing balance-Leahy Scale: Poor Standing balance comment: HHA x1, decreased stability. cue patient to place R hand on bed rail however patient sits quickly                           ADL either performed or assessed with clinical judgement   ADL Overall ADL's : Needs assistance/impaired Eating/Feeding: Set up;Bed level   Grooming: Oral care;Wash/dry face;Set up;Sitting;Cueing for sequencing                                                 Cognition Arousal/Alertness: Awake/alert Behavior During Therapy: (distractible) Overall Cognitive Status: History of cognitive impairments - at baseline                                 General Comments: cooperative with therapy with mod to max cues to redirect to task as patient is distractible/tangential                   Pertinent Vitals/ Pain       Pain Assessment: Faces Faces Pain Scale: No hurt         Frequency  Min 2X/week        Progress Toward Goals  OT Goals(current goals can now be found in the care plan section)  Progress  towards OT goals: Progressing toward goals  Acute Rehab OT Goals Patient Stated Goal: improve ability to perform daily tasks OT Goal Formulation: With family Time For Goal Achievement: 04/30/20 Potential to Achieve Goals: Fair ADL Goals Pt Will Perform Grooming: with supervision;standing Pt Will Perform Lower Body Dressing: with supervision;sit to/from stand Pt Will Transfer to Toilet: with supervision;regular height toilet;ambulating Pt Will Perform Toileting - Clothing Manipulation and hygiene: with supervision;sit to/from stand  Plan Discharge plan remains appropriate       AM-PAC OT "6 Clicks" Daily Activity     Outcome Measure   Help from another person eating meals?: A Little Help from another person taking care of personal grooming?: A Little Help from another person toileting, which includes  using toliet, bedpan, or urinal?: A Lot Help from another person bathing (including washing, rinsing, drying)?: A Lot Help from another person to put on and taking off regular upper body clothing?: A Little Help from another person to put on and taking off regular lower body clothing?: A Lot 6 Click Score: 15    End of Session  OT Visit Diagnosis: Unsteadiness on feet (R26.81)   Activity Tolerance Patient tolerated treatment well   Patient Left in bed;with call bell/phone within reach;with bed alarm set   Nurse Communication Mobility status        Time: 2395-3202 OT Time Calculation (min): 23 min  Charges: OT General Charges $OT Visit: 1 Visit OT Treatments $Self Care/Home Management : 23-37 mins  Marlyce Huge OT Pager: [phone]   Carmelia Roller 04/20/2020, 3:11 PM

## 2020-04-21 DIAGNOSIS — F03918 Unspecified dementia, unspecified severity, with other behavioral disturbance: Secondary | ICD-10-CM | POA: Diagnosis present

## 2020-04-21 LAB — CBC
HCT: 39.3 % (ref 36.0–46.0)
Hemoglobin: 12.9 g/dL (ref 12.0–15.0)
MCH: 31.8 pg (ref 26.0–34.0)
MCHC: 32.8 g/dL (ref 30.0–36.0)
MCV: 96.8 fL (ref 80.0–100.0)
Platelets: 241 10*3/uL (ref 150–400)
RBC: 4.06 MIL/uL (ref 3.87–5.11)
RDW: 13.9 % (ref 11.5–15.5)
WBC: 7.1 10*3/uL (ref 4.0–10.5)
nRBC: 0 % (ref 0.0–0.2)

## 2020-04-21 LAB — BASIC METABOLIC PANEL
Anion gap: 9 (ref 5–15)
BUN: 21 mg/dL (ref 8–23)
CO2: 23 mmol/L (ref 22–32)
Calcium: 10 mg/dL (ref 8.9–10.3)
Chloride: 105 mmol/L (ref 98–111)
Creatinine, Ser: 1.58 mg/dL — ABNORMAL HIGH (ref 0.44–1.00)
GFR calc Af Amer: 36 mL/min — ABNORMAL LOW (ref >60)
GFR calc non Af Amer: 31 mL/min — ABNORMAL LOW (ref >60)
Glucose, Bld: 103 mg/dL — ABNORMAL HIGH (ref 70–99)
Potassium: 4.3 mmol/L (ref 3.5–5.1)
Sodium: 137 mmol/L (ref 135–145)

## 2020-04-21 LAB — PHOSPHORUS: Phosphorus: 3.2 mg/dL (ref 2.5–4.6)

## 2020-04-21 LAB — MAGNESIUM: Magnesium: 2 mg/dL (ref 1.7–2.4)

## 2020-04-21 MED ORDER — OLANZAPINE 5 MG PO TABS
5.0000 mg | ORAL_TABLET | Freq: Two times a day (BID) | ORAL | Status: DC
  Administered 2020-04-21 – 2020-04-24 (×7): 5 mg via ORAL
  Filled 2020-04-21 (×8): qty 1

## 2020-04-21 MED ORDER — OLANZAPINE 10 MG IM SOLR
10.0000 mg | Freq: Three times a day (TID) | INTRAMUSCULAR | Status: DC | PRN
  Administered 2020-04-21: 10 mg via INTRAMUSCULAR
  Filled 2020-04-21 (×3): qty 10

## 2020-04-21 MED ORDER — DIVALPROEX SODIUM 250 MG PO DR TAB
250.0000 mg | DELAYED_RELEASE_TABLET | Freq: Two times a day (BID) | ORAL | Status: AC
  Administered 2020-04-21 – 2020-04-22 (×4): 250 mg via ORAL
  Filled 2020-04-21 (×5): qty 1

## 2020-04-21 MED ORDER — LORAZEPAM 2 MG/ML IJ SOLN
0.2500 mg | Freq: Once | INTRAMUSCULAR | Status: AC
  Administered 2020-04-21: 0.25 mg via INTRAVENOUS
  Filled 2020-04-21: qty 1

## 2020-04-21 MED ORDER — POLYVINYL ALCOHOL 1.4 % OP SOLN
1.0000 [drp] | OPHTHALMIC | Status: DC | PRN
  Administered 2020-05-09: 1 [drp] via OPHTHALMIC
  Filled 2020-04-21: qty 15

## 2020-04-21 MED ORDER — STERILE WATER FOR INJECTION IJ SOLN
INTRAMUSCULAR | Status: AC
  Filled 2020-04-21: qty 10

## 2020-04-21 NOTE — Progress Notes (Signed)
Pt combative, roaming around room, becoming aggressive and verbally abusive with staff.  Pt pulled out IV.  Contacted MD to get order for 2 mg IM haldol. Administered with the assistance of security, nurse tech and other 6E RN.  Patient now safely back in bed.  Order requested for soft wrist restraints until medication can become effective.

## 2020-04-21 NOTE — Progress Notes (Signed)
PROGRESS NOTE  Kristin Grimes XHB:[id number] DOB: [birth year] DOA: 04/15/2020 PCP: Patient, No Pcp Per   LOS: 6 days   Brief narrative: As per HPI,  Kristin Grimes is a 79 y.o. female with unknown medical history other than advanced dementia, questionable esophageal surgery unspecified, who recently moved from [place] with her husband presented to hospital with worsening mental status for the last 4 weeks since the move.  Family was concerned that it was a psychotic issue and the patient has been refusing to take p.o. with prior history of esophageal surgery.  There is some question about the syncopal event over the past few weeks.  Patient was brought into the hospital because of worsening mental status and combativeness with family at home.     In the ED, patient was evaluated. Routine lab work was obtained, patient became markedly combative requiring 2 mg of IV Haldol with minimal to no improvement, subsequently patient was given 40 mg of Geodon p.o. with moderate improvement but ultimately decision was made for one-to-one sitter to be at bedside. Patient had routine imaging including CT head given mental status changes and chest x-ray with no overt findings.  Patient's lab work was equally as unimpressive with minimally elevated glucose but otherwise unremarkable for any acute findings.  Patient was then admitted to the hospital for further evaluation and treatment.   Assessment/Plan:  Principal Problem:   Advanced dementia ([place]) Active Problems:   Altered mental status   Failure to thrive in adult   Dysphagia  Advanced dementia with behavioral issues.  On presentation.  She was initially on Geodon 40 twice a day.  20 mg twice a day with as needed Haldol.  Patient was calmer initially but today she is very combative and agitated and needed security at bedside.  Patient has been started on wrist restraints as well.  Care has been consulted and recommend Depakote 250 twice daily  with increased to 500 twice daily in 2 days.  Psychiatry recommends discontinuation Geodon and start Zyprexa twice a day with as needed for agitation total of 30 mg today.    Family also discussing the need for possible [facility] psych placement.  As per psychiatry patient might need inpatient psychiatric admission.    No clear indication for infectious process. History of Increasing combativeness over the last few weeks. Covid test was negative.  No fever but mild leukocytosis.  Chest x-ray shows bibasilar opacities, likely atelectasis.  No acute intracranial process but cerebral atrophy. UA with ketones,no signs of infection.  CT head scan without any acute findings.    Vitamin B12 was elevated.  We will continue to hold vitamin B-12 supplements from home. Continue  lexapro. Leukocytosis on presentation has normalized.  Likely reactive.     History of esophageal surgery,  History of esophageal surgery at [facility] . GI was consulted.  Recommend barium swallow.  Barium swallow showed zenker's diverticulum. Husband did not wish to pursue on further invasive work-up for this.  On dysphagia 1 diet at this time.  Will try to avoid oversedation. Advance diet as tolerated. Continue PPI.  History of gaseous abdomen, dyspepsia.  Continue Protonix, simethicone, MiraLAX for constipation.   Questionable syncopal event Difficult to ascertain.Marland Kitchen  Possibly orthostatic given patient's poor p.o intake.   Mild elevated creatinine likely mild acute kidney injury.  Slightly improved.  Will monitor.  Creatinine of 1.5  today from 1.7. Creatinine was 1.0 initially.  off lisinopril. on  normal saline.  Encourage oral fluids.  Check  BMP in a.m.  Essential hypertension.  Patient is on nifedipine, lisinopril and Coreg.  Blood pressure was low so was taken off nifedipine and started on normal saline.  Off lisinopril. Continue coreg for now..   Hypothyroidism.  On Synthroid at home.  Likely that she was noncompliant.  TSH of 80.  Free  T4 is low.  On Synthroid 112 mcg at home.  Need to follow-up with TSH in 4 to 6 weeks to titrate the dose if she is compliant with Synthroid.  VTE Prophylaxis: Lovenox subcu.  Code Status: Full code  Family Communication: Spoke with the patient's husband today.Marland Kitchen  Updated the psychiatric evaluation.  I spoke with the patient's daughter Ms Misty Stanley QIWLNL @ [phone] at the request of the family.   Status is: Inpatient  Remains inpatient appropriate because:Unsafe d/c plan,advanced dementia with behavioral issues difficult to control agitation, likely need inpatient psych admission.  Dispo: The patient is from: Home              Anticipated d/c is to: Inpatient psych facility/skilled nursing facility/memory care unit.  Physical therapy recommend skilled nursing facility placement on discharge. TOC consulted.               Anticipated d/c date is: 1 to 2 days.               Patient currently is not medically stable for discharge, but if her creatinine continues to improve by tomorrow she will be medically ready for psychiatric admission..  Consultants:  GI  Procedures: Barium swallow exam  Antibiotics:  . None  Anti-infectives (From admission, onward)   None     Subjective: Today, nursing staff reported that patient was combative, agitated and security was called in.  Patient required Haldol and restraints.  Objective: Vitals:   04/20/20 2115 04/21/20 0445  BP: (!) 108/52 137/90  Pulse: (!) 58 (!) 53  Resp: 18 16  Temp:  98 F (36.7 C)  SpO2: 94% 93%    Intake/Output Summary (Last 24 hours) at 04/21/2020 0754 Last data filed at 04/20/2020 2230 Gross per 24 hour  Intake 814.76 ml  Output --  Net 814.76 ml   Filed Weights   04/16/20 0116 04/19/20 1255  Weight: 82.1 kg 83.3 kg   Body mass index is 30.55 kg/m.   Physical Exam: GENERAL: Patient is awake on verbal command, closes her eyes, answering few questions, confused with advanced dementia.  HENT: No scleral  pallor or icterus. Pupils equally reactive to light. Oral mucosa is mildly dry NECK: is supple, no gross swelling noted. CHEST: Clear to auscultation. No crackles or wheezes.  Diminished breath sounds bilaterally. CVS: S1 and S2 heard, no murmur. Regular rate and rhythm.  ABDOMEN: Soft, non-tender, bowel sounds are present. EXTREMITIES: No edema. CNS: Confused, moving extremities. SKIN: warm and dry without rashes.  Data Review: I have personally reviewed the following laboratory data and studies,  CBC: Recent Labs  Lab 04/15/20 1142 04/16/20 0646 04/17/20 0401 04/18/20 0415 04/19/20 0358 04/20/20 0559 04/21/20 0559  WBC 11.4*   < > 7.2 7.3 8.1 10.3 7.1  NEUTROABS 8.6*  --   --   --   --   --   --   HGB 13.8   < > 14.4 14.9 15.6* 14.8 12.9  HCT 42.2   < > 44.1 45.7 47.3* 45.0 39.3  MCV 96.1   < > 95.9 96.8 96.3 96.4 96.8  PLT 353   < > 360 366 303  311 241   < > = values in this interval not displayed.   Basic Metabolic Panel: Recent Labs  Lab 04/17/20 0401 04/18/20 0415 04/19/20 0358 04/20/20 0559 04/21/20 0559  NA 137 133* 135 133* 137  K 3.6 4.1 4.5 4.2 4.3  CL 100 100 101 100 105  CO2 24 23 22 23 23   GLUCOSE 130* 106* 104* 113* 103*  BUN 20 21 19 21 21   CREATININE 1.17* 1.18* 1.08* 1.77* 1.58*  CALCIUM 9.8 9.3 9.9 10.1 10.0  MG 2.1  --  2.0 1.9 2.0  PHOS  --   --  2.7  --  3.2   Liver Function Tests: Recent Labs  Lab 04/15/20 1142 04/16/20 0646 04/19/20 0358  AST 25 32 23  ALT 18 22 18   ALKPHOS 59 66 51  BILITOT 1.1 1.1 0.7  PROT 7.1 7.9 6.4*  ALBUMIN 3.9 4.2 3.5   Recent Labs  Lab 04/15/20 1142  LIPASE 15   No results for input(s): AMMONIA in the last 168 hours. Cardiac Enzymes: No results for input(s): CKTOTAL, CKMB, CKMBINDEX, TROPONINI in the last 168 hours. BNP (last 3 results) No results for input(s): BNP in the last 8760 hours.  ProBNP (last 3 results) No results for input(s): PROBNP in the last 8760 hours.  CBG: No results for  input(s): GLUCAP in the last 168 hours. Recent Results (from the past 240 hour(s))  SARS Coronavirus 2 by RT PCR (hospital order, performed in [facility] hospital lab) Nasopharyngeal Nasopharyngeal Swab     Status: None   Collection Time: 04/15/20  2:28 PM   Specimen: Nasopharyngeal Swab  Result Value Ref Range Status   SARS Coronavirus 2 NEGATIVE NEGATIVE Final    Comment: (NOTE) SARS-CoV-2 target nucleic acids are NOT DETECTED. The SARS-CoV-2 RNA is generally detectable in upper and lower respiratory specimens during the acute phase of infection. The lowest concentration of SARS-CoV-2 viral copies this assay can detect is 250 copies / mL. A negative result does not preclude SARS-CoV-2 infection and should not be used as the sole basis for treatment or other patient management decisions.  A negative result may occur with improper specimen collection / handling, submission of specimen other than nasopharyngeal swab, presence of viral mutation(s) within the areas targeted by this assay, and inadequate number of viral copies (<250 copies / mL). A negative result must be combined with clinical observations, patient history, and epidemiological information. Fact Sheet for Patients:   06/15/20 Fact Sheet for Healthcare Providers: [facility] This test is not yet approved or cleared  by the 06/15/20 FDA and has been authorized for detection and/or diagnosis of SARS-CoV-2 by FDA under an Emergency Use Authorization (EUA).  This EUA will remain in effect (meaning this test can be used) for the duration of the COVID-19 declaration under Section 564(b)(1) of the Act, 21 U.S.C. section 360bbb-3(b)(1), unless the authorization is terminated or revoked sooner. Performed at [facility], [address] [place], [place]      Studies: No results found.   [place], MD  [facility] 04/21/2020

## 2020-04-21 NOTE — Plan of Care (Signed)
  Problem: Clinical Measurements: Goal: Will remain free from infection Outcome: Progressing Goal: Cardiovascular complication will be avoided Outcome: Progressing   Problem: Education: Goal: Knowledge of General Education information will improve Description: Including pain rating scale, medication(s)/side effects and non-pharmacologic comfort measures Outcome: Not Progressing   Problem: Health Behavior/Discharge Planning: Goal: Ability to manage health-related needs will improve Outcome: Not Progressing   Problem: Clinical Measurements: Goal: Ability to maintain clinical measurements within normal limits will improve Outcome: Not Progressing Goal: Diagnostic test results will improve Outcome: Not Progressing Goal: Respiratory complications will improve Outcome: Not Progressing   Problem: Activity: Goal: Risk for activity intolerance will decrease Outcome: Not Progressing   Problem: Nutrition: Goal: Adequate nutrition will be maintained Outcome: Not Progressing   Problem: Coping: Goal: Level of anxiety will decrease Outcome: Not Progressing   Problem: Elimination: Goal: Will not experience complications related to bowel motility Outcome: Not Progressing Goal: Will not experience complications related to urinary retention Outcome: Not Progressing   Problem: Pain Managment: Goal: General experience of comfort will improve Outcome: Not Progressing   Problem: Safety: Goal: Ability to remain free from injury will improve Outcome: Not Progressing   Problem: Skin Integrity: Goal: Risk for impaired skin integrity will decrease Outcome: Not Progressing

## 2020-04-21 NOTE — Progress Notes (Signed)
Pt now resting comfortably in bed.  No need for restraint order at this time.

## 2020-04-21 NOTE — Progress Notes (Signed)
PT Cancellation Note  Patient Details Name: Kristin Grimes MRN: [id number] DOB: [birth year]   Cancelled Treatment:    Reason Eval/Treat Not Completed: Patient's level of consciousness  Pt was agitated earlier and now has had haldol and unable to participate.  Will f/u as able. Anise Salvo, PT Acute Rehab Services Pager [phone] [facility] Rehab [phone]     Rayetta Humphrey 04/21/2020, 2:14 PM

## 2020-04-21 NOTE — TOC Progression Note (Signed)
Transition of Care [facility]) - Progression Note    Patient Details  Name: Kristin Grimes MRN: [id number] Date of Birth: 05/31/1941  Transition of [facility]) CM/SW Contact  Meli Faley, Meriam Sprague, RN Phone Number: 04/21/2020, 4:04 PM  Clinical Narrative:       Pt was seen by Psych today and inpatient Geri psych was recommended. Pt info faxed out to all geri psych facilities in the area. TOC will continue to follow.     Social Determinants of Health (SDOH) Interventions    Readmission Risk Interventions Readmission Risk Prevention Plan 04/18/2020  Post Dischage Appt Complete  Medication Screening Complete  Transportation Screening Complete

## 2020-04-21 NOTE — Consult Note (Signed)
[facility] Face-to-Face Psychiatry Consult   Reason for Consult:  Agitation, dementia Referring Physician:  Dr Tyson Babinski Patient Identification: Kristin Grimes MRN:  [id number] Principal Diagnosis: Advanced dementia [facility]) Diagnosis:  Principal Problem:   Advanced dementia (HCC) Active Problems:   Dementia with behavioral disturbance (HCC)   Altered mental status   Failure to thrive in adult   Dysphagia   Total Time spent with patient: 1 hour  Subjective:   [name] is a 79 y.o. female patient admitted with dysphagia.  Patient seen and evaluated in person by this provider.  She had been agitated and required as needed medications and restraints.  Upon entering her room, she yelled, "I need the police!"  When attempting to ask more questions she became more agitated and stated, "I said I needed the police!"  Client has a history of dementia and currently has behavioral issues.  These will need to be stabilized prior to placement in a facility.  Medications recommended below along with [facility] psychiatric hospitalization to continue to help stabilize her agitation for placement.  HPI per MD:   Kristin Grimes is a 79 y.o. female with unknown medical history other than advanced dementia, questionable esophageal surgery unspecified.  Patient recently moved from [place] with husband with known advanced dementia with worsening mental status over the past 4 weeks since the move.  Family is concerned that this is a psychotic issue related to her dementia worsening acutely and is now refusing to take p.o.  Patient's family is also concerned that her refusal of p.o. may be related in someway to her distant history of esophageal surgery.  There is discussion about possible syncopal event over the past few weeks but patient was brought to the ED acutely today due to worsening mental status and combative nature with family at home.   ED Course: In the ED patient was evaluated routine  lab work was obtained, patient became markedly combative requiring 2 mg of IV Haldol with minimal to no improvement, subsequently patient was given 40 mg of Geodon p.o. with moderate improvement but ultimately decision was made for one-to-one sitter to be at bedside given patient's ongoing removal of close, IV was pulled, telemetry was ripped off.  Unfortunately husband left in the middle of patient's combative state and would not return to help reorient the patient as he was "worn out."  Patient had routine imaging including CT head given mental status changes and chest x-ray with no overt findings.  Patient's lab work was equally as unimpressive with minimally elevated glucose but otherwise unremarkable for any acute findings.  Past Psychiatric History: dementia, depression  Risk to Self:   Risk to Others:   Prior Inpatient Therapy:   Prior Outpatient Therapy:    Past Medical History:  Past Medical History:  Diagnosis Date  . Hypothyroidism    History reviewed. No pertinent surgical history. Family History: History reviewed. No pertinent family history. Family Psychiatric  History: none Social History:  Social History   Substance and Sexual Activity  Alcohol Use Yes   Comment: occasional     Social History   Substance and Sexual Activity  Drug Use Never    Social History   Socioeconomic History  . Marital status: Married    Spouse name: Not on file  . Number of children: Not on file  . Years of education: Not on file  . Highest education level: Not on file  Occupational History  . Not on file  Tobacco Use  .  Smoking status: Former Smoker    Types: Cigarettes  . Smokeless tobacco: Never Used  Vaping Use  . Vaping Use: Never used  Substance and Sexual Activity  . Alcohol use: Yes    Comment: occasional  . Drug use: Never  . Sexual activity: Not on file  Other Topics Concern  . Not on file  Social History Narrative  . Not on file   Social Determinants of Health    Financial Resource Strain:   . Difficulty of Paying Living Expenses:   Food Insecurity:   . Worried About Programme researcher, broadcasting/film/video in the Last Year:   . Barista in the Last Year:   Transportation Needs:   . Freight forwarder (Medical):   Marland Kitchen Lack of Transportation (Non-Medical):   Physical Activity:   . Days of Exercise per Week:   . Minutes of Exercise per Session:   Stress:   . Feeling of Stress :   Social Connections:   . Frequency of Communication with Friends and Family:   . Frequency of Social Gatherings with Friends and Family:   . Attends Religious Services:   . Active Member of Clubs or Organizations:   . Attends Banker Meetings:   Marland Kitchen Marital Status:    Additional Social History:    Allergies:  No Known Allergies  Labs:  Results for orders placed or performed during the hospital encounter of 04/15/20 (from the past 48 hour(s))  Basic metabolic panel     Status: Abnormal   Collection Time: 04/20/20  5:59 AM  Result Value Ref Range   Sodium 133 (L) 135 - 145 mmol/L   Potassium 4.2 3.5 - 5.1 mmol/L   Chloride 100 98 - 111 mmol/L   CO2 23 22 - 32 mmol/L   Glucose, Bld 113 (H) 70 - 99 mg/dL    Comment: Glucose reference range applies only to samples taken after fasting for at least 8 hours.   BUN 21 8 - 23 mg/dL   Creatinine, Ser 3.90 (H) 0.44 - 1.00 mg/dL   Calcium 30.0 8.9 - 92.3 mg/dL   GFR calc non Af Amer 27 (L) >60 mL/min   GFR calc Af Amer 32 (L) >60 mL/min   Anion gap 10 5 - 15    Comment: Performed at [facility], [address], [place], [place] [address]  CBC     Status: None   Collection Time: 04/20/20  5:59 AM  Result Value Ref Range   WBC 10.3 4.0 - 10.5 K/uL   RBC 4.67 3.87 - 5.11 MIL/uL   Hemoglobin 14.8 12.0 - 15.0 g/dL   HCT 22.6 36 - 46 %   MCV 96.4 80.0 - 100.0 fL   MCH 31.7 26.0 - 34.0 pg   MCHC 32.9 30.0 - 36.0 g/dL   RDW 33.3 54.5 - 62.5 %   Platelets 311 150 - 400 K/uL   nRBC 0.0 0.0 - 0.2 %     Comment: Performed at [facility], [address], [place], [place] [address]  Magnesium     Status: None   Collection Time: 04/20/20  5:59 AM  Result Value Ref Range   Magnesium 1.9 1.7 - 2.4 mg/dL    Comment: Performed at [facility], [address] [place], [place] [address]  Basic metabolic panel     Status: Abnormal   Collection Time: 04/21/20  5:59 AM  Result Value Ref Range   Sodium  137 135 - 145 mmol/L   Potassium 4.3 3.5 - 5.1 mmol/L   Chloride 105 98 - 111 mmol/L   CO2 23 22 - 32 mmol/L   Glucose, Bld 103 (H) 70 - 99 mg/dL    Comment: Glucose reference range applies only to samples taken after fasting for at least 8 hours.   BUN 21 8 - 23 mg/dL   Creatinine, Ser 3.47 (H) 0.44 - 1.00 mg/dL   Calcium 42.5 8.9 - 95.6 mg/dL   GFR calc non Af Amer 31 (L) >60 mL/min   GFR calc Af Amer 36 (L) >60 mL/min   Anion gap 9 5 - 15    Comment: Performed at [facility], [address], [place], [place] [address]  CBC     Status: None   Collection Time: 04/21/20  5:59 AM  Result Value Ref Range   WBC 7.1 4.0 - 10.5 K/uL   RBC 4.06 3.87 - 5.11 MIL/uL   Hemoglobin 12.9 12.0 - 15.0 g/dL   HCT 43.3 36 - 46 %   MCV 96.8 80.0 - 100.0 fL   MCH 31.8 26.0 - 34.0 pg   MCHC 32.8 30.0 - 36.0 g/dL   RDW 29.5 18.8 - 41.6 %   Platelets 241 150 - 400 K/uL   nRBC 0.0 0.0 - 0.2 %    Comment: Performed at [facility], [address] [place], [place] [address]  Magnesium     Status: None   Collection Time: 04/21/20  5:59 AM  Result Value Ref Range   Magnesium 2.0 1.7 - 2.4 mg/dL    Comment: Performed at [facility], [address], [place], [place] [address]  Phosphorus     Status: None   Collection Time: 04/21/20  5:59 AM  Result Value Ref Range   Phosphorus 3.2 2.5 - 4.6 mg/dL    Comment: Performed at [facility], [address], [place], [place] [address]    Current  Facility-Administered Medications  Medication Dose Route Frequency Provider Last Rate Last Admin  . acetaminophen (TYLENOL) tablet 650 mg  650 mg Oral Q6H PRN Eduard Clos, MD   650 mg at 04/19/20 2305  . carvedilol (COREG) tablet 3.125 mg  3.125 mg Oral BID WC Pokhrel, Laxman, MD   3.125 mg at 04/21/20 0831  . enoxaparin (LOVENOX) injection 30 mg  30 mg Subcutaneous Q24H Earl Many M, RPH   30 mg at 04/20/20 1030  . escitalopram (LEXAPRO) tablet 20 mg  20 mg Oral Daily Pokhrel, Laxman, MD   20 mg at 04/21/20 1023  . haloperidol lactate (HALDOL) injection 2 mg  2 mg Intravenous Q6H PRN Pokhrel, Laxman, MD   2 mg at 04/21/20 1129  . levothyroxine (SYNTHROID) tablet 112 mcg  112 mcg Oral QAC breakfast Pokhrel, Laxman, MD   112 mcg at 04/21/20 0616  . ondansetron (ZOFRAN-ODT) disintegrating tablet 4 mg  4 mg Oral Q8H PRN Pokhrel, Laxman, MD   4 mg at 04/19/20 2305  . pantoprazole (PROTONIX) EC tablet 40 mg  40 mg Oral Daily Pokhrel, Laxman, MD   40 mg at 04/21/20 1023  . polyethylene glycol (MIRALAX / GLYCOLAX) packet 17 g  17 g Oral Daily Pokhrel, Laxman, MD      . simethicone (MYLICON) chewable tablet 80 mg  80 mg Oral QID Pokhrel, Laxman, MD   80 mg at 04/21/20 1023  . ziprasidone (GEODON) capsule 20 mg  20 mg Oral BID  WC Pokhrel, Laxman, MD   20 mg at 04/20/20 1746    Musculoskeletal: Strength & Muscle Tone: increased Gait & Station: did not witness Patient leans: N/A  Psychiatric Specialty Exam: Physical Exam  Nursing note and vitals reviewed. Constitutional: She appears well-developed.  HENT:  Head: Normocephalic.  Cardiovascular: Normal rate.  Respiratory: Effort normal.  Neurological: She is alert.  Psychiatric: Her speech is normal. Her affect is angry. She is agitated, aggressive and combative. Thought content is paranoid. She expresses impulsivity and inappropriate judgment. She is inattentive.    Review of Systems  Psychiatric/Behavioral: Positive for behavioral  problems and confusion. The patient is nervous/anxious.   All other systems reviewed and are negative.   Blood pressure (!) 146/81, pulse 69, temperature 98 F (36.7 C), temperature source Oral, resp. rate 16, height 5\' 5"  (1.651 m), weight 83.3 kg, SpO2 93 %.Body mass index is 30.55 kg/m.  General Appearance: Casual  Eye Contact:  Good  Speech:  Normal Rate  Volume:  Increased  Mood:  Angry, Anxious and Irritable  Affect:  Congruent  Thought Process:  Irrelevant  Orientation:  Other:  person  Thought Content:  unable to assess  Suicidal Thoughts:  No self harm behaviors  Homicidal Thoughts:  No  Memory:  unable to assess  Judgement:  Impaired  Insight:  Lacking  Psychomotor Activity:  Increased  Concentration:  Concentration: Poor and Attention Span: Poor  Recall:  unable to assess  Fund of Knowledge:  unable to assess  Language:  Good  Akathisia:  No  Handed:  Right  AIMS (if indicated):     Assets:  Leisure Time Resilience Social Support  ADL's:  Impaired  Cognition:  Impaired,  Moderate  Sleep:        Treatment Plan Summary: Dementia with behavioral disturbance: -Recommend geriatric psychiatry for stabilization of mood as no facility is going to accept her with this level of aggression -Recommend Depakote 250 mg BID with increase to 500 mg BID after 2 days -Recommend not continuing Geodon 20 mg twice daily -Recommend Zyprexa 5 mg twice daily instead to increase in behavior issues.  This can also be given IM for agitation at 10 mg as needed, limit Zyprexa to 30 mg total in a day.  If Zyprexa has been used as needed, recommend discontinuing the Haldol.  Disposition: Recommend psychiatric Inpatient admission when medically cleared.  Waylan Boga, NP 04/21/2020 12:11 PM

## 2020-04-22 LAB — CBC
HCT: 40.1 % (ref 36.0–46.0)
Hemoglobin: 13.1 g/dL (ref 12.0–15.0)
MCH: 31.6 pg (ref 26.0–34.0)
MCHC: 32.7 g/dL (ref 30.0–36.0)
MCV: 96.9 fL (ref 80.0–100.0)
Platelets: 267 10*3/uL (ref 150–400)
RBC: 4.14 MIL/uL (ref 3.87–5.11)
RDW: 14 % (ref 11.5–15.5)
WBC: 6.6 10*3/uL (ref 4.0–10.5)
nRBC: 0 % (ref 0.0–0.2)

## 2020-04-22 LAB — BASIC METABOLIC PANEL
Anion gap: 11 (ref 5–15)
BUN: 18 mg/dL (ref 8–23)
CO2: 26 mmol/L (ref 22–32)
Calcium: 10 mg/dL (ref 8.9–10.3)
Chloride: 102 mmol/L (ref 98–111)
Creatinine, Ser: 1.38 mg/dL — ABNORMAL HIGH (ref 0.44–1.00)
GFR calc Af Amer: 43 mL/min — ABNORMAL LOW (ref >60)
GFR calc non Af Amer: 37 mL/min — ABNORMAL LOW (ref >60)
Glucose, Bld: 106 mg/dL — ABNORMAL HIGH (ref 70–99)
Potassium: 4.3 mmol/L (ref 3.5–5.1)
Sodium: 139 mmol/L (ref 135–145)

## 2020-04-22 LAB — MAGNESIUM: Magnesium: 1.9 mg/dL (ref 1.7–2.4)

## 2020-04-22 MED ORDER — ENOXAPARIN SODIUM 40 MG/0.4ML ~~LOC~~ SOLN
40.0000 mg | SUBCUTANEOUS | Status: DC
  Administered 2020-04-23 – 2020-05-09 (×17): 40 mg via SUBCUTANEOUS
  Filled 2020-04-22 (×19): qty 0.4

## 2020-04-22 MED ORDER — POLYVINYL ALCOHOL 1.4 % OP SOLN
1.0000 [drp] | Freq: Two times a day (BID) | OPHTHALMIC | Status: DC
  Administered 2020-04-22 – 2020-05-08 (×31): 1 [drp] via OPHTHALMIC
  Filled 2020-04-22 (×2): qty 15

## 2020-04-22 MED ORDER — LORAZEPAM 2 MG/ML IJ SOLN
0.2500 mg | Freq: Once | INTRAMUSCULAR | Status: AC
  Administered 2020-04-22: 0.25 mg via INTRAVENOUS
  Filled 2020-04-22 (×2): qty 1

## 2020-04-22 MED ORDER — BISACODYL 10 MG RE SUPP: 10.0000 mg | Freq: Every day | RECTAL | Status: DC | PRN

## 2020-04-22 NOTE — Progress Notes (Signed)
Physical Therapy Treatment Patient Details Name: Lindora Alviar MRN: [id number] DOB: 1940-12-13 Today's Date: 04/22/2020    History of Present Illness Pt is 79 yo female admitted with AMS, FTT. Family brought pt to ED-husband unable to care for pt due to dementia with behavioral disturbances.  Pt currently being managed with medications and has had varied behaviors.Marland Kitchen Hx of dementia, scoliosis, esophageal hernia.    PT Comments    Pt confused but cooperative with encouragement and tactile assist.  She required mod A of 2 for transfers due to posterior lean and difficulty with commands.  Pt initially with strong posterior lean but able to improve with facilitation and multimodal cues.  Pt was unable/unsafe to take steps away from bed due to balance and difficulty with commands.  Cont to advance as able.     Follow Up Recommendations  SNF     Equipment Recommendations  None recommended by PT    Recommendations for Other Services       Precautions / Restrictions Precautions Precautions: Fall Precaution Comments: varied level of cooperativeness/agitation    Mobility  Bed Mobility Overal bed mobility: Needs Assistance Bed Mobility: Supine to Sit;Sit to Supine     Supine to sit: Mod assist;HOB elevated;+2 for physical assistance Sit to supine: Mod assist;HOB elevated;+2 for physical assistance   General bed mobility comments: Increased time; multimodal cues;  Distracted and required assist to stay on task  Transfers Overall transfer level: Needs assistance Equipment used: Rolling walker (2 wheeled)   Sit to Stand: Mod assist;+2 physical assistance         General transfer comment: Cues to get pt to lean forward prior to standing then mod A x 2 to power up ; assist for correct hand placement  Ambulation/Gait Ambulation/Gait assistance: Mod assist;+2 physical assistance Gait Distance (Feet): 2 Feet Assistive device: Rolling walker (2 wheeled) Gait Pattern/deviations:  Wide base of support;Decreased stride length;Leaning posteriorly Gait velocity: decreased   General Gait Details: Leaning posteriorly; only took a few steps toward [facility] with weight shifting facilitation   Stairs             Wheelchair Mobility    Modified Rankin (Stroke Patients Only)       Balance Overall balance assessment: Needs assistance Sitting-balance support: Feet supported;Bilateral upper extremity supported Sitting balance-Leahy Scale: Poor Sitting balance - Comments: Pt initially leaning posteriorly.  Provided multimodal cues as well as reaching task to encourage forward lean.  Sat EOB for 5 mins working on posture prior to standing   Standing balance support: Bilateral upper extremity supported;During functional activity Standing balance-Leahy Scale: Poor Standing balance comment: Initially with posterior lean requiring multimodal cues and facilitation for balance.  Pt stood for 2-3 mins with weight shifting, taking a couple steps in place, and side steps.                            Cognition Arousal/Alertness: Lethargic Behavior During Therapy:  (cooperative but confused) Overall Cognitive Status: History of cognitive impairments - at baseline                                 General Comments: Cooperative with therapy with mod to max cues to redirect to task as patient is distractible/tangential      Exercises      General Comments General comments (skin integrity, edema, etc.): Pt tending to keep eyes closed  and reports dizziness but unable to elaborate.  VSS.      Pertinent Vitals/Pain Pain Assessment: No/denies pain    Home Living                      Prior Function            PT Goals (current goals can now be found in the care plan section) Progress towards PT goals: Progressing toward goals    Frequency    Min 2X/week      PT Plan Current plan remains appropriate    Co-evaluation               AM-PAC PT "6 Clicks" Mobility   Outcome Measure  Help needed turning from your back to your side while in a flat bed without using bedrails?: A Little Help needed moving from lying on your back to sitting on the side of a flat bed without using bedrails?: A Lot Help needed moving to and from a bed to a chair (including a wheelchair)?: A Lot Help needed standing up from a chair using your arms (e.g., wheelchair or bedside chair)?: A Lot Help needed to walk in hospital room?: A Lot Help needed climbing 3-5 steps with a railing? : Total 6 Click Score: 12    End of Session Equipment Utilized During Treatment: Gait belt Activity Tolerance: Other (comment) (limited by cognition) Patient left: in bed;with call bell/phone within reach;with bed alarm set;with family/visitor present Nurse Communication: Mobility status PT Visit Diagnosis: Muscle weakness (generalized) (M62.81);Difficulty in walking, not elsewhere classified (R26.2);Unsteadiness on feet (R26.81)     Time: 5188-4166 PT Time Calculation (min) (ACUTE ONLY): 16 min  Charges:  $Therapeutic Activity: 8-22 mins                     Abran Richard, PT Acute Rehab Services Pager [phone] [facility] [phone]     Karlton Lemon 04/22/2020, 4:21 PM

## 2020-04-22 NOTE — Progress Notes (Signed)
Shorty after receiving bedside report , Kristin Grimes became agitated, verbal and threatening towards staff. Not following commands, unsteady with attempts to get out of bed. Also incontinent large amount urine in bed. After multiple attempts to redirect/calm pt, on call hospitalist paged and new order for 0.25mg  Ativan IV received via T.O. read back,. Good results obtained from Ativan. Pt calmer

## 2020-04-22 NOTE — TOC Progression Note (Signed)
Transition of Care [facility]) - Progression Note    Patient Details  Name: Rio Taber MRN: [id number] Date of Birth: 1941-03-23  [facility] CM/SW Contact  Annelies Coyt, Meriam Sprague, RN Phone Number: 04/22/2020, 2:51 PM  Clinical Narrative:       This CM faxed pt out to the following [facility] facilities: [facility]- No available beds [facility] [address] [facility]- No longer accepts Geri pts [place]- Declines,  MD states that pt needs to have meds stabilized in hospital and pt placed in a skilled facility.      TOC will continue to follow and assist with placement.   Social Determinants of Health (SDOH) Interventions    Readmission Risk Interventions Readmission Risk Prevention Plan 04/18/2020  Post Dischage Appt Complete  Medication Screening Complete  Transportation Screening Complete

## 2020-04-22 NOTE — Care Management Important Message (Signed)
Important Message  Patient Details IM Letter given to Sandford Craze RN Case Manager to present to the Patient Name: Kristin Grimes MRN: [id number] Date of Birth: Dec 03, 1940   Medicare Important Message Given:  Yes     Caren Macadam 04/22/2020, 11:01 AM

## 2020-04-22 NOTE — Progress Notes (Addendum)
PROGRESS NOTE    Kristin Grimes  QMG:[id number] DOB: 1941/09/03 DOA: 04/15/2020 PCP: Patient, No Pcp Per   Chef Complaints: Agitation  Brief Narrative: As Per previous MD 79 y.o.femalewithunknown medical history other than advanced dementia, questionable esophageal surgery unspecified, who recently moved from [place] with her husband presented to hospital with worsening mental status for the last 4 weeks since the move.  Family was concerned that it was a psychotic issue and the patient has been refusing to take p.o. with prior history of esophageal surgery.  There is some question about the syncopal event over the past few weeks.  Patient was brought into the hospital because of worsening mental status and combativeness with family at home.   In the ED, patient was evaluated. Routine lab work was obtained, patient became markedly combative requiring 2 mg of IV Haldol with minimal to no improvement, subsequently patient was given 40 mg of Geodon p.o. with moderate improvement but ultimately decision was made for one-to-one sitter to be at bedside.Patient had routine imaging including CT head given mental status changes and chest x-ray with no overt findings. Patient's lab work was equally as unimpressive with minimally elevated glucose but otherwise unremarkable for any acute findings.  Patient was then admitted to the hospital for further evaluation and treatment.   Patient was monitored followed up.  No obvious infection identified CMP has been fairly stable. Mild elevated creatinine peaked to 1.7 and downtrending since Vitamin B12 at 2687, TSH elevated at 82.5 with low free T4 low at 0.54 ( nl 0.61-1.12) FT3 0.9 Underwent esophagogram that showed pevious fundoplication, diverticulum  Subjective:  Reports patient did well with low-dose iv Ativan last night. Now as it is working up patient has been getting more restless/agitated and hypertensive Patient has not been eating or  drinking well. Lab with creatinine downtrending 1.3 from 1.5 cBC stable.  Afebrile. This morning CAD mumbling words able to tell me her name, not able to hold conversation moving all her extremities. Husband reports they moved from [place] 4-week ago 1 week before admission patient has been increasingly restless agitated not sleeping and has not been eating which prompted to the ED visit as husband was unable to take care of her.  In [place] patient was displaying short-term memory loss with frequent forgetting asking questions about was dressing herself able to feed herself going out for dinner and ambulating.  Assessment & Plan:  Advanced dementia with behavioral issues, agitated on presentation needing Geodon/Haldol.  Initially, but has been combative and agitated needing security at bedside, at times restrained.  CT head no acute finding LFTs stable no obvious infection, afebrile UA showed ketones 20+ WBC 0-5.  Chest x-ray with bibasilar opacities likely atelectasis.  Increasing agitation behavioral issues at home 1 week prior to admission.  Patient is followed by psychiatry to adjust medication. On Depakote every 12, Zyprexa twice daily, Celexa 20 mg daily.  Can consider Seroquel at bedtime if needed. B12 remains on hold due to level being high.  Had leukocytosis on presentation, but resolved since. RN reports good response/sleep with low-dose Ativan last night, can consider needed nightly Ativan.  Dehydration/ketosis the urine:from poor oral intake increase intake, IV fluid hydration if unable to feed. 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 Husband did not wish to pursue on further invasive work-up for this.    Continue dysphagia diet, PPI, diet as tolerated.  Feeding assistance as needed  History of gaseous distention/dyspepsia continue Protonix, simethicone and MiraLAX.  Husband reports patient not  having bowel movement for a week.  Can try Dulcolax suppository if needed  ?Syncopal event: Difficult to ascertain question orthostatics given patient's poor intake  Mild AKI with elevated creatinine creatinine downtrending encourage hydration if unable to keep up with hydration can consider IV fluid.  Patient is off lisinopril for now.  Essential hypertension: On nifedipine lisinopril and Coreg at home BP was low so taken off nifedipine, off lisinopril.  Continue Coreg.  Continue to monitor.  Hypothyroidism TSH elevated  free T4 on lower side likely from noncompliance.TSH elevated at 82.5 with low free T4 low at 0.54.  Synthroid has been resumed.  Eye irritation: add teardrop  DVT prophylaxis:lovenox Code Status: FULL Family Communication: plan of care discussed with patient's husband at bedside.  Status is: Inpatient Remains inpatient appropriate because:Unsafe d/c plan and Ongoing confusion/dementia with agitation  Dispo: Patient From: Home  Planned Disposition: To be determined   Expected discharge date: 04/23/20  Medically stable for discharge: No  Diet Order            DIET - DYS 1 Room service appropriate? Yes; Fluid consistency: Thin  Diet effective now                 Body mass index is 30.55 kg/m. Consultants:see note  Procedures:see note Microbiology:see note  Medications: Scheduled Meds: . carvedilol  3.125 mg Oral BID WC  . divalproex  250 mg Oral Q12H  . enoxaparin (LOVENOX) injection  40 mg Subcutaneous Q24H  . escitalopram  20 mg Oral Daily  . levothyroxine  112 mcg Oral QAC breakfast  . LORazepam  0.25 mg Intravenous Once  . OLANZapine  5 mg Oral BID  . pantoprazole  40 mg Oral Daily  . polyethylene glycol  17 g Oral Daily  . simethicone  80 mg Oral QID   Continuous Infusions: Antimicrobials: Anti-infectives (From admission, onward)   None    Objective: Vitals: Today's Vitals   04/21/20 1259 04/21/20 1731 04/21/20 2217 04/22/20 0833  BP:  (!) 141/79 (!) 143/80 138/75 (!) 169/148  Pulse: 67 79 72 86  Resp: 16  15   Temp: 98.1 F (36.7 C)  98 F (36.7 C)   TempSrc: Oral  Oral   SpO2: 90%  92%   Weight:      Height:      PainSc:        Intake/Output Summary (Last 24 hours) at 04/22/2020 1345 Last data filed at 04/22/2020 0045 Gross per 24 hour  Intake 220 ml  Output --  Net 220 ml   Filed Weights   04/16/20 0116 04/19/20 1255  Weight: 82.1 kg 83.3 kg   Weight change:    Intake/Output from previous day: 06/10 0701 - 06/11 0700 In: 220 [P.O.:220] Out: -  Intake/Output this shift: No intake/output data recorded.  Examination: General exam: Sleepy, intermittently wakes up opens eyes follows some commands but not interactive HEENT:Oral mucosa moist, Ear/Nose WNL grossly,dentition normal. Respiratory system: bilaterally clear, no wheezing or crackles,no use of accessory muscle, non tender. Cardiovascular system: S1 & S2 +, regular, No JVD. Gastrointestinal system: Abdomen soft, NT,ND, BS+. Nervous System:Alert, awake, moving extremities and grossly nonfocal. Extremities: No edema, distal peripheral pulses palpable.  Skin: No rashes,no icterus. MSK: Normal muscle bulk,tone, power.  Data Reviewed: I have personally reviewed following labs and imaging studies CBC: Recent Labs  Lab 04/18/20 0415 04/19/20 0358 04/20/20 0559 04/21/20 0559  04/22/20 0539  WBC 7.3 8.1 10.3 7.1 6.6  HGB 14.9 15.6* 14.8 12.9 13.1  HCT 45.7 47.3* 45.0 39.3 40.1  MCV 96.8 96.3 96.4 96.8 96.9  PLT 366 303 311 241 267   Basic Metabolic Panel: Recent Labs  Lab 04/17/20 0401 04/17/20 0401 04/18/20 0415 04/19/20 0358 04/20/20 0559 04/21/20 0559 04/22/20 0539  NA 137   < > 133* 135 133* 137 139  K 3.6   < > 4.1 4.5 4.2 4.3 4.3  CL 100   < > 100 101 100 105 102  CO2 24   < > 23 22 23 23 26   GLUCOSE 130*   < > 106* 104* 113* 103* 106*  BUN 20   < > 21 19 21 21 18   CREATININE 1.17*   < > 1.18* 1.08* 1.77* 1.58* 1.38*   CALCIUM 9.8   < > 9.3 9.9 10.1 10.0 10.0  MG 2.1  --   --  2.0 1.9 2.0 1.9  PHOS  --   --   --  2.7  --  3.2  --    < > = values in this interval not displayed.   GFR: Estimated Creatinine Clearance: 36.4 mL/min (A) (by C-G formula based on SCr of 1.38 mg/dL (H)). Liver Function Tests: Recent Labs  Lab 04/16/20 0646 04/19/20 0358  AST 32 23  ALT 22 18  ALKPHOS 66 51  BILITOT 1.1 0.7  PROT 7.9 6.4*  ALBUMIN 4.2 3.5   No results for input(s): LIPASE, AMYLASE in the last 168 hours. No results for input(s): AMMONIA in the last 168 hours. Coagulation Profile: No results for input(s): INR, PROTIME in the last 168 hours. Cardiac Enzymes: No results for input(s): CKTOTAL, CKMB, CKMBINDEX, TROPONINI in the last 168 hours. BNP (last 3 results) No results for input(s): PROBNP in the last 8760 hours. HbA1C: No results for input(s): HGBA1C in the last 72 hours. CBG: No results for input(s): GLUCAP in the last 168 hours. Lipid Profile: No results for input(s): CHOL, HDL, LDLCALC, TRIG, CHOLHDL, LDLDIRECT in the last 72 hours. Thyroid Function Tests: No results for input(s): TSH, T4TOTAL, FREET4, T3FREE, THYROIDAB in the last 72 hours. Anemia Panel: No results for input(s): VITAMINB12, FOLATE, FERRITIN, TIBC, IRON, RETICCTPCT in the last 72 hours. Sepsis Labs: No results for input(s): PROCALCITON, LATICACIDVEN in the last 168 hours.  Recent Results (from the past 240 hour(s))  SARS Coronavirus 2 by RT PCR (hospital order, performed in [facility] hospital lab) Nasopharyngeal Nasopharyngeal Swab     Status: None   Collection Time: 04/15/20  2:28 PM   Specimen: Nasopharyngeal Swab  Result Value Ref Range Status   SARS Coronavirus 2 NEGATIVE NEGATIVE Final    Comment: (NOTE) SARS-CoV-2 target nucleic acids are NOT DETECTED. The SARS-CoV-2 RNA is generally detectable in upper and lower respiratory specimens during the acute phase of infection. The lowest concentration of SARS-CoV-2  viral copies this assay can detect is 250 copies / mL. A negative result does not preclude SARS-CoV-2 infection and should not be used as the sole basis for treatment or other patient management decisions.  A negative result may occur with improper specimen collection / handling, submission of specimen other than nasopharyngeal swab, presence of viral mutation(s) within the areas targeted by this assay, and inadequate number of viral copies (<250 copies / mL). A negative result must be combined with clinical observations, patient history, and epidemiological information. Fact Sheet for Patients:   [facility] Fact Sheet for Healthcare  Providers: https://pope.com/ This test is not yet approved or cleared  by the [place] and has been authorized for detection and/or diagnosis of SARS-CoV-2 by FDA under an Emergency Use Authorization (EUA).  This EUA will remain in effect (meaning this test can be used) for the duration of the COVID-19 declaration under Section 564(b)(1) of the Act, 21 U.S.C. section 360bbb-3(b)(1), unless the authorization is terminated or revoked sooner. Performed at [facility], [address], [place], [place] [address]       Radiology Studies: No results found.   LOS: 7 days   Lanae Boast, MD [facility]  04/22/2020, 1:45 PM

## 2020-04-22 NOTE — Progress Notes (Signed)
Kristin Grimes moved closer to nurses station to room 1601 for her safety. This nurse will call pt's husband and update.

## 2020-04-23 ENCOUNTER — Inpatient Hospital Stay (HOSPITAL_COMMUNITY): Payer: Medicare PPO

## 2020-04-23 DIAGNOSIS — Z66 Do not resuscitate: Secondary | ICD-10-CM

## 2020-04-23 DIAGNOSIS — K449 Diaphragmatic hernia without obstruction or gangrene: Secondary | ICD-10-CM

## 2020-04-23 DIAGNOSIS — G301 Alzheimer's disease with late onset: Secondary | ICD-10-CM

## 2020-04-23 DIAGNOSIS — Z515 Encounter for palliative care: Secondary | ICD-10-CM

## 2020-04-23 DIAGNOSIS — F0281 Dementia in other diseases classified elsewhere with behavioral disturbance: Secondary | ICD-10-CM

## 2020-04-23 LAB — COMPREHENSIVE METABOLIC PANEL
ALT: 37 U/L (ref 0–44)
AST: 36 U/L (ref 15–41)
Albumin: 3.4 g/dL — ABNORMAL LOW (ref 3.5–5.0)
Alkaline Phosphatase: 56 U/L (ref 38–126)
Anion gap: 9 (ref 5–15)
BUN: 16 mg/dL (ref 8–23)
CO2: 27 mmol/L (ref 22–32)
Calcium: 10 mg/dL (ref 8.9–10.3)
Chloride: 102 mmol/L (ref 98–111)
Creatinine, Ser: 1.16 mg/dL — ABNORMAL HIGH (ref 0.44–1.00)
GFR calc Af Amer: 53 mL/min — ABNORMAL LOW (ref >60)
GFR calc non Af Amer: 45 mL/min — ABNORMAL LOW (ref >60)
Glucose, Bld: 96 mg/dL (ref 70–99)
Potassium: 4.4 mmol/L (ref 3.5–5.1)
Sodium: 138 mmol/L (ref 135–145)
Total Bilirubin: 0.5 mg/dL (ref 0.3–1.2)
Total Protein: 6.4 g/dL — ABNORMAL LOW (ref 6.5–8.1)

## 2020-04-23 LAB — AMMONIA: Ammonia: 14 umol/L (ref 9–35)

## 2020-04-23 MED ORDER — DIVALPROEX SODIUM 250 MG PO DR TAB
500.0000 mg | DELAYED_RELEASE_TABLET | Freq: Two times a day (BID) | ORAL | Status: DC
  Administered 2020-04-23 – 2020-04-26 (×8): 500 mg via ORAL
  Filled 2020-04-23 (×8): qty 2

## 2020-04-23 NOTE — Progress Notes (Signed)
Attempted to get pt for MRI, need family to screen patient and they were in a family meeting with MD Spoke to [facility] RN   MRI has left and now is only on call for STAT patients If we get called in, we will attempt her then

## 2020-04-23 NOTE — Progress Notes (Signed)
PROGRESS NOTE    Kristin Grimes  CZY:[id number] DOB: 1941/05/23 DOA: 04/15/2020 PCP: Patient, No Pcp Per   Chef Complaints: Agitation  Brief Narrative: As Per previous MD 79 y.o.femalewithunknown medical history other than advanced dementia, questionable esophageal surgery unspecified, who recently moved from [place] with her husband presented to hospital with worsening mental status for the last 4 weeks since the move.  Family was concerned that it was a psychotic issue and the patient has been refusing to take p.o. with prior history of esophageal surgery.  There is some question about the syncopal event over the past few weeks.  Patient was brought into the hospital because of worsening mental status and combativeness with family at home.   In the ED, patient was evaluated. Routine lab work was obtained, patient became markedly combative requiring 2 mg of IV Haldol with minimal to no improvement, subsequently patient was given 40 mg of Geodon p.o. with moderate improvement but ultimately decision was made for one-to-one sitter to be at bedside.Patient had routine imaging including CT head given mental status changes and chest x-ray with no overt findings. Patient's lab work was equally as unimpressive with minimally elevated glucose but otherwise unremarkable for any acute findings.  Patient was then admitted to the hospital for further evaluation and treatment.   Patient was monitored followed up.  No obvious infection identified CMP has been fairly stable. Mild elevated creatinine peaked to 1.7 and downtrending since Vitamin B12 at 2687, TSH elevated at 82.5 with low free T4 low at 0.54 ( nl 0.61-1.12) FT3 0.9 Underwent esophagogram that showed pevious fundoplication, diverticulum  On 6/11 Husband reports they moved from [place] 4-week ago 1 week before admission patient has been increasingly restless agitated not sleeping and has not been eating which prompted to the ED  visit as husband was unable to take care of her.  In [place] patient was displaying short-term memory loss with frequent forgetting asking questions about was dressing herself able to feed herself going out for dinner and ambulating.   Subjective:  Patient took her medication with applesauce this morning, appears sleepy Mumbles words. Has been afebrile overnight, No family at bedside today. CREAT Further improved 1.1, ammonia 14  Assessment & Plan:  Advanced dementia with behavioral issues agitation: Per husband patient had short-term memory issues but was able to feed herself prior to moving from [place] 4 weeks ago and for 1 week prior to admission patient has been extremely agitated not sleeping.  Unclear what triggered her decline could be changing her location versus worsening of her dementia versus precipitated by her hypothyroidism due to noncompliance.  Has not been seen by neurology will obtain neuro eval-discussed with Dr. Danton Clap advise EEG on Monday and obtain MRI brain over the weekend.  Initially she has been combative and agitated needing security at bedside, at times restrained.  CT head no acute finding LFTs stable no obvious infection, afebrile UA showed ketones 20+ WBC 0-5.  Chest x-ray with bibasilar opacities likely atelectasis, ammonia level normal. Seen by psychiatry to adjust medication-start Depakote 500 mg twice daily with plan to increase to 500 twice daily after 2 days, recommend not continue Geodon, continue Zyprexa 5 twice daily, as needed Zyprexa 10 mg limit 30 mg in a day. B12 remains on hold due to level being high.  Had leukocytosis on presentation, but resolved since.  Appreciate psychiatry follow-up to manage medication.  Dehydration/ketosis the urine:from poor oral intake.  Encourage oral hydration. 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 Husband did not wish to  pursue on further invasive work-up for this.    Continue dysphagia diet, PPI, diet as tolerated.  Feeding assistance as needed.   History of gaseous distention/dyspepsia continue Protonix, simethicone and MiraLAX.  Husband reports patient not having bowel movement for a week.  Can try Dulcolax suppository if needed  ?Syncopal event: Difficult to ascertain question orthostatics given patient's poor intake  Mild AKI with elevated creatinine: Creatinine is improving.  Encourage hydration.  Holding lisinopril.   Hypertension: On nifedipine lisinopril and Coreg at home-holding all meds for now, held coreg this am   Hypothyroidism TSH elevated  free T4 on lower side likely from noncompliance.TSH elevated at 82.5 with low free T4 low at 0.54.  New home Synthroid.  Eye irritation:continue  eye drop.  DVT prophylaxis:lovenox Code Status: FULL Family Communication: plan of care discussed with patient's husband at bedside 6/11.  Status is: Inpatient Remains inpatient appropriate because:Unsafe d/c plan and Ongoing confusion/dementia with agitation  Dispo: Patient From: Home  Planned Disposition: To be determined [facility] psych has refused.  PT OT has advised skilled nursing facility  Expected discharge date: 2-3 days  Medically stable for discharge: No  Diet Order            DIET - DYS 1 Room service appropriate? Yes; Fluid consistency: Thin  Diet effective now                 Body mass index is 30.55 kg/m. Consultants: Psychiatry, neurology Procedures:see note Microbiology:see note  Medications: Scheduled Meds:  carvedilol  3.125 mg Oral BID WC   enoxaparin (LOVENOX) injection  40 mg Subcutaneous Q24H   escitalopram  20 mg Oral Daily   levothyroxine  112 mcg Oral QAC breakfast   OLANZapine  5 mg Oral BID   pantoprazole  40 mg Oral Daily   polyethylene glycol  17 g Oral Daily   polyvinyl alcohol  1 drop Both Eyes BID   simethicone  80 mg Oral QID   Continuous  Infusions: Antimicrobials: Anti-infectives (From admission, onward)   None    Objective: Vitals: Today's Vitals   04/22/20 2210 04/23/20 0628 04/23/20 1028 04/23/20 1029  BP: (!) 111/51 131/75 128/71   Pulse: (!) 54 (!) 55 (!) 56   Resp: 20 17 14    Temp: 97.6 F (36.4 C) (!) 97.4 F (36.3 C)  98.5 F (36.9 C)  TempSrc: Oral Oral  Oral  SpO2: 94% 94% 93%   Weight:      Height:      PainSc:        Intake/Output Summary (Last 24 hours) at 04/23/2020 1143 Last data filed at 04/22/2020 1850 Gross per 24 hour  Intake 960 ml  Output 450 ml  Net 510 ml   Filed Weights   04/16/20 0116 04/19/20 1255  Weight: 82.1 kg 83.3 kg   Weight change:    Intake/Output from previous day: 06/11 0701 - 06/12 0700 In: 960 [P.O.:960] Out: 450 [Urine:450] Intake/Output this shift: No intake/output data recorded.  Examination:  General exam:Mumbling words,sleepy,not in distress.   HEENT:Oral mucosa moist, Ear/Nose WNL grossly, dentition normal. Respiratory system: bilaterally clear,no wheezing or crackles,no use of accessory muscle Cardiovascular system: S1 & S2 +, No JVD,. Gastrointestinal system: Abdomen soft, NT,ND, BS+ Nervous System: sleepy, mumbling words. Moving extremities Extremities: No edema, distal peripheral pulses palpable.  Skin: No rashes,no icterus. MSK: Normal muscle  bulk,tone, power  Data Reviewed: I have personally reviewed following labs and imaging studies CBC: Recent Labs  Lab 04/18/20 0415 04/19/20 0358 04/20/20 0559 04/21/20 0559 04/22/20 0539  WBC 7.3 8.1 10.3 7.1 6.6  HGB 14.9 15.6* 14.8 12.9 13.1  HCT 45.7 47.3* 45.0 39.3 40.1  MCV 96.8 96.3 96.4 96.8 96.9  PLT 366 303 311 241 267   Basic Metabolic Panel: Recent Labs  Lab 04/17/20 0401 04/18/20 0415 04/19/20 0358 04/20/20 0559 04/21/20 0559 04/22/20 0539 04/23/20 0512  NA 137   < > 135 133* 137 139 138  K 3.6   < > 4.5 4.2 4.3 4.3 4.4  CL 100   < > 101 100 105 102 102  CO2 24   < > 22  23 23 26 27   GLUCOSE 130*   < > 104* 113* 103* 106* 96  BUN 20   < > 19 21 21 18 16   CREATININE 1.17*   < > 1.08* 1.77* 1.58* 1.38* 1.16*  CALCIUM 9.8   < > 9.9 10.1 10.0 10.0 10.0  MG 2.1  --  2.0 1.9 2.0 1.9  --   PHOS  --   --  2.7  --  3.2  --   --    < > = values in this interval not displayed.   GFR: Estimated Creatinine Clearance: 43.3 mL/min (A) (by C-G formula based on SCr of 1.16 mg/dL (H)). Liver Function Tests: Recent Labs  Lab 04/19/20 0358 04/23/20 0512  AST 23 36  ALT 18 37  ALKPHOS 51 56  BILITOT 0.7 0.5  PROT 6.4* 6.4*  ALBUMIN 3.5 3.4*   No results for input(s): LIPASE, AMYLASE in the last 168 hours. Recent Labs  Lab 04/23/20 0811  AMMONIA 14   Coagulation Profile: No results for input(s): INR, PROTIME in the last 168 hours. Cardiac Enzymes: No results for input(s): CKTOTAL, CKMB, CKMBINDEX, TROPONINI in the last 168 hours. BNP (last 3 results) No results for input(s): PROBNP in the last 8760 hours. HbA1C: No results for input(s): HGBA1C in the last 72 hours. CBG: No results for input(s): GLUCAP in the last 168 hours. Lipid Profile: No results for input(s): CHOL, HDL, LDLCALC, TRIG, CHOLHDL, LDLDIRECT in the last 72 hours. Thyroid Function Tests: No results for input(s): TSH, T4TOTAL, FREET4, T3FREE, THYROIDAB in the last 72 hours. Anemia Panel: No results for input(s): VITAMINB12, FOLATE, FERRITIN, TIBC, IRON, RETICCTPCT in the last 72 hours. Sepsis Labs: No results for input(s): PROCALCITON, LATICACIDVEN in the last 168 hours.  Recent Results (from the past 240 hour(s))  SARS Coronavirus 2 by RT PCR (hospital order, performed in [facility] hospital lab) Nasopharyngeal Nasopharyngeal Swab     Status: None   Collection Time: 04/15/20  2:28 PM   Specimen: Nasopharyngeal Swab  Result Value Ref Range Status   SARS Coronavirus 2 NEGATIVE NEGATIVE Final    Comment: (NOTE) SARS-CoV-2 target nucleic acids are NOT DETECTED. The SARS-CoV-2 RNA is  generally detectable in upper and lower respiratory specimens during the acute phase of infection. The lowest concentration of SARS-CoV-2 viral copies this assay can detect is 250 copies / mL. A negative result does not preclude SARS-CoV-2 infection and should not be used as the sole basis for treatment or other patient management decisions.  A negative result may occur with improper specimen collection / handling, submission of specimen other than nasopharyngeal swab, presence of viral mutation(s) within the areas targeted by this assay, and inadequate number of viral copies (<250  copies / mL). A negative result must be combined with clinical observations, patient history, and epidemiological information. Fact Sheet for Patients:   StrictlyIdeas.no Fact Sheet for Healthcare Providers: BankingDealers.co.za This test is not yet approved or cleared  by the [place] FDA and has been authorized for detection and/or diagnosis of SARS-CoV-2 by FDA under an Emergency Use Authorization (EUA).  This EUA will remain in effect (meaning this test can be used) for the duration of the COVID-19 declaration under Section 564(b)(1) of the Act, 21 U.S.C. section 360bbb-3(b)(1), unless the authorization is terminated or revoked sooner. Performed at [facility], [place] [address], [place], [place] [address]       Radiology Studies: No results found.   LOS: 8 days   Antonieta Pert, MD [facility]  04/23/2020, 11:43 AM

## 2020-04-23 NOTE — Consult Note (Signed)
Consultation Note Date: 04/23/2020   Patient Name: Kristin Grimes  DOB: Apr 14, 1941  MRN: [id number]  Age / Sex: 79 y.o., female  PCP: Patient, No Pcp Per Referring Physician: Antonieta Pert, MD  Reason for Consultation: Establishing goals of care and Psychosocial/spiritual support  HPI/Patient Profile: 79 y.o. female  with past medical history of severe scoliosis, CAD s/p stenting, severe reflux s/p nissan fundiplication, rheumatoid arthritis, and dementia who was admitted on 04/15/2020 with acute altered mental status changes.   She has been seen by Psychiatry inpatient who recommended Zyprexa bid, haldol as needed and inpatient psychiatric admission.  Neurology has recommended MRI and EEG given acute changes.  These tests are pending.  Clinical Assessment and Goals of Care:  I have reviewed medical records including EPIC notes, labs and imaging, received report from Kristin Grimes, examined the patient and met at bedside with her husband Kristin Grimes, son Kristin Grimes and spoke on the phone with her daughter Kristin Grimes  to discuss diagnosis prognosis, [place], EOL wishes, disposition and options.  I introduced Palliative Medicine as specialized medical care for people living with serious illness. It focuses on providing relief from the symptoms and stress of a serious illness.   We discussed a brief life review of the patient.  She has been married to [place] for 74 years.  They have two children.  Kristin Grimes describes [name]'s last 10+ years with dementia.  It started off with simple forgetfulness over the years it progressed into more serious memory loss.  Symptoms seemed episodic there would be days when [name] was completely functional - able to dress herself, go shopping with Kristin Grimes, and then out to dinner.  Other days she needed much more assistance but seemed to have quality of life.  Kristin Grimes talks about the severe problems she had with  reflux.  She would eat and pass out.  This happened so frequently that she became afraid to eat and lost 40 lbs.  Despite this [name] stubbornly refused surgery "It's my body!".  Kristin Grimes describes his wife as stubborn and independent.  Finally she had the surgery (2015) and it cured her symptoms.  She regained the 40 lbs.  Kristin Grimes and [name] moved to [place] from [place] to be near [employer] as he had moved here 6 months earlier. The move was very difficult for [facility].  She is a Ship broker.  Having her family sort thru her belongings, donate some of them, and pack the rest up caused her great distress.  The family then had an arduous journey with the moving van.  Finally they arrived in [name] and unloaded.  [name] was doing ok.  She was talking, walking, and able to go out with her family.     Days prior to admission [name] became agitated getting up and down all night long and not sleeping.  This also prevented Kristin Grimes from sleeping for several nights straight.  Just before admission Benna laid diagonally across the bed with full body tremors, groaning and unable to interact with her family.  The situation climaxed with  Kristin Grimes's blood pressure climbing to the point where he developed tingling in his fingers and was concerned about having a stroke.  Kristin Grimes intervened and called 911 for his parents.    As far as functional and nutritional status she was having difficulty with episodes of abdominal pain and vomiting just prior to admission.  She was able to walk but needed help with ADLs.  Currently she is on a pureed diet and will eat when fed.  She was evaluated by PT and was unable to stand / walk on her own.  We discussed her current illness and what it means in the larger context of her on-going co-morbidities.   Work up is on-going attempting to determine if something has happened acutely or if these symptoms are the result of progression of Alzheimers dementia.  The difference between  aggressive medical intervention and comfort care was considered in light of the patient's goals of care. Advanced directives, concepts specific to code status, artifical feeding and hydration, and rehospitalization were considered and discussed.  Kristin Grimes, and Kristin Grimes agreed that if [facility] arrested she would not want resuscitation.  We reviewed a MOST form.  Which the family decided to complete after we have the result from her acute work up.  This information will allow them to determine whether she will continue to seek more aggressive care (hospitalization) or if they would prefer to focus on her comfort [name]).  Hospice and Palliative Care services will be discussed at a time when appropriate.  Questions and concerns were addressed.  The family was encouraged to call with questions or concerns.    Primary Decision Maker:  NEXT OF KIN Husband Kristin Grimes.  He is supported by their children Kristin Grimes and Kristin Grimes.    SUMMARY OF RECOMMENDATIONS     Code status changed to DNR with full scope treatment (if she arrests do not resuscitate)  Patient needs safe and appropriate discharge when medically appropriate.  Her husband and family are unable to care for her at home.   She will need placement.  Delirium precautions ordered.  Patient responds well to smooth jazz music  Needs feeding assistance until mental status improves.  Anticipate discharge when appropriate to either Geri-psych inpatient unit or if her acute delirium has resolved she will likely be appropriate for a good memory care unit.  She will be followed by either Palliative Care or Hospice on discharge.  PMT will continue to follow inpatient.  Code Status/Advance Care Planning:  DNR   Symptom Management:   Per Psychiatry.  Additional Recommendations (Limitations, Scope, Preferences):  Full Scope Treatment  Palliative Prophylaxis:   Bowel Regimen, Delirium Protocol, Turn Reposition and feeding assistance    Psycho-social/Spiritual:   Desire for further Chaplaincy support: politely refused  Prognosis: Unable to determine.    Discharge Planning: To Be Determined      Primary Diagnoses: Present on Admission: . Dementia with behavioral disturbance ([place])   I have reviewed the medical record, interviewed the patient and family, and examined the patient. The following aspects are pertinent.  Past Medical History:  Diagnosis Date  . Hypothyroidism    Social History   Socioeconomic History  . Marital status: Married    Spouse name: Not on file  . Number of children: Not on file  . Years of education: Not on file  . Highest education level: Not on file  Occupational History  . Not on file  Tobacco Use  . Smoking status: Former Smoker    Types: Cigarettes  .  Smokeless tobacco: Never Used  Vaping Use  . Vaping Use: Never used  Substance and Sexual Activity  . Alcohol use: Yes    Comment: occasional  . Drug use: Never  . Sexual activity: Not on file  Other Topics Concern  . Not on file  Social History Narrative  . Not on file   Social Determinants of Health   Financial Resource Strain:   . Difficulty of Paying Living Expenses:   Food Insecurity:   . Worried About Charity fundraiser in the Last Year:   . Arboriculturist in the Last Year:   Transportation Needs:   . Film/video editor (Medical):   Marland Kitchen Lack of Transportation (Non-Medical):   Physical Activity:   . Days of Exercise per Week:   . Minutes of Exercise per Session:   Stress:   . Feeling of Stress :   Social Connections:   . Frequency of Communication with Friends and Family:   . Frequency of Social Gatherings with Friends and Family:   . Attends Religious Services:   . Active Member of Clubs or Organizations:   . Attends Archivist Meetings:   Marland Kitchen Marital Status:    History reviewed. No pertinent family history.  No Known Allergies   Vital Signs: BP 128/71 (BP Location: Left Arm)    Pulse (!) 56   Temp 98.5 F (36.9 C) (Oral)   Resp 14   Ht _0  (1.651 m)   Wt 83.3 kg   SpO2 93%   BMI 30.55 kg/m  Pain Scale: 0-10 POSS *See Group Information*: S-Acceptable,Sleep, easy to arouse Pain Score: 0-No pain   SpO2: SpO2: 93 % O2 Device:SpO2: 93 % O2 Flow Rate: .O2 Flow Rate (L/min): 0 L/min    Palliative Assessment/Data: 30%     Time In: 1:00 Time Out: 2:30 Time Total: 90 min. Visit consisted of counseling and education dealing with the complex and emotionally intense issues surrounding the need for palliative care and symptom management in the setting of serious and potentially life-threatening illness. Greater than 50%  of this time was spent counseling and coordinating care related to the above assessment and plan.  Signed by: Florentina Jenny, PA-C Palliative Medicine  Please contact Palliative Medicine Team phone at [phone] for questions and concerns.  For individual provider: See Shea Evans

## 2020-04-24 ENCOUNTER — Inpatient Hospital Stay (HOSPITAL_COMMUNITY): Payer: Medicare PPO

## 2020-04-24 LAB — BASIC METABOLIC PANEL
Anion gap: 10 (ref 5–15)
BUN: 23 mg/dL (ref 8–23)
CO2: 27 mmol/L (ref 22–32)
Calcium: 9.9 mg/dL (ref 8.9–10.3)
Chloride: 103 mmol/L (ref 98–111)
Creatinine, Ser: 1.41 mg/dL — ABNORMAL HIGH (ref 0.44–1.00)
GFR calc Af Amer: 42 mL/min — ABNORMAL LOW (ref >60)
GFR calc non Af Amer: 36 mL/min — ABNORMAL LOW (ref >60)
Glucose, Bld: 97 mg/dL (ref 70–99)
Potassium: 4.7 mmol/L (ref 3.5–5.1)
Sodium: 140 mmol/L (ref 135–145)

## 2020-04-24 MED ORDER — LORAZEPAM 2 MG/ML IJ SOLN
0.2500 mg | Freq: Once | INTRAMUSCULAR | Status: AC | PRN
  Administered 2020-04-24: 0.25 mg via INTRAVENOUS
  Filled 2020-04-24: qty 1

## 2020-04-24 MED ORDER — OLANZAPINE 5 MG PO TABS
2.5000 mg | ORAL_TABLET | Freq: Every day | ORAL | Status: DC
  Administered 2020-04-25 – 2020-04-26 (×2): 2.5 mg via ORAL
  Filled 2020-04-24 (×2): qty 1

## 2020-04-24 MED ORDER — OLANZAPINE 5 MG PO TABS
5.0000 mg | ORAL_TABLET | Freq: Every day | ORAL | Status: DC
  Administered 2020-04-24 – 2020-04-25 (×2): 5 mg via ORAL
  Filled 2020-04-24 (×2): qty 1

## 2020-04-24 MED ORDER — LACTATED RINGERS IV SOLN: INTRAVENOUS | Status: DC

## 2020-04-24 NOTE — Progress Notes (Addendum)
Daily Progress Note   Patient Name: Kristin Grimes       Date: 04/24/2020 DOB: 06-09-1941  Age: 79 y.o. MRN#: [id number] Attending Physician: Antonieta Pert, MD Primary Care Physician: Patient, No Pcp Per Admit Date: 04/15/2020  Reason for Consultation/Follow-up: To discuss complex medical decision making related to patient's goals of care  Initial results of MRI - motion degraded exam.  No acute stroke or midline shift. Creatinine had a small bump from 1.16 to 1.41.   Per RN patient is eating some with feeding assistance but unable to feed herself.  She is dependent for ADLs.  Patient is no longer combative.  Subjective: I met Corene Cornea and Herbie Baltimore (son and husband) at bedside.  Gurneet has her eyes closed.  She does not interact and does not follow commands.   She mumbles when I tickle her feet. Talked with husband and son about dementia as a disease not just of the memory but of the entire brain and all of the body functions the brain controls.  We reviewed the stages of dementia and specifically the Alzheimer's FAST scale.  Family confirms that just prior to admission Nikita was stage 7.  Since she has been in the hospital she has been demonstrating stage 7D.    Family shared stories of Nikeya passing thru each of the stages.  We discussed the results of her MRI.  EEG is still outstanding.  Then Neurology will weigh in with recommendations.  I took Herbie Baltimore and Corene Cornea to another room and we discussed best case and worst case scenarios - best case [place] responds to the medications, she eats well, remains non-combative and is able to discharge to an ALF or SNF with Palliative to follow.     Worst case scenario - [place] does not respond well to the medications and remains as she is now  - barely eating, not interactive, not functional at all.  I explained that in our worst case scenario [place] would be Hospice eligible.  This was quite surprising to the family.  We discussed the different hospice arrangements (at SNF vs [facility]) the services offered and a general overview of medicare benefit and cost.  Herbie Baltimore and Parker felt that Lattie Haw (dtr) may have questions so we added her on the phone and had the conversation once again.  Lattie Haw was  surprised at the mention of hospice.  I explained that I was giving best and worst case scenarios in order to give the family time to process and ask questions.  The family expressed concerned that now Shantese is too sedate - perhaps she is being over medicated.  We discussed lowering her morning dose of Olanzepine to see if she could become more alert and interactive.    Family expressed deep concerns  - about transition and placement.  Conversations with the hospital about placement have caused the family great stress and anxiety.  They ask that we work together to jointly plan where she will be placed when she is medically appropriate for discharge.    Assessment: Patient not interactive, sedate, waiting for work up to be completed and neurology to weigh in. Family has been told symptoms are likely due to very advanced dementia.  Patient Profile/HPI:  79 y.o. female  with past medical history of severe scoliosis, CAD s/p stenting, severe reflux s/p nissan fundiplication, rheumatoid arthritis, and dementia who was admitted on 04/15/2020 with acute altered mental status changes.   She has been seen by Psychiatry inpatient who recommended Zyprexa bid, haldol as needed and inpatient psychiatric admission.  Neurology has recommended MRI and EEG given acute changes.  These tests are pending.    Length of Stay: 9   Vital Signs: BP 129/84 (BP Location: Left Arm)    Pulse (!) 58    Temp 97.8 F (36.6 C) (Oral)    Resp 14    Ht 5' 5"  (1.651 m)     Wt 83.3 kg    SpO2 95%    BMI 30.55 kg/m  SpO2: SpO2: 95 % O2 Device: O2 Device: Room Air O2 Flow Rate: O2 Flow Rate (L/min): 0 L/min       Palliative Assessment/Data:  30%     Palliative Care Plan    Recommendations/Plan:   Patient is no longer combative.  Reduced am dose of Zyprexa by half (from 5.0 to 2.5) to see if she is able to engage in a more meaningful fashion. Leave PM dose at 5.0  (Discussed with Dr. Maren Beach)  EEG and Neurology opinion still pending.  Concern over placement has generated a great deal of anxiety for the family.  Family reassured that we will continue to work with them in planning for placement.  TOC has spent a great deal of effort investigating placement options with no success over the past week.  However, now, she is no longer combative and taking small amounts of PO.  When her work up is complete and she remains stable - readdressing disposition may be more fruitful.  Need to complete a MOST form prior to discharge.  Code Status:  DNR  Prognosis:   Unable to determine.  Based on her current presentation and poor PO intake she has less than 6 months.  If no improvement she may be eligible for Hospice Facility.     Discharge Planning:  To Be Determined.  PMT is allowing time for acute work up and improvement from treatment before offering appropriate recommendations for disposition.  Care plan was discussed with Dr. Maren Beach  Thank you for allowing the Palliative Medicine Team to assist in the care of this patient.  Total time spent:  105 min Time in 3:15 pm Time out 5:00 pm     Greater than 50%  of this time was spent counseling and coordinating care related to the above assessment and plan.  Florentina Jenny,  PA-C Palliative Medicine  Please contact Palliative MedicineTeam phone at [phone] for questions and concerns between 7 am - 7 pm.   Please see AMION for individual provider pager numbers.

## 2020-04-24 NOTE — Progress Notes (Signed)
PROGRESS NOTE    Kristin Grimes  KNL:[id number] DOB: 03/07/1941 DOA: 04/15/2020 PCP: Patient, No Pcp Per   Chef Complaints: Agitation  Brief Narrative: As Per previous MD 79 y.o.femalewithunknown medical history other than advanced dementia, questionable esophageal surgery unspecified, who recently moved from [place] with her husband presented to hospital with worsening mental status for the last 4 weeks since the move.  Family was concerned that it was a psychotic issue and the patient has been refusing to take p.o. with prior history of esophageal surgery.  There is some question about the syncopal event over the past few weeks.  Patient was brought into the hospital because of worsening mental status and combativeness with family at home.   In the ED, patient was evaluated. Routine lab work was obtained, patient became markedly combative requiring 2 mg of IV Haldol with minimal to no improvement, subsequently patient was given 40 mg of Geodon p.o. with moderate improvement but ultimately decision was made for one-to-one sitter to be at bedside.Patient had routine imaging including CT head given mental status changes and chest x-ray with no overt findings. Patient's lab work was equally as unimpressive with minimally elevated glucose but otherwise unremarkable for any acute findings.  Patient was then admitted to the hospital for further evaluation and treatment.   Patient was monitored followed up.  No obvious infection identified CMP has been fairly stable. Mild elevated creatinine peaked to 1.7 and downtrending since Vitamin B12 at 2687, TSH elevated at 82.5 with low free T4 low at 0.54 ( nl 0.61-1.12) FT3 0.9 Underwent esophagogram that showed pevious fundoplication, diverticulum  On 6/11 Husband reports they moved from [place] 4-week ago 1 week before admission patient has been increasingly restless agitated not sleeping and has not been eating which prompted to the ED  visit as husband was unable to take care of her.  In [place] patient was displaying short-term memory loss with frequent forgetting asking questions about was dressing herself able to feed herself going out for dinner and ambulating.   Subjective: Seen this morning, her eyes are closed, able to tell me her name and that she is on the bed in the hospital. Reports she is okay doing MRI later today. Creatinine slightly up at 1.4 IV fluids  Assessment & Plan:  Advanced dementia with behavioral issues/agitation: Per husband patient had short-term memory issues but was able to feed herself prior to moving from [place] 4 weeks ago and for 1 week prior to admission patient has been extremely agitated not sleeping.  Unclear what triggered her decline could be changing her location versus worsening of her dementia versus precipitated by her hypothyroidism due to noncompliance.  I discussed with Dr. Amada Jupiter from neurology who advised MRI and getting MRI today, with low-dose IV Ativan for claustrophobia.  Neurology has requested to call them back after obtaining MRI and EEG results. Initially she has been combative and agitated needing security at bedside, at times restrained.  CT head no acute finding LFTs stable no obvious infection, afebrile UA showed ketones 20+ WBC 0-5.  Chest x-ray with bibasilar opacities likely atelectasis, ammonia level normal. Seen by psychiatry to adjust medication-start Depakote 500 mg twice daily with plan to increase to 500 twice daily after 2 days, recommend not continue Geodon, continue Zyprexa 5 twice daily, as needed Zyprexa 10 mg limit 30 mg in a day. B12 remains on hold due to level being high.  Had leukocytosis on presentation, but resolved since.  Appreciate psychiatry input. Dehydration/ketosis  the urine:from poor oral intake.  Encourage oral hydration. 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 Husband did not wish to pursue on further invasive work-up for this.    Continue dysphagia diet, PPI, diet as tolerated.  Feeding assistance as needed.   History of gaseous distention/dyspepsia continue Protonix, simethicone and MiraLAX.  Husband reports patient not having bowel movement for a week.  Can try Dulcolax suppository if needed  ?Syncopal event: Difficult to ascertain question orthostatics given patient's poor intake  Mild AKI with poor oral intake.  Added gentle IV rehydration.  Creatinine slightly up 1.4.  Monitor closely.  Hold lisinopril.    Hypertension: On nifedipine lisinopril and Coreg at home-holding lisinopril and nifedipine.  Continue Coreg with holding parameters.    Hypothyroidism TSH elevated  free T4 on lower side likely from noncompliance.TSH elevated at 82.5 with low free T4 low at 0.54.  Patient is back on home Synthroid.  Repeat TSH.  Eye irritation:continue  eye drop.  DVT prophylaxis:lovenox Code Status: FULL Family Communication: plan of care discussed with patient's husband at bedside 6/11.  Status is: Inpatient Remains inpatient appropriate because:Unsafe d/c plan and Ongoing confusion/dementia with agitation  Dispo: Patient From: Home  Planned Disposition: To be determined [facility] has refused.  PT OT has advised skilled nursing facility  Expected discharge date: 2-3 days  Medically stable for discharge: No  Diet Order            DIET - DYS 1 Room service appropriate? Yes; Fluid consistency: Thin  Diet effective now                 Body mass index is 30.55 kg/m. Consultants: Psychiatry, neurology Procedures:see note Microbiology:see note  Medications: Scheduled Meds: . carvedilol  3.125 mg Oral BID WC  . divalproex  500 mg Oral Q12H  . enoxaparin (LOVENOX) injection  40 mg Subcutaneous Q24H  . escitalopram  20 mg Oral Daily  . levothyroxine  112 mcg Oral QAC breakfast  . OLANZapine  5 mg Oral BID  . pantoprazole   40 mg Oral Daily  . polyethylene glycol  17 g Oral Daily  . polyvinyl alcohol  1 drop Both Eyes BID  . simethicone  80 mg Oral QID   Continuous Infusions: . lactated ringers 75 mL/hr at 04/24/20 1033   Antimicrobials: Anti-infectives (From admission, onward)   None    Objective: Vitals: Today's Vitals   04/23/20 1750 04/23/20 1931 04/23/20 1950 04/24/20 0639  BP: (!) 108/57 130/78  129/84  Pulse: 66 61  (!) 58  Resp: 16 16  14   Temp: 98 F (36.7 C) 97.7 F (36.5 C)  97.8 F (36.6 C)  TempSrc: Oral Oral  Oral  SpO2: 92% 97%  95%  Weight:      Height:      PainSc:   0-No pain     Intake/Output Summary (Last 24 hours) at 04/24/2020 1045 Last data filed at 04/23/2020 1250 Gross per 24 hour  Intake 0 ml  Output --  Net 0 ml   Filed Weights   04/16/20 0116 04/19/20 1255  Weight: 82.1 kg 83.3 kg   Weight change:    Intake/Output from previous day: No intake/output data recorded. Intake/Output this shift: No intake/output data recorded.  Examination: General exam: Eyes closed, able to tell me her name, mumbling words, on room air HEENT:Oral mucosa moist, Ear/Nose WNL grossly, dentition normal. Respiratory system:  bilaterally clear no wheezing or crackles,no use of accessory muscle Cardiovascular system: S1 & S2 +, No JVD,. Gastrointestinal system: Abdomen soft, NT,ND, BS+ Nervous System: Eyes closed but follows some commands mumbling words, able to move all her extremities.   Extremities: No edema, distal peripheral pulses palpable.  Skin: No rashes,no icterus. MSK: Normal muscle bulk,tone, power    Data Reviewed: I have personally reviewed following labs and imaging studies CBC: Recent Labs  Lab 04/18/20 0415 04/19/20 0358 04/20/20 0559 04/21/20 0559 04/22/20 0539  WBC 7.3 8.1 10.3 7.1 6.6  HGB 14.9 15.6* 14.8 12.9 13.1  HCT 45.7 47.3* 45.0 39.3 40.1  MCV 96.8 96.3 96.4 96.8 96.9  PLT 366 303 311 241 267   Basic Metabolic Panel: Recent Labs  Lab  04/19/20 0358 04/19/20 0358 04/20/20 0559 04/21/20 0559 04/22/20 0539 04/23/20 0512 04/24/20 0546  NA 135   < > 133* 137 139 138 140  K 4.5   < > 4.2 4.3 4.3 4.4 4.7  CL 101   < > 100 105 102 102 103  CO2 22   < > 23 23 26 27 27   GLUCOSE 104*   < > 113* 103* 106* 96 97  BUN 19   < > 21 21 18 16 23   CREATININE 1.08*   < > 1.77* 1.58* 1.38* 1.16* 1.41*  CALCIUM 9.9   < > 10.1 10.0 10.0 10.0 9.9  MG 2.0  --  1.9 2.0 1.9  --   --   PHOS 2.7  --   --  3.2  --   --   --    < > = values in this interval not displayed.   GFR: Estimated Creatinine Clearance: 35.6 mL/min (A) (by C-G formula based on SCr of 1.41 mg/dL (H)). Liver Function Tests: Recent Labs  Lab 04/19/20 0358 04/23/20 0512  AST 23 36  ALT 18 37  ALKPHOS 51 56  BILITOT 0.7 0.5  PROT 6.4* 6.4*  ALBUMIN 3.5 3.4*   No results for input(s): LIPASE, AMYLASE in the last 168 hours. Recent Labs  Lab 04/23/20 0811  AMMONIA 14   Coagulation Profile: No results for input(s): INR, PROTIME in the last 168 hours. Cardiac Enzymes: No results for input(s): CKTOTAL, CKMB, CKMBINDEX, TROPONINI in the last 168 hours. BNP (last 3 results) No results for input(s): PROBNP in the last 8760 hours. HbA1C: No results for input(s): HGBA1C in the last 72 hours. CBG: No results for input(s): GLUCAP in the last 168 hours. Lipid Profile: No results for input(s): CHOL, HDL, LDLCALC, TRIG, CHOLHDL, LDLDIRECT in the last 72 hours. Thyroid Function Tests: No results for input(s): TSH, T4TOTAL, FREET4, T3FREE, THYROIDAB in the last 72 hours. Anemia Panel: No results for input(s): VITAMINB12, FOLATE, FERRITIN, TIBC, IRON, RETICCTPCT in the last 72 hours. Sepsis Labs: No results for input(s): PROCALCITON, LATICACIDVEN in the last 168 hours.  Recent Results (from the past 240 hour(s))  SARS Coronavirus 2 by RT PCR (hospital order, performed in [facility] hospital lab) Nasopharyngeal Nasopharyngeal Swab     Status: None   Collection Time:  04/15/20  2:28 PM   Specimen: Nasopharyngeal Swab  Result Value Ref Range Status   SARS Coronavirus 2 NEGATIVE NEGATIVE Final    Comment: (NOTE) SARS-CoV-2 target nucleic acids are NOT DETECTED. The SARS-CoV-2 RNA is generally detectable in upper and lower respiratory specimens during the acute phase of infection. The lowest concentration of SARS-CoV-2 viral copies this assay can detect is 250 copies / mL.  A negative result does not preclude SARS-CoV-2 infection and should not be used as the sole basis for treatment or other patient management decisions.  A negative result may occur with improper specimen collection / handling, submission of specimen other than nasopharyngeal swab, presence of viral mutation(s) within the areas targeted by this assay, and inadequate number of viral copies (<250 copies / mL). A negative result must be combined with clinical observations, patient history, and epidemiological information. Fact Sheet for Patients:   StrictlyIdeas.no Fact Sheet for Healthcare Providers: BankingDealers.co.za This test is not yet approved or cleared  by the [place] FDA and has been authorized for detection and/or diagnosis of SARS-CoV-2 by FDA under an Emergency Use Authorization (EUA).  This EUA will remain in effect (meaning this test can be used) for the duration of the COVID-19 declaration under Section 564(b)(1) of the Act, 21 U.S.C. section 360bbb-3(b)(1), unless the authorization is terminated or revoked sooner. Performed at [facility], [place] [address] [place], [place] [address]       Radiology Studies: No results found.   LOS: 9 days   Antonieta Pert, MD [facility]  04/24/2020, 10:45 AM

## 2020-04-25 ENCOUNTER — Inpatient Hospital Stay (HOSPITAL_COMMUNITY)
Admission: EM | Admit: 2020-04-25 | Discharge: 2020-04-25 | Disposition: A | Discharge status: Home / Self Care | Payer: Medicare PPO | Source: Home / Self Care | Attending: Internal Medicine | Admitting: Internal Medicine

## 2020-04-25 LAB — BASIC METABOLIC PANEL
Anion gap: 9 (ref 5–15)
BUN: 23 mg/dL (ref 8–23)
CO2: 26 mmol/L (ref 22–32)
Calcium: 9.2 mg/dL (ref 8.9–10.3)
Chloride: 103 mmol/L (ref 98–111)
Creatinine, Ser: 1.31 mg/dL — ABNORMAL HIGH (ref 0.44–1.00)
GFR calc Af Amer: 45 mL/min — ABNORMAL LOW (ref >60)
GFR calc non Af Amer: 39 mL/min — ABNORMAL LOW (ref >60)
Glucose, Bld: 94 mg/dL (ref 70–99)
Potassium: 4.5 mmol/L (ref 3.5–5.1)
Sodium: 138 mmol/L (ref 135–145)

## 2020-04-25 LAB — TSH: TSH: 44.683 u[IU]/mL — ABNORMAL HIGH (ref 0.350–4.500)

## 2020-04-25 MED ORDER — LEVOTHYROXINE SODIUM 100 MCG/5ML IV SOLN
50.0000 ug | Freq: Every day | INTRAVENOUS | Status: AC
  Administered 2020-04-25 – 2020-04-26 (×2): 50 ug via INTRAVENOUS
  Filled 2020-04-25 (×2): qty 5

## 2020-04-25 MED ORDER — ACETAMINOPHEN 325 MG PO TABS
650.0000 mg | ORAL_TABLET | Freq: Three times a day (TID) | ORAL | Status: DC
  Administered 2020-04-25 – 2020-05-09 (×37): 650 mg via ORAL
  Filled 2020-04-25 (×38): qty 2

## 2020-04-25 MED ORDER — BISACODYL 5 MG PO TBEC
10.0000 mg | DELAYED_RELEASE_TABLET | Freq: Once | ORAL | Status: AC
  Administered 2020-04-25: 10 mg via ORAL
  Filled 2020-04-25: qty 2

## 2020-04-25 NOTE — Care Management Important Message (Signed)
Important Message  Patient Details IM Letter given to Sandford Craze RN Case Manager to present to the Patient Name: Kristin Grimes MRN: [id number] Date of Birth: Jul 21, 1941   Medicare Important Message Given:  Yes     Caren Macadam 04/25/2020, 10:01 AM

## 2020-04-25 NOTE — Progress Notes (Signed)
PROGRESS NOTE    Kristin Grimes  SLH:[id number] DOB: February 24, 1941 DOA: 04/15/2020 PCP: Patient, No Pcp Per   Chef Complaints: Agitation  Brief Narrative: 79 y.o.advanced dementia, questionable esophageal surgery unspecified, who recently moved from [place] with her husband presented to hospital with worsening mental status for the last 4 weeks since the move.   Family was concerned that it was a psychotic issue and the patient has been refusing to take p.o. with prior history of esophageal surgery.   There is some question about the syncopal event over the past few weeks.  Patient was brought into the hospital because of worsening mental status and combativeness with family at home.   In the ED, patient was evaluated. Routine lab work was obtained, patient became markedly combative requiring 2 mg of IV Haldol with minimal to no improvement, subsequently patient was given 40 mg of Geodon p.o. with moderate improvement but ultimately decision was made for one-to-one sitter to be at bedside. Patient had routine imaging including CT head given mental status changes and chest x-ray with no overt findings.  Patient's lab work was equally as unimpressive with minimally elevated glucose but otherwise unremarkable for any acute findings.  Patient was then admitted to the hospital for further evaluation and treatment.   Patient was monitored followed up.  No obvious infection identified CMP has been fairly stable. Mild elevated creatinine peaked to 1.7 and downtrending since Vitamin B12 at 2687, TSH elevated at 82.5 with low free T4 low at 0.54 ( nl 0.61-1.12) FT3 0.9 Underwent esophagogram that showed pevious fundoplication, diverticulum  On 6/11 Husband reports they moved from [place] 4-week ago 1 week before admission patient has been increasingly restless agitated not sleeping and has not been eating which prompted to the ED visit as husband was unable to take care of her.  In [place]  patient was displaying short-term memory loss with frequent forgetting asking questions about was dressing herself able to feed herself going out for dinner and ambulating.   Subjective: Arouses minimally-quite sleepy  Assessment & Plan:  Advanced dementia with behavioral issues/agitation: Per husband patient had short-term memory issues but was able to feed herself prior to moving from [place] 4 weeks ago and for 1 week prior to admission patient has been extremely agitated not sleeping.   ? Delirium /triggered her decline could be changing her location versus worsening of her dementia versus precipitated by her hypothyroidism due to noncompliance.  Dr. Amada Jupiter from neurology who advised MRI and getting MRI today, with low-dose IV Ativan for claustrophobia.  Neurology has requested to call them back after obtaining MRI and EEG results. Initially   combative and agitated needing security at bedside, at times restrained.  CT head no acute finding LFTs stable no obvious infection, afebrile UA showed ketones 20+ WBC 0-5.  Chest x-ray with bibasilar opacities likely atelectasis, ammonia level normal.  Seen by psychiatry 6/10 medication adjustments made-extremely somnolent now-reconsulting 6/14  Dehydration/ketosis the urine:from poor oral intake.  Encourage oral hydration. 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 Husband did not wish to pursue on further invasive work-up for this.    Continue dysphagia diet, PPI, diet as tolerated.  Feeding assistance as needed.   History of gaseous distention/dyspepsia continue Protonix, simethicone and MiraLAX.  Husband reports patient not having bowel movement for a week.  Can try Dulcolax suppository if needed  ?Syncopal event: Difficult to ascertain question orthostatics given patient's  poor intake  Mild AKI with poor oral intake.  Continue LR 75 cc/H Creatinine  stabilizing  Hypertension: On nifedipine lisinopril and Coreg at home-holding lisinopril and nifedipine.  Continue Coreg with holding parameters.    Hypothyroidism TSH elevated  free T4 on lower side likely from noncompliance.TSH elevated at 82.5 with low free T4 low at 0.54.   Repeat TSH 44 Supplementing with 50 mcg IV daily for 2 doses in addition to 112 orally to see if mentatio n improves as she could have apathetic hypothyroidism which can present like delirium  Eye irritation:continue  eye drop.  DVT prophylaxis:lovenox Code Status: FULL Family Communication: plan of care discussed with patient's husband at bedside 6/11.  Status is: Inpatient Remains inpatient appropriate because:Unsafe d/c plan and Ongoing confusion/dementia with agitation  Dispo: Patient From: Home  Planned Disposition: To be determined [facility] has refused.  PT OT has advised skilled nursing facility  Expected discharge date: 2-3 days  Medically stable for discharge: No  Diet Order            DIET - DYS 1 Room service appropriate? Yes; Fluid consistency: Thin  Diet effective now                 Body mass index is 30.55 kg/m. Consultants: Psychiatry, neurology Procedures:see note Microbiology:see note  Medications: Scheduled Meds: . carvedilol  3.125 mg Oral BID WC  . divalproex  500 mg Oral Q12H  . enoxaparin (LOVENOX) injection  40 mg Subcutaneous Q24H  . escitalopram  20 mg Oral Daily  . levothyroxine  112 mcg Oral QAC breakfast  . levothyroxine  50 mcg Intravenous Daily  . OLANZapine  2.5 mg Oral Daily  . OLANZapine  5 mg Oral QHS  . pantoprazole  40 mg Oral Daily  . polyethylene glycol  17 g Oral Daily  . polyvinyl alcohol  1 drop Both Eyes BID  . simethicone  80 mg Oral QID   Continuous Infusions: . lactated ringers 75 mL/hr at 04/25/20 1228   Antimicrobials: Anti-infectives (From admission, onward)   None    Objective: Vitals: Today's Vitals   04/24/20 1306 04/24/20 2014  04/25/20 0526 04/25/20 0800  BP: 123/68 123/90 (!) 154/93 (!) 156/64  Pulse: 63 (!) 59 60 (!) 50  Resp: 20 16 14    Temp: 98.4 F (36.9 C) 98.2 F (36.8 C) (!) 97.5 F (36.4 C)   TempSrc: Oral Oral Oral   SpO2: 93% 94% 94%   Weight:      Height:      PainSc:    2     Intake/Output Summary (Last 24 hours) at 04/25/2020 1558 Last data filed at 04/25/2020 04/27/2020 Gross per 24 hour  Intake 360 ml  Output --  Net 360 ml   Filed Weights   04/16/20 0116 04/19/20 1255  Weight: 82.1 kg 83.3 kg   Weight change:    Intake/Output from previous day: 06/13 0701 - 06/14 0700 In: 120 [P.O.:120] Out: -  Intake/Output this shift: Total I/O In: 240 [P.O.:240] Out: -   Examination: Somnolent in no distress Mumbling but unable to give full answers Chest clear no added sound no rales no rhonchi Abdomen soft no rebound no guarding Neurologically intact no focal deficit however difficult to examine No lower extremity edema    Data Reviewed: I have personally reviewed following labs and imaging studies CBC: Recent Labs  Lab 04/19/20 0358 04/20/20 0559 04/21/20 0559 04/22/20 0539  WBC 8.1 10.3 7.1 6.6  HGB  15.6* 14.8 12.9 13.1  HCT 47.3* 45.0 39.3 40.1  MCV 96.3 96.4 96.8 96.9  PLT 303 311 241 389   Basic Metabolic Panel: Recent Labs  Lab 04/19/20 0358 04/19/20 0358 04/20/20 0559 04/20/20 0559 04/21/20 0559 04/22/20 0539 04/23/20 0512 04/24/20 0546 04/25/20 0531  NA 135   < > 133*   < > 137 139 138 140 138  K 4.5   < > 4.2   < > 4.3 4.3 4.4 4.7 4.5  CL 101   < > 100   < > 105 102 102 103 103  CO2 22   < > 23   < > 23 26 27 27 26   GLUCOSE 104*   < > 113*   < > 103* 106* 96 97 94  BUN 19   < > 21   < > 21 18 16 23 23   CREATININE 1.08*   < > 1.77*   < > 1.58* 1.38* 1.16* 1.41* 1.31*  CALCIUM 9.9   < > 10.1   < > 10.0 10.0 10.0 9.9 9.2  MG 2.0  --  1.9  --  2.0 1.9  --   --   --   PHOS 2.7  --   --   --  3.2  --   --   --   --    < > = values in this interval not  displayed.   GFR: Estimated Creatinine Clearance: 38.3 mL/min (A) (by C-G formula based on SCr of 1.31 mg/dL (H)). Liver Function Tests: Recent Labs  Lab 04/19/20 0358 04/23/20 0512  AST 23 36  ALT 18 37  ALKPHOS 51 56  BILITOT 0.7 0.5  PROT 6.4* 6.4*  ALBUMIN 3.5 3.4*   No results for input(s): LIPASE, AMYLASE in the last 168 hours. Recent Labs  Lab 04/23/20 0811  AMMONIA 14   Coagulation Profile: No results for input(s): INR, PROTIME in the last 168 hours. Cardiac Enzymes: No results for input(s): CKTOTAL, CKMB, CKMBINDEX, TROPONINI in the last 168 hours. BNP (last 3 results) No results for input(s): PROBNP in the last 8760 hours. HbA1C: No results for input(s): HGBA1C in the last 72 hours. CBG: No results for input(s): GLUCAP in the last 168 hours. Lipid Profile: No results for input(s): CHOL, HDL, LDLCALC, TRIG, CHOLHDL, LDLDIRECT in the last 72 hours. Thyroid Function Tests: Recent Labs    04/25/20 0531  TSH 44.683*   Anemia Panel: No results for input(s): VITAMINB12, FOLATE, FERRITIN, TIBC, IRON, RETICCTPCT in the last 72 hours. Sepsis Labs: No results for input(s): PROCALCITON, LATICACIDVEN in the last 168 hours.  No results found for this or any previous visit (from the past 240 hour(s)).    Radiology Studies: MR BRAIN WO CONTRAST  Result Date: 04/24/2020 CLINICAL DATA:  Encephalopathy.  Dementia EXAM: MRI HEAD WITHOUT CONTRAST TECHNIQUE: Multiplanar, multiecho pulse sequences of the brain and surrounding structures were obtained without intravenous contrast. COMPARISON:  Head CT from 9 days ago FINDINGS: Despite premedication only diffusion imaging could be acquired, and this sequence is motion degraded. Negative for infarct, hydrocephalus, or midline shift. IMPRESSION: 1. Only motion diffusion imaging could be acquired due to patient condition. 2. No acute infarct. Electronically Signed   By: Monte Fantasia M.D.   On: 04/24/2020 12:25   EEG  adult  Result Date: 04/25/2020 Lora Havens, MD     04/25/2020  2:59 PM Patient Name: Annajulia Lewing MRN: [id number] Epilepsy Attending: Lora Havens Referring Physician/Provider: Dr. Antonieta Pert  Date: 04/25/2020 Duration: 25.26 minutes Patient history: 79 year old female with advanced dementia presented with altered mental status.  EEG evaluate for seizures. Level of alertness: Awake AEDs during EEG study: Depakote Technical aspects: This EEG study was done with scalp electrodes positioned according to the 10-20 International system of electrode placement. Electrical activity was acquired at a sampling rate of 500Hz  and reviewed with a high frequency filter of 70Hz  and a low frequency filter of 1Hz . EEG data were recorded continuously and digitally stored. Description: No posterior dominant rhythm was seen.  EEG showed continuous generalized 3 to 5 Hz theta-delta slowing.  Hyperventilation and photic stimulation were not performed.   Of note, EEG was technically difficult due to significant movement artifact as patient was unable to cooperate. ABNORMALITY -Continuous slow, generalized IMPRESSION: This technically difficult study is suggestive of moderate diffuse encephalopathy, nonspecific. No seizures or epileptiform discharges were seen throughout the recording. Priyanka     LOS: 10 days   , MD [facility]  20 min  04/25/2020, 3:58 PM

## 2020-04-25 NOTE — Progress Notes (Signed)
Physical Therapy Treatment Patient Details Name: Kristin Grimes MRN: [id number] DOB: 1941/05/17 Today's Date: 04/25/2020    History of Present Illness Pt is 79 yo female admitted with AMS, FTT. Family brought pt to ED-husband unable to care for pt due to dementia with behavioral disturbances.  Pt currently being managed with medications and has had varied behaviors.Marland Kitchen Hx of dementia, scoliosis, esophageal hernia.    PT Comments    Pt was not agitated during the session. She allowed therapy to work with her but she was minimally participatory. She kept her eyes closed for entire session despite cues to open them- "I can't".  Total +2 for  mobility this session. Pt sat EOB for ~3-4 minutes with varying assistance (Max assist-Min guard assist with posterior lean). No family present during session. Mobility and ability to participate meaningfully has steadily declined since eval on  6/5.    Follow Up Recommendations  SNF vs Supervision/Assistance - 24 hour     Equipment Recommendations  None recommended by PT    Recommendations for Other Services       Precautions / Restrictions Precautions Precautions: Fall Precaution Comments: level of cooperation/agitation varies Restrictions Weight Bearing Restrictions: No    Mobility  Bed Mobility Overal bed mobility: Needs Assistance Bed Mobility: Supine to Sit;Sit to Supine     Supine to sit: Total assist;+2 for physical assistance;+2 for safety/equipment Sit to supine: Total assist;+2 for physical assistance;+2 for safety/equipment   General bed mobility comments: Pt did not initiate/follow any commands on today. Assist for trunk and bil LEs. Internally distracted. Eyes remained closed despite cues to open them- "I can't".  Transfers                 General transfer comment: NT-unable to safely attempt. Pt leaning heavily in posterior direction. Resistant to anterior weigthshift at times.  Ambulation/Gait                  Stairs             Wheelchair Mobility    Modified Rankin (Stroke Patients Only)       Balance Overall balance assessment: Needs assistance Sitting-balance support: Bilateral upper extremity supported;Feet supported Sitting balance-Leahy Scale: Poor   Postural control: Posterior lean                                  Cognition Arousal/Alertness: Lethargic Behavior During Therapy:  (internally distracted/keeps eyes closed) Overall Cognitive Status: History of cognitive impairments - at baseline                                        Exercises      General Comments        Pertinent Vitals/Pain Pain Assessment: Faces Faces Pain Scale: No hurt    Home Living                      Prior Function            PT Goals (current goals can now be found in the care plan section) Progress towards PT goals: Not progressing toward goals - comment    Frequency    Min 2X/week      PT Plan Current plan remains appropriate    Co-evaluation  AM-PAC PT "6 Clicks" Mobility   Outcome Measure  Help needed turning from your back to your side while in a flat bed without using bedrails?: Total Help needed moving from lying on your back to sitting on the side of a flat bed without using bedrails?: Total Help needed moving to and from a bed to a chair (including a wheelchair)?: Total Help needed standing up from a chair using your arms (e.g., wheelchair or bedside chair)?: Total Help needed to walk in hospital room?: Total Help needed climbing 3-5 steps with a railing? : Total 6 Click Score: 6    End of Session   Activity Tolerance:  (limited by cognition) Patient left: in bed;with bed alarm set;with call bell/phone within reach   PT Visit Diagnosis: Muscle weakness (generalized) (M62.81);Adult, failure to thrive (R62.7);Difficulty in walking, not elsewhere classified (R26.2)     Time: 9794-8016 PT Time  Calculation (min) (ACUTE ONLY): 10 min  Charges:  $Therapeutic Activity: 8-22 mins                        Doreatha Massed, PT Acute Rehabilitation  Office: [phone] Pager: [phone]

## 2020-04-25 NOTE — Evaluation (Signed)
MRI was limited due to patient movement. Only the DWI pulse sequence could be obtained. Images were negative for infarct, hydrocephalus, or midline shift.   EEG showed nonspecific diffuse slowing. No electrographic seizures seen.   Labs revealed markedly elevated TSH and low free T4. Per notes, she most likely was noncompliant with her synthroid at home. She is currently being treated with Synthoid; TSH is trending downwards.   Electronically signed: Dr. Caryl Pina

## 2020-04-25 NOTE — Progress Notes (Signed)
Daily Progress Note   Patient Name: Kristin Grimes       Date: 04/25/2020 DOB: 07-01-1941  Age: 79 y.o. MRN#: [id number] Attending Physician: Rhetta Mura, MD Primary Care Physician: Patient, No Pcp Per Admit Date: 04/15/2020  Reason for Consultation/Follow-up:  To discuss complex medical decision making related to patient's goals of care  EEG results - diffuse encephalopathy.  MR brain - degraded exam due to movement.  No stroke or midline shift.  Subjective: Patient responds to my greeting with "Hello".  I complimented her hair and she replied "thank you".  I put [employer] on [employer].  She said, "that's boring, I like jazz"   I put on Jazz and asked if she liked it.  She replied "that's better".  Her lunch tray was emptied.  RN and Tech both report she eats well with feeding assistance.  Discussed with RN patient has not had a BM in a long while.  Will give 1 dose of bisacodyl today.  Spoke with daughter Misty Stanley) and provided update.  Mentioned EEG results, responsiveness and PO intake.  Misty Stanley mentioned her mother has severe pain due to scoliosis and takes tylenol for it at home.   Will trial 24 hours of scheduled tylenol to see pain reduction may improve her function/cognition.   Assessment: 89 yof with 15+ yr hx of dementia.  Presented after what sounds like a psychotic break.  Now is no longer combative.  Eating well with assistance.  Answering some questions appropriately.   Patient Profile/HPI:  79 y.o. female  with past medical history of severe scoliosis, CAD s/p stenting, severe reflux s/p nissan fundiplication, rheumatoid arthritis, and dementia who was admitted on 04/15/2020 with acute altered mental status changes.  She has been seen by Psychiatry inpatient who  recommended Zyprexa bid, haldol as needed and inpatient psychiatric admission.  Neurology has recommended MRI and EEG given acute changes.  These tests were pending at the time of consultation.    Length of Stay: 10   Vital Signs: BP (!) 190/107 (BP Location: Left Arm)   Pulse 60   Temp 98 F (36.7 C) (Oral)   Resp 16   Ht 5\' 5"  (1.651 m)   Wt 83.3 kg   SpO2 98%   BMI 30.55 kg/m  SpO2: SpO2: 98 % O2 Device: O2 Device:  Room Air O2 Flow Rate: O2 Flow Rate (L/min): 0 L/min       Palliative Assessment/Data: 30%     Palliative Care Plan    Recommendations/Plan:  Patient is DNR.  Family pragmatic regarding Halma.  Give 1 dose of bisacodyl for constipation.  Is on miralax as well.  Schedule 650 mg tylenol q 8 hours for a day or two to see if it improves function.  Patient has severe back pain from scoliosis  Olanzepine was 5 mg bid until today.  Am dose was reduced to 2.5 mg.  Patient is more responsive.  Depokote dose remains at 500 mg bid.   These are both new medications to the patient.  Patient no longer combative.  Responds appropriate to some questions.  Patient eating well with feeding assistance.  Family hopeful for continued improvement and SNF placement with Palliative to follow.  Code Status:  DNR  Prognosis:   Unable to determine   Discharge Planning:  To Be Determined.  Hopeful for SNF with Palliative to follow.  Care plan was discussed with Attending MD, TOC, daughter Lattie Haw.  Thank you for allowing the Palliative Medicine Team to assist in the care of this patient.  Total time spent:  45 min.     Greater than 50%  of this time was spent counseling and coordinating care related to the above assessment and plan.  Florentina Jenny, PA-C Palliative Medicine  Please contact Palliative MedicineTeam phone at [phone] for questions and concerns between 7 am - 7 pm.   Please see AMION for individual provider pager numbers.

## 2020-04-25 NOTE — Procedures (Addendum)
Patient Name: Kristin Grimes  MRN: [id number]  Epilepsy Attending: Charlsie Quest  Referring Physician/Provider: Dr. Lanae Boast Date: 04/25/2020 Duration: 25.26 minutes  Patient history: 79 year old female with advanced dementia presented with altered mental status.  EEG evaluate for seizures.  Level of alertness: Awake  AEDs during EEG study: Depakote  Technical aspects: This EEG study was done with scalp electrodes positioned according to the 10-20 International system of electrode placement. Electrical activity was acquired at a sampling rate of 500Hz  and reviewed with a high frequency filter of 70Hz  and a low frequency filter of 1Hz . EEG data were recorded continuously and digitally stored.   Description: No posterior dominant rhythm was seen.  EEG showed continuous generalized 3 to 5 Hz theta-delta slowing.  Hyperventilation and photic stimulation were not performed.     Of note, EEG was technically difficult due to significant movement artifact as patient was unable to cooperate.  ABNORMALITY -Continuous slow, generalized  IMPRESSION: This technically difficult study is suggestive of moderate diffuse encephalopathy, nonspecific. No seizures or epileptiform discharges were seen throughout the recording.  Robley Matassa 

## 2020-04-25 NOTE — Progress Notes (Signed)
EEG completed, results pending. 

## 2020-04-25 NOTE — Plan of Care (Signed)
Pt mildly hypertensive this am.  Pulse 50.  No complaints at this time.   Problem: Clinical Measurements: Goal: Ability to maintain clinical measurements within normal limits will improve Outcome: Progressing Goal: Will remain free from infection Outcome: Progressing Goal: Diagnostic test results will improve Outcome: Progressing Goal: Respiratory complications will improve Outcome: Progressing Goal: Cardiovascular complication will be avoided Outcome: Progressing   Problem: Elimination: Goal: Will not experience complications related to urinary retention Outcome: Progressing   Problem: Pain Managment: Goal: General experience of comfort will improve Outcome: Progressing   Problem: Safety: Goal: Ability to remain free from injury will improve Outcome: Progressing   Problem: Skin Integrity: Goal: Risk for impaired skin integrity will decrease Outcome: Progressing   Problem: Education: Goal: Knowledge of General Education information will improve Description: Including pain rating scale, medication(s)/side effects and non-pharmacologic comfort measures Outcome: Not Progressing   Problem: Health Behavior/Discharge Planning: Goal: Ability to manage health-related needs will improve Outcome: Not Progressing   Problem: Activity: Goal: Risk for activity intolerance will decrease Outcome: Not Progressing   Problem: Nutrition: Goal: Adequate nutrition will be maintained Outcome: Not Progressing   Problem: Coping: Goal: Level of anxiety will decrease Outcome: Not Progressing   Problem: Elimination: Goal: Will not experience complications related to bowel motility Outcome: Not Progressing

## 2020-04-26 LAB — BASIC METABOLIC PANEL
Anion gap: 8 (ref 5–15)
BUN: 16 mg/dL (ref 8–23)
CO2: 27 mmol/L (ref 22–32)
Calcium: 9.4 mg/dL (ref 8.9–10.3)
Chloride: 102 mmol/L (ref 98–111)
Creatinine, Ser: 1.22 mg/dL — ABNORMAL HIGH (ref 0.44–1.00)
GFR calc Af Amer: 49 mL/min — ABNORMAL LOW (ref >60)
GFR calc non Af Amer: 43 mL/min — ABNORMAL LOW (ref >60)
Glucose, Bld: 96 mg/dL (ref 70–99)
Potassium: 4.5 mmol/L (ref 3.5–5.1)
Sodium: 137 mmol/L (ref 135–145)

## 2020-04-26 MED ORDER — OLANZAPINE 5 MG PO TABS
2.5000 mg | ORAL_TABLET | Freq: Two times a day (BID) | ORAL | Status: DC
  Administered 2020-04-26 – 2020-05-02 (×12): 2.5 mg via ORAL
  Filled 2020-04-26 (×12): qty 1

## 2020-04-26 MED ORDER — LEVOTHYROXINE SODIUM 112 MCG PO TABS
112.0000 ug | ORAL_TABLET | Freq: Every day | ORAL | Status: DC
  Filled 2020-04-26: qty 1

## 2020-04-26 MED ORDER — LEVOTHYROXINE SODIUM 100 MCG/5ML IV SOLN
50.0000 ug | Freq: Every day | INTRAVENOUS | Status: AC
  Administered 2020-04-27 – 2020-04-28 (×2): 50 ug via INTRAVENOUS
  Filled 2020-04-26 (×2): qty 5

## 2020-04-26 NOTE — TOC Progression Note (Addendum)
Transition of Care [facility]) - Progression Note    Patient Details  Name: Roselie Cirigliano MRN: [id number] Date of Birth: [birth year]  Transition of [facility] CM/SW Contact  Sterling Ucci, Meriam Sprague, RN Phone Number: 04/26/2020, 12:41 PM  Clinical Narrative:    TOC continues to follow along. Unsure of disposition plan at this time. Will need Psych to re-evaluate pt to see if recommendation is still for Geri Psych. Pt continues to be non-combative at this time and has been for multiple days. Very difficult disposition. Family continues to state that pt can't come home with husband, because he can't handle her anymore. However,  her behavior has improved since husband brought her into the hospital. Husband does have support from son and daughter, and backup plan for care after dc has not yet been developed by the family. It has been expressed multiple times to the daughter Misty Stanley (daughter) that pt needs custodial level care at this time. She eats when she is fed and has worked somewhat with physical therapy. TOC will continue to assist with dc planning.   Expected Discharge Plan: Skilled Nursing Facility Barriers to Discharge: SNF Pending bed offer  Expected Discharge Plan and Services Expected Discharge Plan: Skilled Nursing Facility In-house Referral: Clinical Social Work Discharge Planning Services: CM Consult Post Acute Care Choice: Skilled Nursing Facility Living arrangements for the past 2 months: Single Family Home                                       Social Determinants of Health (SDOH) Interventions    Readmission Risk Interventions Readmission Risk Prevention Plan 04/18/2020  Post Dischage Appt Complete  Medication Screening Complete  Transportation Screening Complete

## 2020-04-26 NOTE — Consult Note (Addendum)
[facility] Face-to-Face Psychiatry Consult   Reason for Consult:  Agitation, dementia Referring Physician:  Dr Tyson Babinski Patient Identification: Kristin Grimes MRN:  [id number] Principal Diagnosis: Advanced dementia [facility]) Diagnosis:  Principal Problem:   Advanced dementia (HCC) Active Problems:   Altered mental status   Failure to thrive in adult   Dysphagia   Dementia with behavioral disturbance (HCC)   Esophageal hernia   Palliative care encounter   DNR (do not resuscitate)   Total Time spent with patient: 1 hour  Subjective:   Kristin Grimes is a 79 y.o. female patient admitted with dysphagia.  Patient seen and evaluated by this practitioner.  Patient was resting upon entering the room and safety sitter was at the bedside.  Patient would not open her eyes to talk however remained alert.  When writer asked how patient was doing she said I could be better.  Patient has a history of dementia with behavioral disturbances, however it appears to have been improved with medication.  She was previously needing stabilization and geriatric psychiatric hospitalization.  Psychiatry was reconsulted due to patient being too somnolent.  She is currently taking Zyprexa 2.5 p.o. every morning, Zyprexa 5 mg p.o. nightly, Lexapro 20 mg, Depakote delayed release 500 mg every 12, and Zyprexa 10 mg IM every 8 as needed for agitation (last dose received June 10).  Patient has not received Depakote level since admission.  HPI per MD:   Kristin Grimes is a 79 y.o. female with unknown medical history other than advanced dementia, questionable esophageal surgery unspecified.  Patient recently moved from [place] with husband with known advanced dementia with worsening mental status over the past 4 weeks since the move.  Family is concerned that this is a psychotic issue related to her dementia worsening acutely and is now refusing to take p.o.  Patient's family is also concerned that her refusal of p.o.  may be related in someway to her distant history of esophageal surgery.  There is discussion about possible syncopal event over the past few weeks but patient was brought to the ED acutely today due to worsening mental status and combative nature with family at home.    Past Psychiatric History: dementia, depression  Risk to Self:  No Risk to Others:  No Prior Inpatient Therapy:  Unknown Prior Outpatient Therapy:  Unknown  Past Medical History:  Past Medical History:  Diagnosis Date  . Hypothyroidism    History reviewed. No pertinent surgical history. Family History: History reviewed. No pertinent family history. Family Psychiatric  History: Unknown Social History:  Social History   Substance and Sexual Activity  Alcohol Use Yes   Comment: occasional     Social History   Substance and Sexual Activity  Drug Use Never    Social History   Socioeconomic History  . Marital status: Married    Spouse name: Not on file  . Number of children: Not on file  . Years of education: Not on file  . Highest education level: Not on file  Occupational History  . Not on file  Tobacco Use  . Smoking status: Former Smoker    Types: Cigarettes  . Smokeless tobacco: Never Used  Vaping Use  . Vaping Use: Never used  Substance and Sexual Activity  . Alcohol use: Yes    Comment: occasional  . Drug use: Never  . Sexual activity: Not on file  Other Topics Concern  . Not on file  Social History Narrative  . Not on file  Social Determinants of Health   Financial Resource Strain:   . Difficulty of Paying Living Expenses:   Food Insecurity:   . Worried About Programme researcher, broadcasting/film/video in the Last Year:   . Barista in the Last Year:   Transportation Needs:   . Freight forwarder (Medical):   Marland Kitchen Lack of Transportation (Non-Medical):   Physical Activity:   . Days of Exercise per Week:   . Minutes of Exercise per Session:   Stress:   . Feeling of Stress :   Social Connections:    . Frequency of Communication with Friends and Family:   . Frequency of Social Gatherings with Friends and Family:   . Attends Religious Services:   . Active Member of Clubs or Organizations:   . Attends Banker Meetings:   Marland Kitchen Marital Status:    Additional Social History:    Allergies:  No Known Allergies  Labs:  Results for orders placed or performed during the hospital encounter of 04/15/20 (from the past 48 hour(s))  Basic metabolic panel     Status: Abnormal   Collection Time: 04/25/20  5:31 AM  Result Value Ref Range   Sodium 138 135 - 145 mmol/L   Potassium 4.5 3.5 - 5.1 mmol/L   Chloride 103 98 - 111 mmol/L   CO2 26 22 - 32 mmol/L   Glucose, Bld 94 70 - 99 mg/dL    Comment: Glucose reference range applies only to samples taken after fasting for at least 8 hours.   BUN 23 8 - 23 mg/dL   Creatinine, Ser 1.75 (H) 0.44 - 1.00 mg/dL   Calcium 9.2 8.9 - 10.2 mg/dL   GFR calc non Af Amer 39 (L) >60 mL/min   GFR calc Af Amer 45 (L) >60 mL/min   Anion gap 9 5 - 15    Comment: Performed at [facility], [address], [place], [place] [address]  TSH     Status: Abnormal   Collection Time: 04/25/20  5:31 AM  Result Value Ref Range   TSH 44.683 (H) 0.350 - 4.500 uIU/mL    Comment: Performed by a 3rd Generation assay with a functional sensitivity of <=0.01 uIU/mL. Performed at [facility], [address], [place], [place] [address]   Basic metabolic panel     Status: Abnormal   Collection Time: 04/26/20  5:52 AM  Result Value Ref Range   Sodium 137 135 - 145 mmol/L   Potassium 4.5 3.5 - 5.1 mmol/L   Chloride 102 98 - 111 mmol/L   CO2 27 22 - 32 mmol/L   Glucose, Bld 96 70 - 99 mg/dL    Comment: Glucose reference range applies only to samples taken after fasting for at least 8 hours.   BUN 16 8 - 23 mg/dL   Creatinine, Ser 3.53 (H) 0.44 - 1.00 mg/dL   Calcium 9.4 8.9 - 61.4 mg/dL   GFR calc non Af Amer 43 (L) >60 mL/min    GFR calc Af Amer 49 (L) >60 mL/min   Anion gap 8 5 - 15    Comment: Performed at [facility], [address], [place], [place] [address]    Current Facility-Administered Medications  Medication Dose Route Frequency Provider Last Rate Last Admin  . acetaminophen (TYLENOL) tablet 650 mg  650 mg Oral Q6H PRN Eduard Clos, MD   650 mg at 04/19/20 2305  . acetaminophen (TYLENOL) tablet 650 mg  650 mg Oral Q8H Dellinger, Marianne L, PA-C   650 mg at 04/26/20 1345  . bisacodyl (DULCOLAX) suppository 10 mg  10 mg Rectal Daily PRN Kc, Dayna Barker, MD      . carvedilol (COREG) tablet 3.125 mg  3.125 mg Oral BID WC Pokhrel, Laxman, MD   3.125 mg at 04/25/20 1640  . divalproex (DEPAKOTE) DR tablet 500 mg  500 mg Oral Q12H Kc, Ramesh, MD   500 mg at 04/26/20 1037  . enoxaparin (LOVENOX) injection 40 mg  40 mg Subcutaneous Q24H Earl Many M, RPH   40 mg at 04/26/20 1038  . escitalopram (LEXAPRO) tablet 20 mg  20 mg Oral Daily Pokhrel, Laxman, MD   20 mg at 04/26/20 1038  . lactated ringers infusion   Intravenous Continuous Lanae Boast, MD 75 mL/hr at 04/25/20 1228 New Bag at 04/25/20 1228  . [START ON 04/29/2020] levothyroxine (SYNTHROID) tablet 112 mcg  112 mcg Oral QAC breakfast Rhetta Mura, MD      . Melene Muller ON 04/27/2020] levothyroxine (SYNTHROID, LEVOTHROID) injection 50 mcg  50 mcg Intravenous Daily Samtani, Jai-Gurmukh, MD      . OLANZapine (ZYPREXA) injection 10 mg  10 mg Intramuscular Q8H PRN Pokhrel, Laxman, MD   10 mg at 04/21/20 1554  . OLANZapine (ZYPREXA) tablet 2.5 mg  2.5 mg Oral Daily Dellinger, Marianne L, PA-C   2.5 mg at 04/26/20 1038  . OLANZapine (ZYPREXA) tablet 5 mg  5 mg Oral QHS Dellinger, Marianne L, PA-C   5 mg at 04/25/20 2119  . ondansetron (ZOFRAN-ODT) disintegrating tablet 4 mg  4 mg Oral Q8H PRN Pokhrel, Laxman, MD   4 mg at 04/19/20 2305  . pantoprazole (PROTONIX) EC tablet 40 mg  40 mg Oral Daily Pokhrel, Laxman, MD   40 mg at 04/26/20 1038  .  polyethylene glycol (MIRALAX / GLYCOLAX) packet 17 g  17 g Oral Daily Pokhrel, Laxman, MD   17 g at 04/26/20 1346  . polyvinyl alcohol (LIQUIFILM TEARS) 1.4 % ophthalmic solution 1 drop  1 drop Both Eyes PRN Pokhrel, Laxman, MD      . polyvinyl alcohol (LIQUIFILM TEARS) 1.4 % ophthalmic solution 1 drop  1 drop Both Eyes BID Kc, Ramesh, MD   1 drop at 04/26/20 1038  . simethicone (MYLICON) chewable tablet 80 mg  80 mg Oral QID Pokhrel, Laxman, MD   80 mg at 04/26/20 1345    Musculoskeletal: Strength & Muscle Tone: increased Gait & Station: did not witness Patient leans: N/A  Psychiatric Specialty Exam: Physical Exam  Nursing note and vitals reviewed. Constitutional: She appears well-developed.  HENT:  Head: Normocephalic.  Cardiovascular: Normal rate.  Respiratory: Effort normal.  Neurological: She is alert.  Psychiatric: Her speech is normal. Her affect is angry. She is agitated, aggressive and combative. Thought content is paranoid. She expresses impulsivity and inappropriate judgment. She is inattentive.    Review of Systems  Psychiatric/Behavioral: Positive for behavioral problems and confusion. The patient is nervous/anxious.   All other systems reviewed and are negative.   Blood pressure (!) 145/108, pulse (!) 55, temperature 97.7 F (36.5 C), temperature source Oral, resp. rate 18, height 5\' 5"  (1.651 m), weight 83.3 kg, SpO2 95 %.Body mass index is 30.55 kg/m.  General Appearance: Disheveled  Eye Contact:  Poor  Speech:  Clear and Coherent and Slow  Volume:  Normal  Mood:  Depressed and Irritable  Affect:  Appropriate  Thought Process:  Linear  Orientation:  Other:  person  Thought  Content:  rambling  Suicidal Thoughts:  No self harm behaviors  Homicidal Thoughts:  No  Memory:  Immediate;   Poor Recent;   Poor  Judgement:  Impaired  Insight:  Lacking  Psychomotor Activity:  Decreased and Restlessness  Concentration:  Concentration: Poor and Attention Span: Poor   Recall:  unable to assess  Fund of Knowledge:  unable to assess  Language:  Good  Akathisia:  No  Handed:  Right  AIMS (if indicated):     Assets:  Leisure Time Resilience Social Support  ADL's:  Impaired  Cognition:  Impaired,  Moderate  Sleep:        Treatment Plan Summary: Dementia with behavioral disturbance: Behavioral disturbances have improved however patient too somnolent and sedated due to medications.  We will make appropriate adjustments as follows.  Will continue with Depakote 500 mg p.o. twice daily.  Will obtain Depakote level tomorrow morning, please follow-up as no level has been obtained prior.  Will decrease Zyprexa to 2.5 mg p.o. twice daily. Will obtain EKG at this time, last EKG one week ago.  We will continue home medication of Lexapro 20 mg.  Patient with current hypothyroidism will continue to monitor.  Disposition: Recommend psychiatric Inpatient admission when medically cleared.  Suella Broad, FNP 04/26/2020 3:20 PM

## 2020-04-26 NOTE — Plan of Care (Signed)
Pt DBP elevated, pulse 55 this am.  Resting comfortably at this time.   Problem: Clinical Measurements: Goal: Ability to maintain clinical measurements within normal limits will improve Outcome: Progressing Goal: Will remain free from infection Outcome: Progressing Goal: Diagnostic test results will improve Outcome: Progressing Goal: Respiratory complications will improve Outcome: Progressing Goal: Cardiovascular complication will be avoided Outcome: Progressing   Problem: Coping: Goal: Level of anxiety will decrease Outcome: Progressing   Problem: Elimination: Goal: Will not experience complications related to urinary retention Outcome: Progressing   Problem: Pain Managment: Goal: General experience of comfort will improve Outcome: Progressing   Problem: Safety: Goal: Ability to remain free from injury will improve Outcome: Progressing   Problem: Skin Integrity: Goal: Risk for impaired skin integrity will decrease Outcome: Progressing   Problem: Education: Goal: Knowledge of General Education information will improve Description: Including pain rating scale, medication(s)/side effects and non-pharmacologic comfort measures Outcome: Not Progressing   Problem: Health Behavior/Discharge Planning: Goal: Ability to manage health-related needs will improve Outcome: Not Progressing   Problem: Activity: Goal: Risk for activity intolerance will decrease Outcome: Not Progressing   Problem: Nutrition: Goal: Adequate nutrition will be maintained Outcome: Not Progressing   Problem: Elimination: Goal: Will not experience complications related to bowel motility Outcome: Not Progressing

## 2020-04-26 NOTE — Progress Notes (Signed)
Occupational Therapy Treatment Patient Details Name: Kristin Grimes MRN: [id number] DOB: 04-23-1941 Today's Date: 04/26/2020    History of present illness Pt is 79 yo female admitted with AMS, FTT. Family brought pt to ED-husband unable to care for pt due to dementia with behavioral disturbances.  Pt currently being managed with medications and has had varied behaviors.Marland Kitchen Hx of dementia, scoliosis, esophageal hernia.   OT comments  Kristin Grimes has had a prolonged hospitalization secondary to waxing and waning mental status and alertness as well as combative behaviors.  Currently patient is demonstrating more impaired mobility and participation in ADLs compared to her OT evaluation on 6/5. On evaluation patient able to feed herself, to ambulate to bathroom without hand held assist and perform toileting and able to follow some commands given increased time. Today, patient requiring max assist for supine to sit, unable to stand, impaired sitting balance, tremors of upper extremities, and following minimal commands. Patient unable to feed herself. Patient will not open her eyes during entire treatment despite multimodal cues and presence of husband. Patient's goals downgraded due to decreased level of abilities. Patient continues to need skilled OT services to improve patient's functional abilities in order to reduce caregiver burden.    Follow Up Recommendations  SNF    Equipment Recommendations  None recommended by OT    Recommendations for Other Services      Precautions / Restrictions Precautions Precautions: Fall Precaution Comments: level of cooperation/agitation varies Restrictions Weight Bearing Restrictions: No       Mobility Bed Mobility Overal bed mobility: Needs Assistance Bed Mobility: Supine to Sit;Sit to Supine     Supine to sit: Max assist Sit to supine: +2 for physical assistance;Mod assist   General bed mobility comments: Patient max assist to  transfer to side of bed - needing assistance to initiate both lower extremities and trunk negotiation. At end of treatment - required assisatnce of two to perform due to not following commands.  Transfers                 General transfer comment: Unable to stand at this time due to impaired balance and patient not following commands.    Balance Overall balance assessment: Needs assistance Sitting-balance support: Feet supported;Single extremity supported Sitting balance-Leahy Scale: Poor Sitting balance - Comments: Pt initially leaning posteriorly.  Provided multimodal cues as well as reaching task to encourage forward lean.  Sat EOB for 18 mins working on balance, sitting tolerance. Postural control: Posterior lean;Right lateral lean                                 ADL either performed or assessed with clinical judgement   ADL       Grooming: Oral care;Wash/dry face;Set up;Sitting;Cueing for sequencing Grooming Details (indicate cue type and reason): Patient transferred to side of bed. Patient provided with warm wash cloth in hand. Patient able to wash face with verbal cues though quality poor.                                     Vision   Additional Comments: Patient did not open her eyes during treatment. Does not report pain, burning or discomfort. No amount of cueing and conversation nor presence of husband made her open her eyes.   Perception     Praxis  Cognition Arousal/Alertness: Awake/alert Behavior During Therapy: Agitated Overall Cognitive Status: History of cognitive impairments - at baseline                                 General Comments: Patient will not open her eyes. Cannot be reasoned with. Became more agitated and verbally aggressive near of treatment.        Exercises     Shoulder Instructions       General Comments      Pertinent Vitals/ Pain       Pain Assessment: No/denies pain  Home  Living                                          Prior Functioning/Environment              Frequency  Min 2X/week        Progress Toward Goals  OT Goals(current goals can now be found in the care plan section)  Progress towards OT goals: Goals drowngraded-see care plan  Acute Rehab OT Goals Patient Stated Goal: improve ability to perform daily tasks OT Goal Formulation: With family Time For Goal Achievement: 05/10/20  Plan Discharge plan remains appropriate    Co-evaluation                 AM-PAC OT "6 Clicks" Daily Activity     Outcome Measure   Help from another person eating meals?: A Lot Help from another person taking care of personal grooming?: A Little Help from another person toileting, which includes using toliet, bedpan, or urinal?: Total Help from another person bathing (including washing, rinsing, drying)?: A Lot Help from another person to put on and taking off regular upper body clothing?: A Lot Help from another person to put on and taking off regular lower body clothing?: Total 6 Click Score: 11    End of Session    OT Visit Diagnosis: Unsteadiness on feet (R26.81)   Activity Tolerance Treatment limited secondary to agitation   Patient Left in bed;with call bell/phone within reach;with bed alarm set;with family/visitor present   Nurse Communication Mobility status (RN in room to assist therapist return patient to bed.)        Time: 1223-1249 OT Time Calculation (min): 26 min  Charges: OT General Charges $OT Visit: 1 Visit OT Treatments $Self Care/Home Management : 8-22 mins $Therapeutic Activity: 8-22 mins  Derl Barrow, OTR/L [place]  Office [phone] Pager: [phone]    Lenward Chancellor 04/26/2020, 1:48 PM

## 2020-04-26 NOTE — Progress Notes (Signed)
PROGRESS NOTE    Kristin Grimes  BDZ:[id number] DOB: 01/31/1941 DOA: 04/15/2020 PCP: Patient, No Pcp Per   Chef Complaints: Agitation  Brief Narrative: 79 y.o.advanced dementia for the past 15 yrs per husband esophageal surgery unspecified moved from [place] with her husband presented to hospital with worsening mental status for the last 4 weeks since the move.   Family was concerned that it was a psychotic issue and the patient has been refusing to take p.o. with prior history of esophageal surgery.   There is some question about the syncopal event over the past few weeks.  Patient was brought into the hospital because of worsening mental status and combativeness with family at home.   In the ED, patient was evaluated. Routine lab work was obtained + markedly combative requiring 2 mg of IV Haldol with minimal to no improvement  given 40 mg of Geodon p.o. with moderate improvement but ultimately decision was made for one-to-one sitter to be at bedside. CT head given mental status changes and chest x-ray with no overt findings.  Patient's lab work was equally as unimpressive with minimally elevated glucose but otherwise unremarkable for any acute findings.  Patient was then admitted to the hospital for further evaluation and treatment.   Patient was monitored followed up.  No obvious infection identified CMP has been fairly stable. Mild elevated creatinine peaked to 1.7 and downtrending since Vitamin B12 at 2687, TSH elevated at 82.5 with low free T4 low at 0.54 ( nl 0.61-1.12) FT3 0.9 Underwent esophagogram that showed pevious fundoplication, diverticulum  On 6/11 Husband reports they moved from [place] 4-week ago 1 week before admission patient has been increasingly restless agitated not sleeping and has not been eating which prompted to the ED visit as husband was unable to take care of her.  In [place] patient was displaying short-term memory loss with frequent forgetting  asking questions about was dressing herself able to feed herself going out for dinner and ambulating.   Subjective: Arouses minimally-quite sleepy She arouses a little better than she does however yesterday she is making more meaningful sounds She still keeps her eyes closed and her husband expresses significant concern about this however intermittently she opens them while talking to me  Assessment & Plan:  Advanced dementia with behavioral issues/agitation: Per husband patient had short-term memory issues but was able to feed herself prior to moving from [place] 4 weeks ago and for 1 week prior to admission patient has been extremely agitated not sleeping.   ? Delirium /triggered her decline could be changing her location versus worsening of her dementia versus precipitated by her hypothyroidism due to noncompliance.  Dr. Amada Jupiter from neurology who advised MRI and getting MRI today, with low-dose IV Ativan for claustrophobia.  Dr. Otelia Limes on 6/14 reviewed the case and felt that with improvements especially with negative MRI EEG this is primarily metabolic Initially   combative and agitated now much calmer .  CT head no acute finding LFTs stable no obvious infection Seen by psychiatry 6/10 medication adjustments made-extremely somnolent now-reconsulting 6/14 and they have seen and adjusted downward some medications Follow Depakote level  Dehydration/ketosis the urine:from poor oral intake.  Encourage oral hydration. 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 Husband did not wish to pursue on further invasive work-up for this.    On dysphagia diet tolerates feeding with assistance  History of gaseous distention/dyspepsia continue Protonix, simethicone and MiraLAX.  Constipated for week per husband-  ?Syncopal event: Difficult to ascertain question orthostatics given patient's poor intake and  improving  Mild AKI with poor oral intake.  Continue LR 75 cc/H Creatinine stabilizing  Hypertension: On nifedipine lisinopril and Coreg at home-holding lisinopril and nifedipine.  Continue Coreg with holding parameters.    Hypothyroidism TSH elevated  free T4 on lower side likely from noncompliance.TSH elevated at 82.5 with low free T4 low at 0.54.   Repeat TSH 44 Supplementing with 50 mcg IV daily for 4 doses in addition to 112 orally  her mentation is steadily improving  Eye irritation:continue  eye drop.  DVT prophylaxis:lovenox Code Status: FULL Family Communication: plan of care discussed with patient's husband at bedside 6/14  14 extensively Mentioned to the husband at the bedside that it is felt that the patient will require "custodial" care as she is not meaningfully participating with therapy and I have also liaised with clinical support transitions of care Arna Medici who has awaited psychiatry input with regards to disposition and it looks like [facility] psych is needed on discharge Expected that this will be a difficult placement situation if she can be placed Care team will continue to work with family with regards to the same  Status is: Inpatient Remains inpatient appropriate because:Unsafe d/c plan and Ongoing confusion/dementia with agitation  Dispo: Patient From: Home  Planned Disposition: To be determined [facility] psych will be needed on discharge per psychiatry saw the patient  Expected discharge date: 2-3 days  Medically stable for discharge: No  Diet Order            DIET - DYS 1 Room service appropriate? Yes; Fluid consistency: Thin  Diet effective now                 Body mass index is 30.55 kg/m. Consultants: Psychiatry, neurology Procedures:see note Microbiology:see note  Medications: Scheduled Meds: . acetaminophen  650 mg Oral Q8H  . carvedilol  3.125 mg Oral BID WC  . divalproex  500 mg Oral Q12H  . enoxaparin (LOVENOX) injection  40 mg Subcutaneous Q24H   . escitalopram  20 mg Oral Daily  . [START ON 04/29/2020] levothyroxine  112 mcg Oral QAC breakfast  . [START ON 04/27/2020] levothyroxine  50 mcg Intravenous Daily  . OLANZapine  2.5 mg Oral BID  . pantoprazole  40 mg Oral Daily  . polyethylene glycol  17 g Oral Daily  . polyvinyl alcohol  1 drop Both Eyes BID  . simethicone  80 mg Oral QID   Continuous Infusions: . lactated ringers 75 mL/hr at 04/25/20 1228   Antimicrobials: Anti-infectives (From admission, onward)   None    Objective: Vitals: Today's Vitals   04/25/20 2100 04/26/20 0548 04/26/20 0730 04/26/20 0815  BP:  (!) 168/82  (!) 145/108  Pulse:  (!) 58  (!) 55  Resp:  18    Temp:  97.7 F (36.5 C)    TempSrc:  Oral    SpO2:  95%    Weight:      Height:      PainSc: 0-No pain  2      Intake/Output Summary (Last 24 hours) at 04/26/2020 1705 Last data filed at 04/26/2020 1529 Gross per 24 hour  Intake 1695 ml  Output 850 ml  Net 845 ml   Filed Weights   04/16/20 0116 04/19/20 1255  Weight: 82.1 kg 83.3 kg   Weight change:    Intake/Output from previous day: 06/14 0701 - 06/15  0700 In: 1815 [P.O.:840; I.V.:975] Out: 850 [Urine:850] Intake/Output this shift: Total I/O In: 720 [P.O.:720] Out: -   Examination: Somnolent in no distress opens eyes temporarily Answering more clearly Chest clear no added sound no rales no rhonchi Abdomen soft no rebound no guarding Neurologically intact no focal deficit seems to slump out of bed is able to participate some better with exam than previously No lower extremity edema    Data Reviewed: I have personally reviewed following labs and imaging studies CBC: Recent Labs  Lab 04/20/20 0559 04/21/20 0559 04/22/20 0539  WBC 10.3 7.1 6.6  HGB 14.8 12.9 13.1  HCT 45.0 39.3 40.1  MCV 96.4 96.8 96.9  PLT 311 241 267   Basic Metabolic Panel: Recent Labs  Lab 04/20/20 0559 04/20/20 0559 04/21/20 0559 04/21/20 0559 04/22/20 0539 04/23/20 0512 04/24/20 0546  2020-04-28 0531 04/26/20 0552  NA 133*   < > 137   < > 139 138 140 138 137  K 4.2   < > 4.3   < > 4.3 4.4 4.7 4.5 4.5  CL 100   < > 105   < > 102 102 103 103 102  CO2 23   < > 23   < > 26 27 27 26 27   GLUCOSE 113*   < > 103*   < > 106* 96 97 94 96  BUN 21   < > 21   < > 18 16 23 23 16   CREATININE 1.77*   < > 1.58*   < > 1.38* 1.16* 1.41* 1.31* 1.22*  CALCIUM 10.1   < > 10.0   < > 10.0 10.0 9.9 9.2 9.4  MG 1.9  --  2.0  --  1.9  --   --   --   --   PHOS  --   --  3.2  --   --   --   --   --   --    < > = values in this interval not displayed.  45 GFR: Estimated Creatinine Clearance: 41.2 mL/min (A) (by C-G formula based on SCr of 1.22 mg/dL (H)). Liver Function Tests: Recent Labs  Lab 04/23/20 0512  AST 36  ALT 37  ALKPHOS 56  BILITOT 0.5  PROT 6.4*  ALBUMIN 3.4*   No results for input(s): LIPASE, AMYLASE in the last 168 hours. Recent Labs  Lab 04/23/20 0811  AMMONIA 14   Coagulation Profile: No results for input(s): INR, PROTIME in the last 168 hours. Cardiac Enzymes: No results for input(s): CKTOTAL, CKMB, CKMBINDEX, TROPONINI in the last 168 hours. BNP (last 3 results) No results for input(s): PROBNP in the last 8760 hours. HbA1C: No results for input(s): HGBA1C in the last 72 hours. CBG: No results for input(s): GLUCAP in the last 168 hours. Lipid Profile: No results for input(s): CHOL, HDL, LDLCALC, TRIG, CHOLHDL, LDLDIRECT in the last 72 hours. Thyroid Function Tests: Recent Labs    04/28/20 0531  TSH 44.683*   Anemia Panel: No results for input(s): VITAMINB12, FOLATE, FERRITIN, TIBC, IRON, RETICCTPCT in the last 72 hours. Sepsis Labs: No results for input(s): PROCALCITON, LATICACIDVEN in the last 168 hours.  No results found for this or any previous visit (from the past 240 hour(s)).    Radiology Studies: EEG adult  Result Date: 04-28-20 04/27/20, MD     04/28/2020  2:59 PM Patient Name: Ithzel Fedorchak MRN: 04/27/2020 Epilepsy Attending:  Lucia Estelle Referring Physician/Provider: Dr. [id number] Date: 2020/04/28 Duration:  25.26 minutes Patient history: 79 year old female with advanced dementia presented with altered mental status.  EEG evaluate for seizures. Level of alertness: Awake AEDs during EEG study: Depakote Technical aspects: This EEG study was done with scalp electrodes positioned according to the 10-20 International system of electrode placement. Electrical activity was acquired at a sampling rate of 500Hz  and reviewed with a high frequency filter of 70Hz  and a low frequency filter of 1Hz . EEG data were recorded continuously and digitally stored. Description: No posterior dominant rhythm was seen.  EEG showed continuous generalized 3 to 5 Hz theta-delta slowing.  Hyperventilation and photic stimulation were not performed.   Of note, EEG was technically difficult due to significant movement artifact as patient was unable to cooperate. ABNORMALITY -Continuous slow, generalized IMPRESSION: This technically difficult study is suggestive of moderate diffuse encephalopathy, nonspecific. No seizures or epileptiform discharges were seen throughout the recording. Lora Havens     LOS: 11 days   Nita Sells, MD [facility]   min  04/26/2020, 5:05 PM

## 2020-04-27 LAB — COMPREHENSIVE METABOLIC PANEL
ALT: 24 U/L (ref 0–44)
AST: 33 U/L (ref 15–41)
Albumin: 3.2 g/dL — ABNORMAL LOW (ref 3.5–5.0)
Alkaline Phosphatase: 59 U/L (ref 38–126)
Anion gap: 9 (ref 5–15)
BUN: 10 mg/dL (ref 8–23)
CO2: 26 mmol/L (ref 22–32)
Calcium: 9.6 mg/dL (ref 8.9–10.3)
Chloride: 103 mmol/L (ref 98–111)
Creatinine, Ser: 1 mg/dL (ref 0.44–1.00)
GFR calc Af Amer: >60 mL/min (ref >60)
GFR calc non Af Amer: 54 mL/min — ABNORMAL LOW (ref >60)
Glucose, Bld: 110 mg/dL — ABNORMAL HIGH (ref 70–99)
Potassium: 4.5 mmol/L (ref 3.5–5.1)
Sodium: 138 mmol/L (ref 135–145)
Total Bilirubin: 0.9 mg/dL (ref 0.3–1.2)
Total Protein: 6.2 g/dL — ABNORMAL LOW (ref 6.5–8.1)

## 2020-04-27 LAB — CBC WITH DIFFERENTIAL/PLATELET
Abs Immature Granulocytes: 0.03 10*3/uL (ref 0.00–0.07)
Basophils Absolute: 0 10*3/uL (ref 0.0–0.1)
Basophils Relative: 0 %
Eosinophils Absolute: 0.2 10*3/uL (ref 0.0–0.5)
Eosinophils Relative: 3 %
HCT: 42.4 % (ref 36.0–46.0)
Hemoglobin: 13.7 g/dL (ref 12.0–15.0)
Immature Granulocytes: 0 %
Lymphocytes Relative: 13 %
Lymphs Abs: 1 10*3/uL (ref 0.7–4.0)
MCH: 31.3 pg (ref 26.0–34.0)
MCHC: 32.3 g/dL (ref 30.0–36.0)
MCV: 96.8 fL (ref 80.0–100.0)
Monocytes Absolute: 1.1 10*3/uL — ABNORMAL HIGH (ref 0.1–1.0)
Monocytes Relative: 14 %
Neutro Abs: 5.6 10*3/uL (ref 1.7–7.7)
Neutrophils Relative %: 70 %
Platelets: 243 10*3/uL (ref 150–400)
RBC: 4.38 MIL/uL (ref 3.87–5.11)
RDW: 13.7 % (ref 11.5–15.5)
WBC: 8 10*3/uL (ref 4.0–10.5)
nRBC: 0 % (ref 0.0–0.2)

## 2020-04-27 LAB — VALPROIC ACID LEVEL: Valproic Acid Lvl: 115 ug/mL — ABNORMAL HIGH (ref 50.0–100.0)

## 2020-04-27 NOTE — Progress Notes (Signed)
PROGRESS NOTE    Ronell Boldin  WUJ:[id number] DOB: 1941/07/30 DOA: 04/15/2020 PCP: Patient, No Pcp Per   Chef Complaints: Agitation  Brief Narrative: 79 y.o.advanced dementia for the past 15 yrs per husband esophageal surgery unspecified moved from [place] with her husband presented to hospital with worsening mental status for the last 4 weeks since the move.   Family was concerned that it was a psychotic issue and the patient has been refusing to take p.o. with prior history of esophageal surgery.   There is some question about the syncopal event over the past few weeks.  Patient was brought into the hospital because of worsening mental status and combativeness with family at home.   In the ED, patient was evaluated. Routine lab work was obtained + markedly combative requiring 2 mg of IV Haldol with minimal to no improvement  given 40 mg of Geodon p.o. with moderate improvement but ultimately decision was made for one-to-one sitter to be at bedside. CT head given mental status changes and chest x-ray with no overt findings.  Patient's lab work was equally as unimpressive with minimally elevated glucose but otherwise unremarkable for any acute findings.  Patient was then admitted to the hospital for further evaluation and treatment.   Patient was monitored followed up.  No obvious infection identified CMP has been fairly stable. Mild elevated creatinine peaked to 1.7 and downtrending since Vitamin B12 at 2687, TSH elevated at 82.5 with low free T4 low at 0.54 ( nl 0.61-1.12) FT3 0.9 Prior esophagogram that showed pevious fundoplication, diverticulum  On 6/11 Husband reports they moved from [place] 4-week ago 1 week before admission patient has been increasingly restless agitated not sleeping and has not been eating which prompted to the ED visit as husband was unable to take care of her.  In [place] patient was displaying short-term memory loss with frequent forgetting  asking questions about was dressing herself able to feed herself going out for dinner and ambulating.   Subjective: More arousable in no distress Trying to get out of bed and has taken off her underwear which she attempts to put back on She opens her eyes no more cohesively and is able to meaningfully make some conversation  Assessment & Plan:  Advanced dementia with behavioral issues/agitation: Per husband patient had short-term memory issues but was able to feed herself prior to moving from [place] 4 weeks ago and for 1 week prior to admission patient has been extremely agitated not sleeping.   ? Delirium /triggered her decline could be changing her location versus apathetic geriatric hypothyroidism causing mental decline Dr. Amada Jupiter consulted-recommended EEG MRI Dr. Otelia Limes on 6/14 reviewed the case and felt that with improvements especially with negative MRI EEG this is primarily metabolic -Patient has calmed down significantly .  CT head no acute finding LFTs stable no obvious infection  consulted psychiatry 6/10 medication adjustments made-somnolence improved reconsulting 6/14 and 6/15  Current meds include Lexapro 20, Zyprexa 2.5 twice  daily-greatly appreciate their input  Dehydration/ketosis the urine:from poor oral intake.  Encourage oral hydration. 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 Husband did not wish to pursue on further invasive work-up for this.    On dysphagia diet tolerates feeding with assistance and needs assistance continuously  History of gaseous distention/dyspepsia continue Protonix, simethicone and MiraLAX.  Constipated for week per husband-  ?Syncopal event: Difficult to ascertain question orthostatics given patient's poor intake and improving  Mild AKI with poor oral intake.  Continue LR 75 cc/H Creatinine stabilizing  Hypertension: On nifedipine lisinopril  and Coreg at home-holding lisinopril and nifedipine.  Continue Coreg with holding parameters.    Hypothyroidism TSH elevated  free T4 on lower side likely from noncompliance.TSH elevated at 82.5 with low free T4 low at 0.54.   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 Synthroid expect that she may be closer to discussions for discharge by 6/18  Eye irritation:continue  eye drop.  DVT prophylaxis:lovenox Code Status: FULL Family Communication: plan of care discussed with patient's husband at bedside 6/15 No family present today Status is: Inpatient Remains inpatient appropriate because:Unsafe d/c plan and Ongoing confusion/dementia with agitation  Dispo: Patient From: Home  Planned Disposition: To be determined [facility] will be needed on discharge per psychiatry saw the patient  Expected discharge date: 2-3 days  Medically stable for discharge: No  Diet Order            DIET - DYS 1 Room service appropriate? Yes; Fluid consistency: Thin  Diet effective now                 Body mass index is 30.55 kg/m. Consultants: Psychiatry, neurology Procedures:see note Microbiology:see note  Medications: Scheduled Meds:  acetaminophen  650 mg Oral Q8H   carvedilol  3.125 mg Oral BID WC   enoxaparin (LOVENOX) injection  40 mg Subcutaneous Q24H   escitalopram  20 mg Oral Daily   [START ON 04/29/2020] levothyroxine  112 mcg Oral QAC breakfast   levothyroxine  50 mcg Intravenous Daily   OLANZapine  2.5 mg Oral BID   pantoprazole  40 mg Oral Daily   polyethylene glycol  17 g Oral Daily   polyvinyl alcohol  1 drop Both Eyes BID   simethicone  80 mg Oral QID   Continuous Infusions:  lactated ringers 75 mL/hr at 04/27/20 0557     Antimicrobials: Anti-infectives (From admission, onward)   None    Objective: Vitals: Today's Vitals   04/26/20 2052 04/27/20 0650 04/27/20 0815 04/27/20 1220  BP: 129/75 (!) 154/85 (!) 157/67 (!) 151/70  Pulse: 63 60 63 61  Resp: 16 17  15   Temp: 98.1 F (36.7 C) 97.9 F (36.6 C)    TempSrc: Oral Oral    SpO2: 95% 94%  97%  Weight:      Height:      PainSc:        Intake/Output Summary (Last 24 hours) at 04/27/2020 1542 Last data filed at 04/27/2020 1230 Gross per 24 hour  Intake 834 ml  Output 1750 ml  Net -916 ml   Filed Weights   04/16/20 0116 04/19/20 1255  Weight: 82.1 kg 83.3 kg   Weight change:    Intake/Output from previous day: 06/15 0701 - 06/16 0700 In: 1200 [P.O.:1200] Out: 850 [Urine:850] Intake/Output this shift: Total I/O In: 354 [P.O.:354] Out: 900 [Urine:900]  Examination: Refuses to open eyes but more coherent and cogent than yesterday and although is trying to get out of bed is easily redirectable compared to previously Answering more clearly Chest clear no added sound no rales no rhonchi Abdomen soft no rebound no guarding Neurologically intact no focal deficit No  lower extremity edema    Data Reviewed: I have personally reviewed following labs and imaging studies CBC: Recent Labs  Lab 04/21/20 0559 04/22/20 0539 04/27/20 0628  WBC 7.1 6.6 8.0  NEUTROABS  --   --  5.6  HGB 12.9 13.1 13.7  HCT 39.3 40.1 42.4  MCV 96.8 96.9 96.8  PLT 241 267 932   Basic Metabolic Panel: Recent Labs  Lab 04/21/20 0559 04/21/20 0559 04/22/20 0539 04/22/20 0539 04/23/20 0512 04/24/20 0546 04/25/20 0531 04/26/20 0552 04/27/20 0628  NA 137   < > 139   < > 138 140 138 137 138  K 4.3   < > 4.3   < > 4.4 4.7 4.5 4.5 4.5  CL 105   < > 102   < > 102 103 103 102 103  CO2 23   < > 26   < > 27 27 26 27 26   GLUCOSE 103*   < > 106*   < > 96 97 94 96 110*  BUN 21   < > 18   < > 16 23 23 16 10   CREATININE 1.58*   < > 1.38*   < > 1.16* 1.41*  1.31* 1.22* 1.00  CALCIUM 10.0   < > 10.0   < > 10.0 9.9 9.2 9.4 9.6  MG 2.0  --  1.9  --   --   --   --   --   --   PHOS 3.2  --   --   --   --   --   --   --   --    < > = values in this interval not displayed.  45 GFR: Estimated Creatinine Clearance: 50.2 mL/min (by C-G formula based on SCr of 1 mg/dL). Liver Function Tests: Recent Labs  Lab 04/23/20 0512 04/27/20 0628  AST 36 33  ALT 37 24  ALKPHOS 56 59  BILITOT 0.5 0.9  PROT 6.4* 6.2*  ALBUMIN 3.4* 3.2*   No results for input(s): LIPASE, AMYLASE in the last 168 hours. Recent Labs  Lab 04/23/20 0811  AMMONIA 14   Coagulation Profile: No results for input(s): INR, PROTIME in the last 168 hours. Cardiac Enzymes: No results for input(s): CKTOTAL, CKMB, CKMBINDEX, TROPONINI in the last 168 hours. BNP (last 3 results) No results for input(s): PROBNP in the last 8760 hours. HbA1C: No results for input(s): HGBA1C in the last 72 hours. CBG: No results for input(s): GLUCAP in the last 168 hours. Lipid Profile: No results for input(s): CHOL, HDL, LDLCALC, TRIG, CHOLHDL, LDLDIRECT in the last 72 hours. Thyroid Function Tests: Recent Labs    04/25/20 0531  TSH 44.683*   Anemia Panel: No results for input(s): VITAMINB12, FOLATE, FERRITIN, TIBC, IRON, RETICCTPCT in the last 72 hours. Sepsis Labs: No results for input(s): PROCALCITON, LATICACIDVEN in the last 168 hours.  No results found for this or any previous visit (from the past 240 hour(s)).    Radiology Studies: No results found.   LOS: 12 days   Nita Sells, MD [facility]  25 minutes  04/27/2020, 3:42 PM

## 2020-04-27 NOTE — Consult Note (Addendum)
  Attempted face-to-face psychiatry consult, patient resting comfortably, appeared asleep. Patient discussed with Dr. Lucianne Muss. Patient seen by psychiatry on yesterday.  Medication recommendation continue Zyprexa to 2.5 mg p.o. twice daily.  Recommend decrease Depakote to 500 mg nightly. Valproic acid level 115 on 04/27/2020.  Recommend recheck valproic level in 48 hours, on 04/29/2020.  Continue to recommend inpatient psychiatric treatment once medically clear.

## 2020-04-27 NOTE — TOC Progression Note (Signed)
Transition of Care [facility]) - Progression Note    Patient Details  Name: Kristin Grimes MRN: [id number] Date of Birth: 02-Jul-1941  Transition of [facility]) CM/SW Contact  Dawud Mays, Meriam Sprague, RN Phone Number: 04/27/2020, 3:39 PM  Clinical Narrative:    Psych recommendations continue to be for Inpatient psych. Pt continues to remain calm and is not combative anymore. This CM faxed pt out to following [facility] facilities again:  [facility]- No available beds [facility]    [facility] will continue to follow and assist with placement.    Social Determinants of Health (SDOH) Interventions    Readmission Risk Interventions Readmission Risk Prevention Plan 04/18/2020  Post Dischage Appt Complete  Medication Screening Complete  Transportation Screening Complete

## 2020-04-28 LAB — COMPREHENSIVE METABOLIC PANEL
ALT: 23 U/L (ref 0–44)
AST: 28 U/L (ref 15–41)
Albumin: 2.8 g/dL — ABNORMAL LOW (ref 3.5–5.0)
Alkaline Phosphatase: 66 U/L (ref 38–126)
Anion gap: 6 (ref 5–15)
BUN: 10 mg/dL (ref 8–23)
CO2: 27 mmol/L (ref 22–32)
Calcium: 9.2 mg/dL (ref 8.9–10.3)
Chloride: 105 mmol/L (ref 98–111)
Creatinine, Ser: 1.33 mg/dL — ABNORMAL HIGH (ref 0.44–1.00)
GFR calc Af Amer: 45 mL/min — ABNORMAL LOW (ref >60)
GFR calc non Af Amer: 38 mL/min — ABNORMAL LOW (ref >60)
Glucose, Bld: 97 mg/dL (ref 70–99)
Potassium: 4.5 mmol/L (ref 3.5–5.1)
Sodium: 138 mmol/L (ref 135–145)
Total Bilirubin: 0.7 mg/dL (ref 0.3–1.2)
Total Protein: 5.1 g/dL — ABNORMAL LOW (ref 6.5–8.1)

## 2020-04-28 LAB — TSH: TSH: 24.927 u[IU]/mL — ABNORMAL HIGH (ref 0.350–4.500)

## 2020-04-28 MED ORDER — LEVOTHYROXINE SODIUM 112 MCG PO TABS
112.0000 ug | ORAL_TABLET | Freq: Every day | ORAL | Status: DC
  Administered 2020-04-28 – 2020-05-09 (×10): 112 ug via ORAL
  Filled 2020-04-28 (×10): qty 1

## 2020-04-28 NOTE — Progress Notes (Signed)
PROGRESS NOTE    Kristin Grimes  YBO:[id number] DOB: [birth year] DOA: 04/15/2020 PCP: Patient, No Pcp Per   Chef Complaints: Agitation  Brief Narrative: 79 y.o.advanced dementia for the past 15 yrs per husband esophageal surgery unspecified moved from [place] with her husband presented to hospital with worsening mental status for the last 4 weeks since the move.   Family was concerned that it was a psychotic issue and the patient has been refusing to take p.o. with prior history of esophageal surgery.   There is some question about the syncopal event over the past few weeks.  Patient was brought into the hospital because of worsening mental status and combativeness with family at home.   In the ED, patient was evaluated. Routine lab work was obtained + markedly combative requiring 2 mg of IV Haldol with minimal to no improvement  given 40 mg of Geodon p.o. with moderate improvement but ultimately decision was made for one-to-one sitter to be at bedside. CT head given mental status changes and chest x-ray with no overt findings.  Patient's lab work was equally as unimpressive with minimally elevated glucose but otherwise unremarkable for any acute findings.  Patient was then admitted to the hospital for further evaluation and treatment.   Patient was monitored followed up.  No obvious infection identified CMP has been fairly stable. Mild elevated creatinine peaked to 1.7 and downtrending since Vitamin B12 at 2687, TSH elevated at 82.5 with low free T4 low at 0.54 ( nl 0.61-1.12) FT3 0.9 Prior esophagogram that showed pevious fundoplication, diverticulum  On 6/11 Husband reports they moved from [place] 4-week ago 1 week before admission patient has been increasingly restless agitated not sleeping and has not been eating which prompted to the ED visit as husband was unable to take care of her.  In [place] patient was displaying short-term memory loss with frequent forgetting  asking questions about was dressing herself able to feed herself going out for dinner and ambulating.   Subjective: More arousable Refusing my exam and swatting at my hand Raises voice and becomes a little agitated when she has to repeat herself Otherwise is much improved  Assessment & Plan:  Advanced dementia with behavioral issues/agitation: Per husband patient had short-term memory issues but was able to feed herself prior to moving from [place] 4 weeks ago and for 1 week prior to admission patient has been extremely agitated not sleeping.   ? Delirium /triggered her decline could be changing her location versus apathetic geriatric hypothyroidism causing mental decline Dr. Leonel Ramsay consulted-recommended EEG MRI Dr. Cheral Marker on 6/14 reviewed the case and felt that with improvements especially with negative MRI EEG this is primarily metabolic -Patient has calmed down significantly and has been seen by psychiatry as below .  CT head no acute finding LFTs stable no obvious infection  consulted psychiatry 6/10 medication adjustments made-somnolence improved reconsulting 6/14 and 6/15  Current meds include Lexapro 20, Zyprexa 2.5 bid  She is recommended for Geri psychiatric unit and has no bed  offers at this time and we await further disposition as per bed  availability  Dehydration/ketosisfrom poor oral intake.  Encourage oral hydration. 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 Husband did not wish to pursue on further invasive work-up for this.   Tolerating some diet  History of gaseous distention/dyspepsia continue Protonix, simethicone and MiraLAX.  Constipated for week per husband This seems to have resolved  ?Syncopal  event: Difficult to ascertain question orthostatics given patient's poor intake and improving  Mild AKI with poor oral intake.   Continue LR but cut back the rate to 40 cc  an hour 6/17 Creatinine stabilizing  Hypertension: On nifedipine lisinopril and Coreg at home-holding lisinopril and nifedipine.  Continue Coreg with holding parameters.    Hypothyroidism TSH elevated  free T4 on lower side likely from noncompliance.TSH elevated at 82.5 with low free T4 low at 0.54.   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  Eye irritation:continue  eye drop.  DVT prophylaxis:lovenox Code Status: FULL Family Communication: plan of care discussed with patient's husband at bedside 6/16 in detail  Status is: Inpatient Remains inpatient appropriate because:Awaiting bed at [facility] psych facility  Dispo: Patient From: Home  Planned Disposition: To be determined [facility] will be needed on discharge per psychiatry saw the patient  Expected discharge date: Hopefully the next several days  Medically stable for discharge: No  Diet Order            DIET - DYS 1 Room service appropriate? Yes; Fluid consistency: Thin  Diet effective now                 Body mass index is 30.55 kg/m. Consultants: Psychiatry, neurology Procedures:see note Microbiology:see note  Medications: Scheduled Meds: . acetaminophen  650 mg Oral Q8H  . carvedilol  3.125 mg Oral BID WC  . enoxaparin (LOVENOX) injection  40 mg Subcutaneous Q24H  . escitalopram  20 mg Oral Daily  . levothyroxine  112 mcg Oral QAC breakfast  . OLANZapine  2.5 mg Oral BID  . pantoprazole  40 mg Oral Daily  . polyethylene glycol  17 g Oral Daily  . polyvinyl alcohol  1 drop Both Eyes BID  . simethicone  80 mg Oral QID   Continuous Infusions: . lactated ringers 75 mL/hr at 04/27/20 1658     Antimicrobials: Anti-infectives (From admission, onward)   None    Objective: Vitals: Today's Vitals   04/27/20 2102 04/28/20 0400 04/28/20 0643 04/28/20 0658  BP: 137/73  (!) 195/98 108/79  Pulse: 68  73 73  Resp: 17  17   Temp: 98.3 F (36.8 C)  97.6 F (36.4 C)   TempSrc: Oral  Oral   SpO2: 94%  96%   Weight:      Height:      PainSc:  0-No pain      Intake/Output Summary (Last 24 hours) at 04/28/2020 1411 Last data filed at 04/28/2020 1216 Gross per 24 hour  Intake 480 ml  Output 150 ml  Net 330 ml   Filed Weights   04/16/20 0116 04/19/20 1255  Weight: 82.1 kg 83.3 kg   Weight change:    Intake/Output from previous day: 06/16 0701 - 06/17 0700 In: 354 [P.O.:354] Out: 900 [Urine:900] Intake/Output this shift: Total I/O In: 480 [P.O.:480] Out: 150 [Urine:150]  Examination: Gets irritated but arousable Her eyes open minimally Answering more clearly Chest clear no added sound no rales no rhonchi Abdomen soft no rebound no guarding Neurologically intact no focal deficit No lower extremity edema  Data Reviewed: I have personally reviewed following labs and imaging studies CBC: Recent Labs  Lab 04/22/20 0539 04/27/20 0628  WBC 6.6 8.0  NEUTROABS  --  5.6  HGB 13.1 13.7  HCT 40.1 42.4  MCV 96.9 96.8  PLT 267 243   Basic Metabolic Panel: Recent Labs  Lab 04/22/20 0539 04/23/20 0512 04/24/20 0546 04/25/20 0531 04/26/20 0552 04/27/20 0628 04/28/20 0821  NA 139   < > 140 138 137 138 138  K 4.3   < > 4.7 4.5 4.5 4.5 4.5  CL 102   < > 103 103 102 103 105  CO2 26   < > 27 26 27 26 27   GLUCOSE 106*   < > 97 94 96 110* 97  BUN 18   < > 23 23 16 10 10   CREATININE 1.38*   < > 1.41* 1.31* 1.22* 1.00 1.33*  CALCIUM 10.0   < > 9.9 9.2 9.4 9.6 9.2  MG 1.9  --   --   --   --   --   --    < > = values in this interval not displayed.  45 GFR: Estimated Creatinine Clearance: 37.7 mL/min (A) (by C-G formula based on SCr of 1.33 mg/dL (H)). Liver  Function Tests: Recent Labs  Lab 04/23/20 0512 04/27/20 0628 04/28/20 0821  AST 36 33 28  ALT 37 24 23  ALKPHOS 56 59 66  BILITOT 0.5 0.9 0.7  PROT 6.4* 6.2* 5.1*  ALBUMIN 3.4* 3.2* 2.8*   No results for input(s): LIPASE, AMYLASE in the last 168 hours. Recent Labs  Lab 04/23/20 0811  AMMONIA 14   Coagulation Profile: No results for input(s): INR, PROTIME in the last 168 hours. Cardiac Enzymes: No results for input(s): CKTOTAL, CKMB, CKMBINDEX, TROPONINI in the last 168 hours. BNP (last 3 results) No results for input(s): PROBNP in the last 8760 hours. HbA1C: No results for input(s): HGBA1C in the last 72 hours. CBG: No results for input(s): GLUCAP in the last 168 hours. Lipid Profile: No results for input(s): CHOL, HDL, LDLCALC, TRIG, CHOLHDL, LDLDIRECT in the last 72 hours. Thyroid Function Tests: Recent Labs    04/28/20 0821  TSH 24.927*   Anemia Panel: No results for input(s): VITAMINB12, FOLATE, FERRITIN, TIBC, IRON, RETICCTPCT in the last 72 hours. Sepsis Labs: No results for input(s): PROCALCITON, LATICACIDVEN in the last 168 hours.  No results found for this or any previous visit (from the past 240 hour(s)).    Radiology Studies: No results found.   LOS: 13 days   06/23/20, MD [facility]  25 minutes  04/28/2020, 2:11 PM

## 2020-04-28 NOTE — Progress Notes (Signed)
Physical Therapy Treatment Patient Details Name: Kristin Grimes MRN: [id number] DOB: 09/24/1941 Today's Date: 04/28/2020    History of Present Illness Pt is 79 yo female admitted with AMS, FTT. Family brought pt to ED-husband unable to care for pt due to dementia with behavioral disturbances.  Pt currently being managed with medications and has had varied behaviors.Marland Kitchen Hx of dementia, scoliosis, esophageal hernia.    PT Comments    Assisted NT with bed mobility, a wash up and repositioning as pt required + 2 assist.  Limited session due to pt's inability to follow directions as well as resistance associated with agitation.   Pt plans to D/C to SNF as spouse is no longer able to provide care pt needs.    Follow Up Recommendations  SNF;Supervision/Assistance - 24 hour     Equipment Recommendations  None recommended by PT    Recommendations for Other Services       Precautions / Restrictions Precautions Precautions: Fall Precaution Comments: level of cooperation/agitation varies Restrictions Weight Bearing Restrictions: No    Mobility  Bed Mobility Overal bed mobility: Needs Assistance Bed Mobility: Rolling Rolling: +2 for physical assistance;+2 for safety/equipment;Mod assist         General bed mobility comments: due to level of agitiation, only performed side to side rolling several time for hygfiene, peri care and linen change.  Pt resistant despite several attempts to reorient.  Transfers                 General transfer comment: unable to attempt any OOB actiovoty due to agitation/non complance with following directions  Ambulation/Gait                 Stairs             Wheelchair Mobility    Modified Rankin (Stroke Patients Only)       Balance                                            Cognition Arousal/Alertness: Awake/alert Behavior During Therapy: Agitated Overall Cognitive Status: No family/caregiver  present to determine baseline cognitive functioning                                 General Comments: pt AxO self only with inconsistant replies easily agittated and cursing      Exercises      General Comments        Pertinent Vitals/Pain Pain Assessment: No/denies pain    Home Living                      Prior Function            PT Goals (current goals can now be found in the care plan section) Progress towards PT goals: Progressing toward goals    Frequency    Min 2X/week      PT Plan Current plan remains appropriate    Co-evaluation              AM-PAC PT "6 Clicks" Mobility   Outcome Measure  Help needed turning from your back to your side while in a flat bed without using bedrails?: Total Help needed moving from lying on your back to sitting on the side of a flat bed without using bedrails?: Total  Help needed moving to and from a bed to a chair (including a wheelchair)?: Total Help needed standing up from a chair using your arms (e.g., wheelchair or bedside chair)?: Total Help needed to walk in hospital room?: Total Help needed climbing 3-5 steps with a railing? : Total 6 Click Score: 6    End of Session     Patient left: in bed;with bed alarm set;with call bell/phone within reach   PT Visit Diagnosis: Muscle weakness (generalized) (M62.81);Adult, failure to thrive (R62.7);Difficulty in walking, not elsewhere classified (R26.2)     Time: 1135-1150 PT Time Calculation (min) (ACUTE ONLY): 15 min  Charges:  $Therapeutic Activity: 23-37 mins                     Rica Koyanagi  PTA Acute  Rehabilitation Services Pager      [phone] Office      [phone]

## 2020-04-28 NOTE — TOC Progression Note (Addendum)
Transition of Care [facility]) - Progression Note    Patient Details  Name: Kristin Grimes MRN: [id number] Date of Birth: July 01, 1941  Transition of Care [facility]) CM/SW Contact  Sasha Rogel, Meriam Sprague, RN Phone Number: 04/28/2020, 2:10 PM  Clinical Narrative:     The following [facility] facilities have been called today to inquire about bed availability:   [facility]- Declined pt [facility]- Not taking patients- Covid [place]- [facility]- asked to call back tomorrow [facility]- [facility] unit under construction [facility]- No beds available [facility]- Left message [facility]- Not taking [facility] referrals [facility]- On wait list for Monday 6/21 [place]- They are looking at referral and will call back

## 2020-04-29 LAB — CBC WITH DIFFERENTIAL/PLATELET
Abs Immature Granulocytes: 0.08 10*3/uL — ABNORMAL HIGH (ref 0.00–0.07)
Basophils Absolute: 0 10*3/uL (ref 0.0–0.1)
Basophils Relative: 0 %
Eosinophils Absolute: 0.2 10*3/uL (ref 0.0–0.5)
Eosinophils Relative: 2 %
HCT: 40.3 % (ref 36.0–46.0)
Hemoglobin: 13.2 g/dL (ref 12.0–15.0)
Immature Granulocytes: 1 %
Lymphocytes Relative: 17 %
Lymphs Abs: 1.3 10*3/uL (ref 0.7–4.0)
MCH: 31.9 pg (ref 26.0–34.0)
MCHC: 32.8 g/dL (ref 30.0–36.0)
MCV: 97.3 fL (ref 80.0–100.0)
Monocytes Absolute: 0.9 10*3/uL (ref 0.1–1.0)
Monocytes Relative: 13 %
Neutro Abs: 4.9 10*3/uL (ref 1.7–7.7)
Neutrophils Relative %: 67 %
Platelets: 224 10*3/uL (ref 150–400)
RBC: 4.14 MIL/uL (ref 3.87–5.11)
RDW: 14.1 % (ref 11.5–15.5)
WBC: 7.4 10*3/uL (ref 4.0–10.5)
nRBC: 0 % (ref 0.0–0.2)

## 2020-04-29 LAB — BASIC METABOLIC PANEL
Anion gap: 7 (ref 5–15)
BUN: 10 mg/dL (ref 8–23)
CO2: 26 mmol/L (ref 22–32)
Calcium: 9.6 mg/dL (ref 8.9–10.3)
Chloride: 105 mmol/L (ref 98–111)
Creatinine, Ser: 1.17 mg/dL — ABNORMAL HIGH (ref 0.44–1.00)
GFR calc Af Amer: 52 mL/min — ABNORMAL LOW (ref >60)
GFR calc non Af Amer: 45 mL/min — ABNORMAL LOW (ref >60)
Glucose, Bld: 105 mg/dL — ABNORMAL HIGH (ref 70–99)
Potassium: 4.4 mmol/L (ref 3.5–5.1)
Sodium: 138 mmol/L (ref 135–145)

## 2020-04-29 NOTE — Progress Notes (Signed)
PROGRESS NOTE Ramiah Helfrich  JAS:[id number] DOB: 03/20/1941 DOA: 04/15/2020 PCP: Patient, No Pcp Per  Chef Complaints: Agitation Brief Narrative: 79 y.o.advanced dementia for the past 15 yrs per husband esophageal surgery unspecified moved from [place] with her husband presented to hospital with worsening mental status for the last 4 weeks since the move.  Family was concerned that it was a psychotic issue and the patient has been refusing to take p.o. with prior history of esophageal surgery.   There is some question about the syncopal event over the past few weeks.  Patient was brought into the hospital because of worsening mental status and combativeness with family at home.   In the ED, patient was evaluated. Routine lab work was obtained + markedly combative requiring 2 mg of IV Haldol with minimal to no improvement given 40 mg of Geodon p.o. with moderate improvement but ultimately decision was made for one-to-one sitter to be at bedside. CT head given mental status changes and chest x-ray with no overt findings.   lab work unimpressive.   EXCEPT TSH elevated at 82.5 with low free T4 low at 0.54 ( nl 0.61-1.12) FT3 0.9 + AKI Prior esophagogram that showed pevious fundoplication, diverticulum  On 6/11 Husband reports they moved from [place] 4-week ago 1 week before admission patient has been increasingly restless agitated not sleeping and has not been eating which prompted to the ED visit as husband was unable to take care of her.  In [place] patient was displaying short-term memory loss with frequent forgetting asking questions about was dressing herself able to feed herself going out for dinner and ambulating.  Subjective: About the same  Interactive eating and drinking  Assessment & Plan:  Advanced dementia with behavioral issues/agitation: Per husband patient had short-term memory issues but was able to feed herself prior to moving from [place] 4 weeks ago and for 1  week prior to admission patient has been extremely agitated not sleeping.   ? Delirium /triggered her decline could be changing her location versus apathetic geriatric hypothyroidism causing mental decline Dr. Amada Jupiter consulted-recommended EEG MRI Dr. Otelia Limes on 6/14 reviewed the case and felt that with improvements especially with negative MRI EEG this is primarily metabolic -Patient has calmed down significantly and has been seen by psychiatry as below CT head no acute finding LFTs stable no obvious infection  consulted psychiatry 6/10 medication adjustments made-somnolence improved reconsulting 6/14 and 6/15  Current meds include Lexapro 20, Zyprexa 2.5 bid  [facility] and has no bed  offers at this time and  we await further disposition as per bed    Availability greatly appreciate psychiatry follow-up  Dehydration/ketosisfrom poor oral intake.  Encourage oral hydration. 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 Husband did not wish to pursue on further invasive work-up for this.   Tolerating some diet  History of gaseous distention/dyspepsia continue Protonix, simethicone and MiraLAX.  Constipated for week per husband This seems to have resolved  ?Syncopal event: Difficult to ascertain question orthostatics given patient's poor intake and improving  Mild AKI with poor oral intake.   Continue LR but cut back the rate to 40 cc an hour 6/17 Creatinine stabilizing Keep iv in but might NSL in am  Hypertension: On nifedipine lisinopril and Coreg at home-holding lisinopril and nifedipine.  Continue Coreg with holding parameters.    Hypothyroidism TSH elevated  free T4 on lower side likely from noncompliance.TSH elevated at 82.5  with low free T4 low at 0.54.   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 Recheck TSH in 1 week on 6/25  Eye irritation:continue  eye drop.  DVT prophylaxis:lovenox Code Status: FULL Family Communication: plan of care discussed with patient's husband and sone at bedside 6/18  Status is: Inpatient Remains inpatient appropriate because:Awaiting bed at [place]: Patient From: Home  Planned Disposition: To be determined [facility] will be needed on discharge per psychiatry saw the patient  Expected discharge date: Hopefully the next several days  Medically stable for discharge: No  Diet Order            DIET - DYS 1 Room service appropriate? Yes; Fluid consistency: Thin  Diet effective now                 Body mass index is 30.55 kg/m. Consultants: Psychiatry, neurology Procedures:see note Microbiology:see note  Medications: Scheduled Meds: . acetaminophen  650 mg Oral Q8H  . carvedilol  3.125 mg Oral BID WC  . enoxaparin (LOVENOX) injection  40 mg Subcutaneous Q24H  . escitalopram  20 mg Oral Daily  . levothyroxine  112 mcg Oral QAC breakfast  . OLANZapine  2.5 mg Oral BID  . pantoprazole  40 mg Oral Daily  . polyethylene glycol  17 g Oral Daily  . polyvinyl alcohol  1 drop Both Eyes BID  . simethicone  80 mg Oral QID   Continuous Infusions: . lactated ringers 40 mL/hr at 04/28/20 1435   Antimicrobials: Anti-infectives (From admission, onward)   None    Objective: Vitals: Today's Vitals   04/28/20 2200 04/29/20 0640 04/29/20 0850 04/29/20 1448  BP:  (!) 130/112  (!) 175/89  Pulse:  65  67  Resp:  20  18  Temp:  97.6 F (36.4 C)  98.9 F (37.2 C)  TempSrc:  Oral  Oral  SpO2:  90%  94%  Weight:      Height:      PainSc: 0-No pain  0-No pain     Intake/Output Summary (Last 24  hours) at 04/29/2020 1555 Last data filed at 04/29/2020 1042 Gross per 24 hour  Intake 480 ml  Output 700 ml  Net -220 ml   Filed Weights   04/16/20 0116 04/19/20 1255  Weight: 82.1 kg 83.3 kg   Weight change:    Intake/Output from previous day: 06/17 0701 - 06/18 0700 In: 720 [P.O.:720] Out: 850 [Urine:850] Intake/Output this shift: Total I/O In: 240 [P.O.:240] Out: -   Examination:  arousable Her eyes open minimally Answering more clearly Chest clear no added sound no rales no rhonchi Abdomen soft no rebound no guarding Neurologically intact no focal deficit No lower extremity edema    Data Reviewed: I have personally reviewed following labs and imaging studies CBC: Recent Labs  Lab 04/27/20 0628 04/29/20 0910  WBC 8.0 7.4  NEUTROABS 5.6 4.9  HGB 13.7 13.2  HCT 42.4 40.3  MCV 96.8 97.3  PLT 243 063   Basic Metabolic Panel: Recent Labs  Lab 04/25/20 0531 04/26/20 0552 04/27/20 0628 04/28/20 0821 04/29/20 0910  NA  138 137 138 138 138  K 4.5 4.5 4.5 4.5 4.4  CL 103 102 103 105 105  CO2 26 27 26 27 26   GLUCOSE 94 96 110* 97 105*  BUN 23 16 10 10 10   CREATININE 1.31* 1.22* 1.00 1.33* 1.17*  CALCIUM 9.2 9.4 9.6 9.2 9.6  45 GFR: Estimated Creatinine Clearance: 42.9 mL/min (A) (by C-G formula based on SCr of 1.17 mg/dL (H)). Liver Function Tests: Recent Labs  Lab 04/23/20 0512 04/27/20 0628 04/28/20 0821  AST 36 33 28  ALT 37 24 23  ALKPHOS 56 59 66  BILITOT 0.5 0.9 0.7  PROT 6.4* 6.2* 5.1*  ALBUMIN 3.4* 3.2* 2.8*   No results for input(s): LIPASE, AMYLASE in the last 168 hours. Recent Labs  Lab 04/23/20 0811  AMMONIA 14   Coagulation Profile: No results for input(s): INR, PROTIME in the last 168 hours. Cardiac Enzymes: No results for input(s): CKTOTAL, CKMB, CKMBINDEX, TROPONINI in the last 168 hours. BNP (last 3 results) No results for input(s): PROBNP in the last 8760 hours. HbA1C: No results for input(s): HGBA1C in the last 72  hours. CBG: No results for input(s): GLUCAP in the last 168 hours. Lipid Profile: No results for input(s): CHOL, HDL, LDLCALC, TRIG, CHOLHDL, LDLDIRECT in the last 72 hours. Thyroid Function Tests: Recent Labs    04/28/20 0821  TSH 24.927*   Anemia Panel: No results for input(s): VITAMINB12, FOLATE, FERRITIN, TIBC, IRON, RETICCTPCT in the last 72 hours. Sepsis Labs: No results for input(s): PROCALCITON, LATICACIDVEN in the last 168 hours.  No results found for this or any previous visit (from the past 240 hour(s)).    Radiology Studies: No results found.   LOS: 14 days   06/23/20, MD [facility]  25 minutes  04/29/2020, 3:55 PM

## 2020-04-30 NOTE — Progress Notes (Signed)
PROGRESS NOTE Kristin Grimes  ZOX:[id number] DOB: 12/07/1940 DOA: 04/15/2020 PCP: Patient, No Pcp Per  Chef Complaints: Agitation Brief Narrative: 79 y.o.advanced dementia for the past 15 yrs per husband esophageal surgery unspecified moved from [place] with her husband presented to hospital with worsening mental status for the last 4 weeks since the move.  Family was concerned that it was a psychotic issue and the patient has been refusing to take p.o. with prior history of esophageal surgery.   There is some question about the syncopal event over the past few weeks.  Patient was brought into the hospital because of worsening mental status and combativeness with family at home.   In the ED, patient was evaluated. Routine lab work was obtained + markedly combative requiring 2 mg of IV Haldol with minimal to no improvement given 40 mg of Geodon p.o. with moderate improvement but ultimately decision was made for one-to-one sitter to be at bedside. CT head given mental status changes and chest x-ray with no overt findings.   lab work unimpressive.   EXCEPT TSH elevated at 82.5 with low free T4 low at 0.54 ( nl 0.61-1.12) FT3 0.9 + AKI Prior esophagogram that showed pevious fundoplication, diverticulum  On 6/11 Husband reports they moved from [place] 4-week ago 1 week before admission patient has been increasingly restless agitated not sleeping and has not been eating which prompted to the ED visit as husband was unable to take care of her.  In [place] patient was displaying short-term memory loss with frequent forgetting asking questions about was dressing herself able to feed herself going out for dinner and ambulating.  Subjective: About the same  Interactive eating and drinking  Assessment & Plan:  Advanced dementia with behavioral issues/agitation: Per husband patient had short-term memory issues but was able to feed herself prior to moving from [place] 4 weeks ago and for 1  week prior to admission patient has been extremely agitated not sleeping.   ? Delirium /triggered her decline could be changing her location versus apathetic geriatric hypothyroidism causing mental decline Dr. Amada Jupiter consulted- CT head no acute finding LFTs stable no obvious infection D/w Dr. Otelia Limes on 6/14 reviewed the case and felt that with improvements especially with negative MRI EEG this is primarily metabolic - calmed down significantly  Re-consulted psychiatry 6/10 medication adjustments made-somnolence improved reconsulting 6/14 and 6/15  Current meds include Lexapro 20, Zyprexa 2.5 bid  [facility] and has no bed  offers at this time and  we await further disposition as per bed    Availability greatly appreciate psychiatry follow-up  Dehydration/ketosisfrom poor oral intake.  Encourage oral hydration. 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 Husband did not wish to pursue on further invasive work-up for this.   Tolerating some diet-but eating less tday  History of gaseous distention/dyspepsia continue Protonix, simethicone and MiraLAX.  Constipated for week per husband This seems to have resolved  ?Syncopal event: Difficult to ascertain question orthostatics given patient's poor intake and improving  Mild AKI with poor oral intake-resovled Saline lock iv today 6/19 Creatinine stable  Hypertension: On nifedipine lisinopril and Coreg at home-holding lisinopril and nifedipine.  Continue Coreg with holding parameters.    Hypothyroidism apathetic hypothyroidism  TSH elevated  free T4 on lower side likely from noncompliance. TSH on admit 82.5 with low free T4 low at 0.54.   Repeat TSH 44  Supplementing with 50 mcg IV daily for 4  doses in addition to 112 orally We will discontinue the IV Synthroid on 6/17 and continue her home dose of 112 p.o. Recheck TSH in 1 week on 6/25  Eye  irritation:continue  eye drop.  DVT prophylaxis:lovenox Code Status: FULL Family Communication: plan of care discussed with patient's husband and son at bedside 6/18 No family + today  Status is: Inpatient Remains inpatient appropriate because:Awaiting bed at [facility] psych facility  Dispo: Patient From: Home  Planned Disposition: To be determined [facility] will be needed on discharge per psychiatry saw the patient  Expected discharge date: Hopefully the next several days  Medically stable for discharge: No  Diet Order            DIET - DYS 1 Room service appropriate? Yes; Fluid consistency: Thin  Diet effective now                 Body mass index is 30.55 kg/m. Consultants: Psychiatry, neurology Procedures:see note Microbiology:see note  Medications: Scheduled Meds: . acetaminophen  650 mg Oral Q8H  . carvedilol  3.125 mg Oral BID WC  . enoxaparin (LOVENOX) injection  40 mg Subcutaneous Q24H  . escitalopram  20 mg Oral Daily  . levothyroxine  112 mcg Oral QAC breakfast  . OLANZapine  2.5 mg Oral BID  . pantoprazole  40 mg Oral Daily  . polyethylene glycol  17 g Oral Daily  . polyvinyl alcohol  1 drop Both Eyes BID  . simethicone  80 mg Oral QID   Continuous Infusions: . lactated ringers 40 mL/hr at 04/29/20 2200   Antimicrobials: Anti-infectives (From admission, onward)   None    Objective: Vitals: Today's Vitals   04/29/20 2200 04/30/20 0524 04/30/20 0800 04/30/20 1253  BP:  (!) 180/90  (!) 164/70  Pulse:  75  69  Resp:  14  18  Temp:  97.7 F (36.5 C)  98.5 F (36.9 C)  TempSrc:  Oral  Oral  SpO2:  94%  94%  Weight:      Height:      PainSc: 0-No pain  1      Intake/Output Summary (Last 24 hours) at 04/30/2020 1723 Last data filed at 04/30/2020 1500 Gross per 24 hour  Intake 360 ml  Output --  Net 360 ml   Filed Weights   04/16/20 0116 04/19/20 1255  Weight: 82.1 kg 83.3 kg   Weight change:    Intake/Output from previous day: 06/18 0701 -  06/19 0700 In: 480 [P.O.:480] Out: 900 [Urine:900] Intake/Output this shift: Total I/O In: 360 [P.O.:360] Out: -   Examination: Opens eyes Cooperative to a degree Answering more clearly Chest clear no added sound no rales no rhonchi Abdomen soft no rebound no guarding Neurologically intact no focal deficit No lower extremity edema    Data Reviewed: I have personally reviewed following labs and imaging studies CBC: Recent Labs  Lab 04/27/20 0628 04/29/20 0910  WBC 8.0 7.4  NEUTROABS 5.6 4.9  HGB 13.7 13.2  HCT 42.4 40.3  MCV 96.8 97.3  PLT 243 224   Basic Metabolic Panel: Recent Labs  Lab 04/25/20 0531 04/26/20 0552 04/27/20 0628 04/28/20 0821 04/29/20 0910  NA 138 137 138 138 138  K 4.5 4.5 4.5 4.5 4.4  CL 103 102 103 105 105  CO2 26 27 26 27 26   GLUCOSE 94 96 110* 97 105*  BUN 23 16 10 10 10   CREATININE 1.31* 1.22* 1.00 1.33* 1.17*  CALCIUM 9.2 9.4 9.6 9.2  9.6  45 GFR: Estimated Creatinine Clearance: 42.9 mL/min (A) (by C-G formula based on SCr of 1.17 mg/dL (H)). Liver Function Tests: Recent Labs  Lab 04/27/20 0628 04/28/20 0821  AST 33 28  ALT 24 23  ALKPHOS 59 66  BILITOT 0.9 0.7  PROT 6.2* 5.1*  ALBUMIN 3.2* 2.8*   No results for input(s): LIPASE, AMYLASE in the last 168 hours. No results for input(s): AMMONIA in the last 168 hours. Coagulation Profile: No results for input(s): INR, PROTIME in the last 168 hours. Cardiac Enzymes: No results for input(s): CKTOTAL, CKMB, CKMBINDEX, TROPONINI in the last 168 hours. BNP (last 3 results) No results for input(s): PROBNP in the last 8760 hours. HbA1C: No results for input(s): HGBA1C in the last 72 hours. CBG: No results for input(s): GLUCAP in the last 168 hours. Lipid Profile: No results for input(s): CHOL, HDL, LDLCALC, TRIG, CHOLHDL, LDLDIRECT in the last 72 hours. Thyroid Function Tests: Recent Labs    04/28/20 0821  TSH 24.927*   Anemia Panel: No results for input(s): VITAMINB12,  FOLATE, FERRITIN, TIBC, IRON, RETICCTPCT in the last 72 hours. Sepsis Labs: No results for input(s): PROCALCITON, LATICACIDVEN in the last 168 hours.  No results found for this or any previous visit (from the past 240 hour(s)).    Radiology Studies: No results found.   LOS: 15 days   Nita Sells, MD [facility]  15 minutes  04/30/2020, 5:23 PM

## 2020-05-01 NOTE — Progress Notes (Signed)
PROGRESS NOTE Kristin Grimes  GXQ:[id number] DOB: [birth year] DOA: 04/15/2020 PCP: Patient, No Pcp Per  Chef Complaints: Agitation Brief Narrative: 79 y.o.advanced dementia for the past 15 yrs per husband esophageal surgery unspecified moved from [place] with her husband presented to hospital with worsening mental status for the last 4 weeks since the move.  Family was concerned that it was a psychotic issue and the patient has been refusing to take p.o. with prior history of esophageal surgery.   There is some question about the syncopal event over the past few weeks.  Patient was brought into the hospital because of worsening mental status and combativeness with family at home.   In the ED, patient was evaluated. Routine lab work was obtained + markedly combative requiring 2 mg of IV Haldol with minimal to no improvement given 40 mg of Geodon p.o. with moderate improvement but ultimately decision was made for one-to-one sitter to be at bedside. CT head given mental status changes and chest x-ray with no overt findings.   lab work unimpressive.   EXCEPT TSH elevated at 82.5 with low free T4 low at 0.54 ( nl 0.61-1.12) FT3 0.9 + AKI Prior esophagogram that showed pevious fundoplication, diverticulum  On 6/11 Husband reports they moved from [place] 4-week ago 1 week before admission patient has been increasingly restless agitated not sleeping and has not been eating which prompted to the ED visit as husband was unable to take care of her.  In [place] patient was displaying short-term memory loss with frequent forgetting asking questions about was dressing herself able to feed herself going out for dinner and ambulating.  Subjective:  Quite sleepy today I did not disturb her  Assessment & Plan:  Advanced dementia with behavioral issues/agitation: Per husband patient had short-term memory issues but was able to feed herself prior to moving from [place] 4 weeks ago and for 1 week  prior to admission patient has been extremely agitated not sleeping.   ? Delirium /triggered her decline could be changing her location versus apathetic geriatric hypothyroidism causing mental decline Dr. Leonel Ramsay consulted- CT head no acute finding LFTs stable no obvious infection D/w Dr. Cheral Marker on 6/14 reviewed the case and felt that with improvements especially with negative MRI EEG this is primarily metabolic - calmed down significantly  Re-consulted psychiatry 6/10 medication adjustments made-somnolence improved reconsulting 6/14 and 6/15  Current meds include Lexapro 20, Zyprexa 2.5 bid  [facility] and has no bed offers at this time and  we  await further disposition as per bed    Availability greatly appreciate psychiatry follow-up  Dehydration/ketosis from poor oral intake.  Encourage oral hydration. 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 Husband did not wish to pursue on further invasive work-up for this.   Tolerating some diet-but eating less tday  History of gaseous distention/dyspepsia continue Protonix, simethicone and MiraLAX.  Constipated for week per husband This seems to have resolved  ?Syncopal event: Difficult to ascertain question orthostatics given patient's poor intake and improving  Mild AKI with poor oral intake-resovled Saline lock iv today 6/19 Creatinine stable  Hypertension: On nifedipine lisinopril and Coreg at home-holding lisinopril and nifedipine.  Continue Coreg with holding parameters.    Hypothyroidism apathetic hypothyroidism  TSH elevated  free T4 on lower side likely from noncompliance. TSH on admit 82.5 with low free T4 low at 0.54.   Repeat TSH 44  Received 50 mcg IV daily for  4 doses in addition to 112 orally We will discontinue the IV Synthroid on 6/17 and continue her home dose of 112 p.o. Recheck TSH in 1 week on 6/25  Eye irritation:continue   eye drop.  DVT prophylaxis:lovenox Code Status: FULL Family Communication: plan of care discussed with patient's husband and son at bedside 6/18 No family + today  Status is: Inpatient Remains inpatient appropriate because:[facility]wait[facility] at [facility] psych facility  Dispo: Patient From: Home  Planned Disposition: To be determined [facility] will be needed on discharge per psychiatry saw the patient  Expected discharge date: Hopefully the next several days  Medically stable for discharge: No  Diet Order            DIET - DYS 1 Room service appropriate? Yes; Fluid consistency: Thin  Diet effective now                 Body mass index is 30.55 kg/m. Consultants: Psychiatry, neurology Procedures:see note Microbiology:see note  Medications: Scheduled Meds: . acetaminophen  650 mg Oral Q8H  . carvedilol  3.125 mg Oral BID WC  . enoxaparin (LOVENOX) injection  40 mg Subcutaneous Q24H  . escitalopram  20 mg Oral Daily  . levothyroxine  112 mcg Oral QAC breakfast  . OLANZapine  2.5 mg Oral BID  . pantoprazole  40 mg Oral Daily  . polyethylene glycol  17 g Oral Daily  . polyvinyl alcohol  1 drop Both Eyes BID  . simethicone  80 mg Oral QID   Continuous Infusions: . lactated ringers 40 mL/hr at 04/29/20 2200   Antimicrobials: Anti-infectives (From admission, onward)   None    Objective: Vitals: Today's Vitals   04/30/20 2044 05/01/20 0026 05/01/20 0601 05/01/20 1509  BP: (!) 148/72  (!) 177/98 (!) 164/126  Pulse: 64  70 78  Resp: 17  16 20   Temp: 98.4 F (36.9 C)  98.1 F (36.7 C) 98.2 F (36.8 C)  TempSrc: Oral  Oral Oral  SpO2: 95%  97% 92%  Weight:      Height:      PainSc:  Asleep      Intake/Output Summary (Last 24 hours) at 05/01/2020 1629 Last data filed at 05/01/2020 1200 Gross per 24 hour  Intake 900 ml  Output --  Net 900 ml   Filed Weights   04/16/20 0116 04/19/20 1255  Weight: 82.1 kg 83.3 kg   Weight change:    Intake/Output from previous  day: 06/19 0701 - 06/20 0700 In: 780 [P.O.:780] Out: -  Intake/Output this shift: Total I/O In: 480 [P.O.:480] Out: -   Examination:  Sleepy chest clear S1-S2 I did not attempt further exam   Data Reviewed: I have personally reviewed following labs and imaging studies CBC: Recent Labs  Lab 04/27/20 0628 04/29/20 0910  WBC 8.0 7.4  NEUTROABS 5.6 4.9  HGB 13.7 13.2  HCT 42.4 40.3  MCV 96.8 97.3  PLT 243 224   Basic Metabolic Panel: Recent Labs  Lab 04/25/20 0531 04/26/20 0552 04/27/20 0628 04/28/20 0821 04/29/20 0910  NA 138 137 138 138 138  K 4.5 4.5 4.5 4.5 4.4  CL 103 102 103 105 105  CO2 26 27 26 27 26   GLUCOSE 94 96 110* 97 105*  BUN 23 16 10 10 10   CREATININE 1.31* 1.22* 1.00 1.33* 1.17*  CALCIUM 9.2 9.4 9.6 9.2 9.6  45 GFR: Estimated Creatinine Clearance: 42.9 mL/min (A) (by C-G formula based on SCr of 1.17 mg/dL (H)).  Liver Function Tests: Recent Labs  Lab 04/27/20 0628 04/28/20 0821  AST 33 28  ALT 24 23  ALKPHOS 59 66  BILITOT 0.9 0.7  PROT 6.2* 5.1*  ALBUMIN 3.2* 2.8*   No results for input(s): LIPASE, AMYLASE in the last 168 hours. No results for input(s): AMMONIA in the last 168 hours. Coagulation Profile: No results for input(s): INR, PROTIME in the last 168 hours. Cardiac Enzymes: No results for input(s): CKTOTAL, CKMB, CKMBINDEX, TROPONINI in the last 168 hours. BNP (last 3 results) No results for input(s): PROBNP in the last 8760 hours. HbA1C: No results for input(s): HGBA1C in the last 72 hours. CBG: No results for input(s): GLUCAP in the last 168 hours. Lipid Profile: No results for input(s): CHOL, HDL, LDLCALC, TRIG, CHOLHDL, LDLDIRECT in the last 72 hours. Thyroid Function Tests: No results for input(s): TSH, T4TOTAL, FREET4, T3FREE, THYROIDAB in the last 72 hours. Anemia Panel: No results for input(s): VITAMINB12, FOLATE, FERRITIN, TIBC, IRON, RETICCTPCT in the last 72 hours. Sepsis Labs: No results for input(s):  PROCALCITON, LATICACIDVEN in the last 168 hours.  No results found for this or any previous visit (from the past 240 hour(s)).    Radiology Studies: No results found.   LOS: 16 days   Rhetta Mura, MD [facility]  15 minutes  05/01/2020, 4:29 PM

## 2020-05-02 LAB — COMPREHENSIVE METABOLIC PANEL
ALT: 27 U/L (ref 0–44)
AST: 25 U/L (ref 15–41)
Albumin: 3.2 g/dL — ABNORMAL LOW (ref 3.5–5.0)
Alkaline Phosphatase: 76 U/L (ref 38–126)
Anion gap: 10 (ref 5–15)
BUN: 10 mg/dL (ref 8–23)
CO2: 24 mmol/L (ref 22–32)
Calcium: 9.1 mg/dL (ref 8.9–10.3)
Chloride: 101 mmol/L (ref 98–111)
Creatinine, Ser: 1.13 mg/dL — ABNORMAL HIGH (ref 0.44–1.00)
GFR calc Af Amer: 54 mL/min — ABNORMAL LOW (ref >60)
GFR calc non Af Amer: 47 mL/min — ABNORMAL LOW (ref >60)
Glucose, Bld: 104 mg/dL — ABNORMAL HIGH (ref 70–99)
Potassium: 3.9 mmol/L (ref 3.5–5.1)
Sodium: 135 mmol/L (ref 135–145)
Total Bilirubin: 0.4 mg/dL (ref 0.3–1.2)
Total Protein: 6.1 g/dL — ABNORMAL LOW (ref 6.5–8.1)

## 2020-05-02 LAB — CBC WITH DIFFERENTIAL/PLATELET
Abs Immature Granulocytes: 0.08 10*3/uL — ABNORMAL HIGH (ref 0.00–0.07)
Basophils Absolute: 0.1 10*3/uL (ref 0.0–0.1)
Basophils Relative: 1 %
Eosinophils Absolute: 0.2 10*3/uL (ref 0.0–0.5)
Eosinophils Relative: 3 %
HCT: 39.5 % (ref 36.0–46.0)
Hemoglobin: 12.7 g/dL (ref 12.0–15.0)
Immature Granulocytes: 1 %
Lymphocytes Relative: 26 %
Lymphs Abs: 1.9 10*3/uL (ref 0.7–4.0)
MCH: 31.1 pg (ref 26.0–34.0)
MCHC: 32.2 g/dL (ref 30.0–36.0)
MCV: 96.6 fL (ref 80.0–100.0)
Monocytes Absolute: 1.4 10*3/uL — ABNORMAL HIGH (ref 0.1–1.0)
Monocytes Relative: 19 %
Neutro Abs: 3.7 10*3/uL (ref 1.7–7.7)
Neutrophils Relative %: 50 %
Platelets: 260 10*3/uL (ref 150–400)
RBC: 4.09 MIL/uL (ref 3.87–5.11)
RDW: 14.1 % (ref 11.5–15.5)
WBC: 7.3 10*3/uL (ref 4.0–10.5)
nRBC: 0 % (ref 0.0–0.2)

## 2020-05-02 MED ORDER — OLANZAPINE 5 MG PO TABS: 5.0000 mg | ORAL_TABLET | Freq: Every day | ORAL | Status: DC

## 2020-05-02 MED ORDER — LORAZEPAM 2 MG/ML IJ SOLN
1.0000 mg | Freq: Once | INTRAMUSCULAR | Status: AC
  Administered 2020-05-03: 1 mg via INTRAVENOUS
  Filled 2020-05-02 (×2): qty 1

## 2020-05-02 MED ORDER — OLANZAPINE 5 MG PO TABS
2.5000 mg | ORAL_TABLET | Freq: Every day | ORAL | Status: DC
  Administered 2020-05-03: 2.5 mg via ORAL
  Filled 2020-05-02: qty 1

## 2020-05-02 NOTE — TOC Progression Note (Addendum)
Transition of Care [facility]) - Progression Note    Patient Details  Name: Kristin Grimes MRN: [id number] Date of Birth: Mar 14, 1941  Transition [facility]) CM/SW Contact  Sonja Manseau, Meriam Sprague, RN Phone Number: 05/02/2020, 2:04 PM  Clinical Narrative:     The following [facility] facilities have been called today to inquire about bed availability:   [facility]- Declined pt [facility]- Not taking patients- Covid [place]- No Bed Available [facility]- No Bed Available [facility] unit under construction [facility]- No beds available [facility]- No Bed available [place]- Not taking [facility] referrals [facility]- States she is FTT and needs palliative care.  [place]- Declined pt for second time.  This CM placed pt on the [facility] list on 6/21

## 2020-05-02 NOTE — Progress Notes (Signed)
   05/02/20 1714  What Happened  Was fall witnessed? No  Was patient injured? No  Patient found on floor;other (Comment) (floor mat)  Found by Staff-comment  Stated prior activity other (comment) (pt in bed, restless and throwing legs over rails)  Follow Up  MD notified Dr Mahala Menghini  Time MD notified [phone]  Family notified Yes - comment  Time family notified 1730 (husband)  Additional tests No  Progress note created (see row info) Yes  Adult Fall Risk Assessment  Risk Factor Category (scoring not indicated) High fall risk per protocol (document High fall risk)  Age 79  Fall History: Fall within 6 months prior to admission 0  Elimination; Bowel and/or Urine Incontinence 2  Elimination; Bowel and/or Urine Urgency/Frequency 2  Medications: includes PCA/Opiates, Anti-convulsants, Anti-hypertensives, Diuretics, Hypnotics, Laxatives, Sedatives, and Psychotropics 3  Patient Care Equipment 2  Mobility-Assistance 2  Mobility-Gait 2  Mobility-Sensory Deficit 0  Altered awareness of immediate physical environment 1  Impulsiveness 2  Lack of understanding of one's physical/cognitive limitations 4  Total Score 22  Patient Fall Risk Level High fall risk  Adult Fall Risk Interventions  Required Bundle Interventions *See Row Information* High fall risk - low, moderate, and high requirements implemented  Additional Interventions Use of appropriate toileting equipment (bedpan, BSC, etc.);Room near nurses station (low bed and mats)  Screening for Fall Injury Risk (To be completed on HIGH fall risk patients) - Assessing Need for Low Bed  Risk For Fall Injury- Low Bed Criteria None identified - Continue screening  Screening for Fall Injury Risk (To be completed on HIGH fall risk patients who do not meet crieteria for Low Bed) - Assessing Need for Floor Mats Only  Risk For Fall Injury- Criteria for Floor Mats Confusion/dementia (+NuDESC, CIWA, TBI, etc.)  Will Implement Floor Mats Yes  Vitals  BP  95/80  MAP (mmHg) 87  BP Method Automatic  Pulse Rate 83  Oxygen Therapy  SpO2 98 %  Pain Assessment  Pain Scale 0-10  Pain Score 0

## 2020-05-02 NOTE — Progress Notes (Signed)
PROGRESS NOTE Kristin Grimes  ZOX:[id number] DOB: 27-Jul-1941 DOA: 04/15/2020 PCP: Kristin Grimes, No Pcp Per  Chef Complaints: Agitation Brief Narrative: 79 y.o.advanced dementia for the past 15 yrs per husband esophageal surgery unspecified moved from [place] with her husband presented to hospital with worsening mental status for the last 4 weeks since the move.  Family was concerned that it was a psychotic issue and the Kristin Grimes has been refusing to take p.o. with prior history of esophageal surgery.   There is some question about the syncopal event over the past few weeks.  Kristin Grimes was brought into the hospital because of worsening mental status and combativeness with family at home.   In the ED, Kristin Grimes was evaluated. Routine lab work was obtained + markedly combative requiring 2 mg of IV Haldol with minimal to no improvement given 40 mg of Geodon p.o. with moderate improvement but ultimately decision was made for one-to-one sitter to be at bedside. CT head given mental status changes and chest x-ray with no overt findings.   lab work unimpressive.   EXCEPT TSH elevated at 82.5 with low free T4 low at 0.54 ( nl 0.61-1.12) FT3 0.9 + AKI Prior esophagogram that showed pevious fundoplication, diverticulum  On 6/11 Husband reports they moved from [place] 4-week ago 1 week before admission Kristin Grimes has been increasingly restless agitated not sleeping and has not been eating which prompted to the ED visit as husband was unable to take care of her.  In [place] Kristin Grimes was displaying short-term memory loss with frequent forgetting asking questions about was dressing herself able to feed herself going out for dinner and ambulating.  Subjective:  Some agitation today Reordered telemetry sitter because of attempts to get out of bed overnight Family with many questions about disposition and I have updated them as best as I can Kristin Grimes herself refuses to open her eyes but historically has been  improved since prior   Assessment & Plan:  Advanced dementia with behavioral issues/agitation: Per husband Kristin Grimes had short-term memory issues but was able to feed herself prior to moving from [place] 4 weeks ago and for 1 week prior to admission Kristin Grimes has been extremely agitated not sleeping.   ? Delirium /triggered her decline could be changing her location versus apathetic geriatric hypothyroidism causing mental decline Dr. Amada Jupiter consulted- CT head no acute finding LFTs stable no obvious infection D/w Dr. Otelia Limes on 6/14 reviewed the case and felt that with improvements especially with negative MRI EEG this is primarily metabolic - calmed down significantly  Re-consulted psychiatry 6/10 medication adjustments made-somnolence improved reconsulting 6/14 and 6/15  Current meds include Lexapro 20, Zyprexa 2.5 bid--I have spoken in detail with the daughter as well as the husband on 6/21 given concerns for increasing agitation and explained to them that it is difficult to balance somnolence versus agitation and that if I increased her meds we will be right back to her being somnolent and noninteractive with therapy services and staff-I am happy to call psychiatry back however I do not think it is a good idea to increase meds at this time   [facility] and has no bed offers at this time  Case manager to reach out him discuss planning with family   Dehydration/ketosis 2/2 poor p.o. 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  Husband did not wish to pursue on further invasive work-up for this.   Tolerating some diet according to records  History of gaseous distention/dyspepsia continue Protonix, simethicone and MiraLAX.  Constipated for week per husband  ?Syncopal event: Difficult to ascertain question orthostatics given Kristin Grimes's poor intake and improving  Mild AKI with poor oral  intake-resovled Saline lock iv today 6/19 Creatinine stable on recent labs  Hypertension: On nifedipine lisinopril and Coreg at home-holding lisinopril and nifedipine.  Continue Coreg with holding parameters.    Hypothyroidism apathetic hypothyroidism  TSH elevated  free T4 on lower side likely from noncompliance. TSH on admit 82.5 with low free T4 low at 0.54.   Repeat TSH 44  Received 50 mcg IV daily for 4 doses in addition to 112 orally We will discontinue the IV Synthroid on 6/17 and continue her home dose of 112 p.o. Recheck TSH in 1 week on 6/25  Eye irritation:continue  eye drop.  DVT prophylaxis:lovenox Code Status: FULL Family Communication: plan of care discussed with Kristin Grimes's husband 6/21 spoek with daughter 6/21 on phone No family + today  Status is: Inpatient Remains inpatient appropriate because:Awaiting bed at [place]: Kristin Grimes From: Home  Planned Disposition: To be determined [facility] will be needed on discharge per psychiatry saw the Kristin Grimes  Expected discharge date: Hopefully the next several days  Medically stable for discharge: No  Diet Order            DIET - DYS 1 Room service appropriate? Yes; Fluid consistency: Thin  Diet effective now                 Body mass index is 30.55 kg/m. Consultants: Psychiatry, neurology Procedures:see note Microbiology:see note  Medications: Scheduled Meds: . acetaminophen  650 mg Oral Q8H  . carvedilol  3.125 mg Oral BID WC  . enoxaparin (LOVENOX) injection  40 mg Subcutaneous Q24H  . escitalopram  20 mg Oral Daily  . levothyroxine  112 mcg Oral QAC breakfast  . OLANZapine  2.5 mg Oral BID  . pantoprazole  40 mg Oral Daily  . polyethylene glycol  17 g Oral Daily  . polyvinyl alcohol  1 drop Both Eyes BID  . simethicone  80 mg Oral QID   Continuous Infusions: . lactated ringers 40 mL/hr at 04/29/20 2200   Antimicrobials: Anti-infectives (From admission, onward)   None     Objective: Vitals: Today's Vitals   05/02/20 0026 05/02/20 0645 05/02/20 0646 05/02/20 0843  BP:  (!) 148/97    Pulse:  76    Resp:  18    Temp:  97.9 F (36.6 C)    TempSrc:  Oral    SpO2:  95%    Weight:      Height:      PainSc: Asleep  Asleep 0-No pain    Intake/Output Summary (Last 24 hours) at 05/02/2020 1443 Last data filed at 05/02/2020 0245 Gross per 24 hour  Intake 1108.72 ml  Output --  Net 1108.72 ml   Filed Weights   04/16/20 0116 04/19/20 1255  Weight: 82.1 kg 83.3 kg   Weight change:    Intake/Output from previous day: 06/20 0701 - 06/21 0700 In: 1588.7 [P.O.:840; I.V.:748.7] Out: -  Intake/Output this shift: No intake/output data recorded.  Examination:  Awake but refuses to open eyes Chest clear S1-S2 no murmur rub or gallop Abdomen soft Has a pure wick in Did not examine sacrum today   Data Reviewed: I have personally reviewed following labs and imaging studies CBC: Recent Labs  Lab 04/27/20 0628 04/29/20 0910 05/02/20 0547  WBC 8.0  7.4 7.3  NEUTROABS 5.6 4.9 3.7  HGB 13.7 13.2 12.7  HCT 42.4 40.3 39.5  MCV 96.8 97.3 96.6  PLT 243 224 260   Basic Metabolic Panel: Recent Labs  Lab 04/26/20 0552 04/27/20 0628 04/28/20 0821 04/29/20 0910 05/02/20 0547  NA 137 138 138 138 135  K 4.5 4.5 4.5 4.4 3.9  CL 102 103 105 105 101  CO2 27 26 27 26 24   GLUCOSE 96 110* 97 105* 104*  BUN 16 10 10 10 10   CREATININE 1.22* 1.00 1.33* 1.17* 1.13*  CALCIUM 9.4 9.6 9.2 9.6 9.1  45 GFR: Estimated Creatinine Clearance: 44.4 mL/min (A) (by C-G formula based on SCr of 1.13 mg/dL (H)). Liver Function Tests: Recent Labs  Lab 04/27/20 0628 04/28/20 0821 05/02/20 0547  AST 33 28 25  ALT 24 23 27   ALKPHOS 59 66 76  BILITOT 0.9 0.7 0.4  PROT 6.2* 5.1* 6.1*  ALBUMIN 3.2* 2.8* 3.2*   No results for input(s): LIPASE, AMYLASE in the last 168 hours. No results for input(s): AMMONIA in the last 168 hours. Coagulation Profile: No results for  input(s): INR, PROTIME in the last 168 hours. Cardiac Enzymes: No results for input(s): CKTOTAL, CKMB, CKMBINDEX, TROPONINI in the last 168 hours. BNP (last 3 results) No results for input(s): PROBNP in the last 8760 hours. HbA1C: No results for input(s): HGBA1C in the last 72 hours. CBG: No results for input(s): GLUCAP in the last 168 hours. Lipid Profile: No results for input(s): CHOL, HDL, LDLCALC, TRIG, CHOLHDL, LDLDIRECT in the last 72 hours. Thyroid Function Tests: No results for input(s): TSH, T4TOTAL, FREET4, T3FREE, THYROIDAB in the last 72 hours. Anemia Panel: No results for input(s): VITAMINB12, FOLATE, FERRITIN, TIBC, IRON, RETICCTPCT in the last 72 hours. Sepsis Labs: No results for input(s): PROCALCITON, LATICACIDVEN in the last 168 hours.  No results found for this or any previous visit (from the past 240 hour(s)).    Radiology Studies: No results found.   LOS: 17 days   04/30/20, MD [facility]  15 minutes  05/02/2020, 2:43 PM

## 2020-05-03 MED ORDER — LORAZEPAM 2 MG/ML IJ SOLN
0.5000 mg | Freq: Once | INTRAMUSCULAR | Status: AC
  Administered 2020-05-03: 0.5 mg via INTRAVENOUS
  Filled 2020-05-03: qty 1

## 2020-05-03 MED ORDER — QUETIAPINE FUMARATE 50 MG PO TABS
50.0000 mg | ORAL_TABLET | Freq: Every day | ORAL | Status: DC
  Administered 2020-05-04 – 2020-05-08 (×6): 50 mg via ORAL
  Filled 2020-05-03 (×6): qty 1

## 2020-05-03 MED ORDER — QUETIAPINE FUMARATE 25 MG PO TABS
25.0000 mg | ORAL_TABLET | Freq: Every day | ORAL | Status: DC
  Administered 2020-05-04 – 2020-05-08 (×5): 25 mg via ORAL
  Filled 2020-05-03 (×6): qty 1

## 2020-05-03 MED ORDER — QUETIAPINE FUMARATE 25 MG PO TABS: 25.0000 mg | ORAL_TABLET | Freq: Every day | ORAL | Status: DC

## 2020-05-03 NOTE — Progress Notes (Signed)
PROGRESS NOTE Kristin Grimes  BOF:[id number] DOB: April 25, 1941 DOA: 04/15/2020 PCP: Patient, No Pcp Per  Chef Complaints: Agitation Brief Narrative: 79 y.o.advanced dementia for the past 15 yrs per husband esophageal surgery unspecified moved from [place] with her husband presented to hospital with worsening mental status for the last 4 weeks since the move.  Family was concerned that it was a psychotic issue and the patient has been refusing to take p.o. with prior history of esophageal surgery.   There is some question about the syncopal event over the past few weeks.  Patient was brought into the hospital because of worsening mental status and combativeness with family at home.   In the ED, patient was evaluated. Routine lab work was obtained + markedly combative requiring 2 mg of IV Haldol with minimal to no improvement given 40 mg of Geodon p.o. with moderate improvement but ultimately decision was made for one-to-one sitter to be at bedside. CT head given mental status changes and chest x-ray with no overt findings.   lab work unimpressive.   EXCEPT TSH elevated at 82.5 with low free T4 low at 0.54 ( nl 0.61-1.12) FT3 0.9 + AKI Prior esophagogram that showed pevious fundoplication, diverticulum  On 6/11 Husband reports they moved from [place] 4-week ago 1 week before admission patient has been increasingly restless agitated not sleeping and has not been eating which prompted to the ED visit as husband was unable to take care of her.  In [place] patient was displaying short-term memory loss with frequent forgetting asking questions about was dressing herself able to feed herself going out for dinner and ambulating.  Subjective:  Less agitated opens her eyes to some degree No chest pain no fever Seems to have eaten some of her tray No family at bedside   Assessment & Plan:  Advanced dementia with behavioral issues/agitation: Per husband patient had short-term memory  issues but was able to feed herself prior to moving from [place] 4 weeks ago and for 1 week prior to admission patient has been extremely agitated not sleeping.   ? Delirium /triggered her decline could be changing her location versus apathetic geriatric hypothyroidism causing mental decline Dr. Amada Jupiter consulted- CT head no acute finding LFTs stable no obvious infection D/w Dr. Otelia Limes on 6/14 reviewed the case and felt that with improvements especially with negative MRI EEG this is primarily metabolic - calmed down significantly  Re-consulted psychiatry 6/10 medication adjustments made-somnolence improved reconsulting 6/14 and 6/15  Discussed personally with Dr. Lucianne Muss of psychiatry today and she recommends not using Zydis and changing to Seroquel and keeping the Lexapro dose the same  Patient no longer requires a Geri psych unit and may be able to go to SNF with palliative following  Case manager to reach out him discuss planning with family   Dehydration/ketosis 2/2 poor p.o. 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  Husband did not wish to pursue on further invasive work-up for this.   Tolerating some diet according to records  History of gaseous distention/dyspepsia continue Protonix, simethicone and MiraLAX.  Constipated for week per husband  ?Syncopal event: Difficult to ascertain question orthostatics given patient's poor intake and improving -Has significantly improved Mild AKI with poor oral intake-resovled Saline lock iv today 6/19 Creatinine stable on recent labs  Hypertension: On nifedipine lisinopril and Coreg at home-holding lisinopril and nifedipine.  Continue Coreg with holding parameters.    Hypothyroidism apathetic hypothyroidism  TSH elevated  free T4 on lower side likely from noncompliance. TSH on admit 82.5 with low free T4 low at 0.54.   Repeat TSH 44  Received 50 mcg IV  daily for 4 doses in addition to 112 orally We will discontinue the IV Synthroid on 6/17 and continue her home dose of 112 p.o. Recheck TSH in 1 week on 6/25  Eye irritation:continue  eye drop.  DVT prophylaxis:lovenox Code Status: FULL Family Communication: plan of care discussed with patient's husband 6/21 spoek with daughter 6/21 on phone No family + today  Status is: Inpatient Remains inpatient appropriate because:needs SNF or ALF plcement  Dispo: Patient From:    Planned Disposition: To be determined   Expected discharge date: Hopefully the next several days??  Medically stable for discharge: No  Diet Order            DIET - DYS 1 Room service appropriate? Yes; Fluid consistency: Thin  Diet effective now                 Body mass index is 30.55 kg/m. Consultants: Psychiatry, neurology Procedures:see note Microbiology:see note  Medications: Scheduled Meds: . acetaminophen  650 mg Oral Q8H  . carvedilol  3.125 mg Oral BID WC  . enoxaparin (LOVENOX) injection  40 mg Subcutaneous Q24H  . escitalopram  20 mg Oral Daily  . levothyroxine  112 mcg Oral QAC breakfast  . pantoprazole  40 mg Oral Daily  . polyethylene glycol  17 g Oral Daily  . polyvinyl alcohol  1 drop Both Eyes BID  . QUEtiapine  25 mg Oral QHS  . QUEtiapine  50 mg Oral QHS  . simethicone  80 mg Oral QID   Continuous Infusions:  Antimicrobials: Anti-infectives (From admission, onward)   None    Objective: Vitals: Today's Vitals   05/03/20 0639 05/03/20 0800 05/03/20 0835 05/03/20 1342  BP: (!) 158/131  (!) 179/85 (!) 98/49  Pulse: 74  81 69  Resp: 17  18 15   Temp: 98.3 F (36.8 C)  98.9 F (37.2 C) 98.6 F (37 C)  TempSrc: Oral  Oral Oral  SpO2: 95%  94% 92%  Weight:      Height:      PainSc:  0-No pain      Intake/Output Summary (Last 24 hours) at 05/03/2020 1822 Last data filed at 05/03/2020 1257 Gross per 24 hour  Intake 1020 ml  Output 0 ml  Net 1020 ml   Filed Weights    04/16/20 0116 04/19/20 1255  Weight: 82.1 kg 83.3 kg   Weight change:    Intake/Output from previous day: 06/21 0701 - 06/22 0700 In: 60 [P.O.:60] Out: 0  Intake/Output this shift: Total I/O In: 960 [P.O.:960] Out: -   Examination:  Eyes open some Chest clear S1-S2 no murmur rub or gallop Abdomen soft Has a pure wick in Sacrum no decubiti   Data Reviewed: I have personally reviewed following labs and imaging studies CBC: Recent Labs  Lab 04/27/20 0628 04/29/20 0910 05/02/20 0547  WBC 8.0 7.4 7.3  NEUTROABS 5.6 4.9 3.7  HGB 13.7 13.2 12.7  HCT 42.4 40.3 39.5  MCV 96.8 97.3 96.6  PLT 243 224 260   Basic Metabolic Panel: Recent Labs  Lab 04/27/20 0628 04/28/20 0821 04/29/20 0910 05/02/20 0547  NA 138 138 138 135  K 4.5 4.5 4.4 3.9  CL 103 105 105 101  CO2 26 27 26 24   GLUCOSE 110* 97 105* 104*  BUN  10 10 10 10   CREATININE 1.00 1.33* 1.17* 1.13*  CALCIUM 9.6 9.2 9.6 9.1  45 GFR: Estimated Creatinine Clearance: 44.4 mL/min (A) (by C-G formula based on SCr of 1.13 mg/dL (H)). Liver Function Tests: Recent Labs  Lab 04/27/20 0628 04/28/20 0821 05/02/20 0547  AST 33 28 25  ALT 24 23 27   ALKPHOS 59 66 76  BILITOT 0.9 0.7 0.4  PROT 6.2* 5.1* 6.1*  ALBUMIN 3.2* 2.8* 3.2*   No results for input(s): LIPASE, AMYLASE in the last 168 hours. No results for input(s): AMMONIA in the last 168 hours. Coagulation Profile: No results for input(s): INR, PROTIME in the last 168 hours. Cardiac Enzymes: No results for input(s): CKTOTAL, CKMB, CKMBINDEX, TROPONINI in the last 168 hours. BNP (last 3 results) No results for input(s): PROBNP in the last 8760 hours. HbA1C: No results for input(s): HGBA1C in the last 72 hours. CBG: No results for input(s): GLUCAP in the last 168 hours. Lipid Profile: No results for input(s): CHOL, HDL, LDLCALC, TRIG, CHOLHDL, LDLDIRECT in the last 72 hours. Thyroid Function Tests: No results for input(s): TSH, T4TOTAL, FREET4,  T3FREE, THYROIDAB in the last 72 hours. Anemia Panel: No results for input(s): VITAMINB12, FOLATE, FERRITIN, TIBC, IRON, RETICCTPCT in the last 72 hours. Sepsis Labs: No results for input(s): PROCALCITON, LATICACIDVEN in the last 168 hours.  No results found for this or any previous visit (from the past 240 hour(s)).    Radiology Studies: No results found.   LOS: 18 days   Nita Sells, MD [facility]  15 minutes  05/03/2020, 6:22 PM

## 2020-05-03 NOTE — Progress Notes (Signed)
OT Cancellation Note  Patient Details Name: Kristin Grimes MRN: [id number] DOB: 1941/06/10   Cancelled Treatment:    Reason Eval/Treat Not Completed: Other (comment) (RN requests therapist to not disturb patient today as she had just fallen asleep. Will f/u with patient as able.)  Kelli Churn 05/03/2020, 4:27 PM

## 2020-05-03 NOTE — Consult Note (Signed)
Case discussed with Dr. Lucianne Muss.  Psychiatry has been rounding on patient for advanced dementia, new onset of restlessness, and agitation.  Medication recommendations have been made to include discontinuation of Depakote as her recent level was 115 April 27, 2020.  Level to be rechecked new order placed, as as we previously recommended checking levels on April 29, 2020.  Patient did well on Zyprexa and has been switched to Seroquel 25 mg every morning and Seroquel 50 mg p.o. nightly.  Patient with longstanding advanced dementia, with progression of the disease who was found to be combative at home.   Patient may benefit from skilled nursing rehab or assisted living facility. Unable to assess suicidality at this time. Will psych clear at this time, it is encouraged that we continue current medications and safety measures. SHe may experience some confusion, agitation, and restlessness as well as [employer] this is completely normal behavior in a person of this age. It is felt that patient may need higher level of care due to worsening aggression, confusion, agitation, and restlessness. Patient behaviors are consistent with a person with dementia and other memory loss. SNF,  ALF, Home Health unit would be appropriate as they are well equipped to help elderly persons with memory loss, maintain cognitive skills and help with quality of life.

## 2020-05-04 DIAGNOSIS — N1831 Chronic kidney disease, stage 3a: Secondary | ICD-10-CM

## 2020-05-04 LAB — VALPROIC ACID LEVEL: Valproic Acid Lvl: <10 ug/mL — ABNORMAL LOW (ref 50.0–100.0)

## 2020-05-04 MED ORDER — LISINOPRIL 20 MG PO TABS
40.0000 mg | ORAL_TABLET | Freq: Every day | ORAL | Status: DC
  Administered 2020-05-04 – 2020-05-08 (×4): 40 mg via ORAL
  Filled 2020-05-04 (×6): qty 2

## 2020-05-04 NOTE — Progress Notes (Signed)
Occupational Therapy Treatment Patient Details Name: Kristin Grimes MRN: [id number] DOB: 01-Jun-1941 Today's Date: 05/04/2020    History of present illness Pt is 79 yo female admitted with AMS, FTT. Family brought pt to ED-husband unable to care for pt due to dementia with behavioral disturbances.  Pt currently being managed with medications and has had varied behaviors.Marland Kitchen Hx of dementia, scoliosis, esophageal hernia.   OT comments  Patient continues to have significant altered mental status and agitation limiting participation in mobility and ADLs. Patient able to tolerate sitting edge of bed, transfer to [facility], grooming and feeding tasks with assistance of two therapists providing multimodal cues for participation, transfers and safety. Persistent complaints of back pain throughout. Continue to recommend short term rehab to maximize patient's potential in order to reduce caregiver load.   Follow Up Recommendations  SNF    Equipment Recommendations       Recommendations for Other Services      Precautions / Restrictions Precautions Precautions: Fall Precaution Comments: level of cooperation/agitation varies Restrictions Weight Bearing Restrictions: No Other Position/Activity Restrictions: Has back pain, bilateral knee pain       Mobility Bed Mobility Overal bed mobility: Needs Assistance Bed Mobility: Supine to Sit;Sit to Supine Rolling: +2 for physical assistance;+2 for safety/equipment;Mod assist   Supine to sit: Mod assist;+2 for physical assistance Sit to supine: Mod assist;+2 for physical assistance   General bed mobility comments: Required mod A x 2 to scoot toward EOB and lift trunk.  Limited participation with this transfer. Expressed fear of falling  Transfers Overall transfer level: Needs assistance Equipment used: Rolling walker (2 wheeled);2 person hand held assist Transfers: Sit to/from [facility] Sit to Stand: Min assist;Mod assist;+2  physical assistance Stand pivot transfers: Min assist;Mod assist;+2 physical assistance       General transfer comment: Performed sit to stand x 1 with RW and x 2 with HHA of 2.  Pt requiring mod A to initiate but then min A of 2 to complete sit to stands.  Provided facilitation with gait belt and bed pad under buttock.  Pt performed stand pivot to bsc and back to bed - again with facilitation for posture and heavy multimodal cues for transfer    Balance Overall balance assessment: Needs assistance Sitting-balance support: Feet supported;Single extremity supported Sitting balance-Leahy Scale: Poor Sitting balance - Comments: Pt leaning L onto L elbow or therapist.  Had difficulty sitting upright even with assist.  Reports fear of falling and not feeling well.  Sat EOB for at least 8 mins. Postural control: Posterior lean;Right lateral lean Standing balance support: Bilateral upper extremity supported;During functional activity Standing balance-Leahy Scale: Poor Standing balance comment: Pt with forward lean and required HHA of 2; able to stand for transfers to/from bed; limited by fear of falling/cognition                           ADL either performed or assessed with clinical judgement   ADL   Eating/Feeding: Maximal assistance;Set up;Cueing for sequencing Eating/Feeding Details (indicate cue type and reason): Therapist assisted patient with feeding. Provided therapeutic strategies using multimodal cues for patient to follow commands for feeding. Attempted once for patinet to feed herself - liimited by patient not opening her eyes and tremor in right hand. Grooming: Set up;Cueing for sequencing;Wash/dry face Grooming Details (indicate cue type and reason): Patient washed face sitting on BSC.  Toilet Transfer: +2 for physical assistance;BSC;Stand-pivot Toilet Transfer Details (indicate cue type and reason): Transferred patient to [facility] to attempt  norma//functional toileting. Toileting- Clothing Manipulation and Hygiene: Total assistance;+2 for physical assistance;Sit to/from stand       Functional mobility during ADLs: +2 for physical assistance General ADL Comments: Patient very fearful of falling, crying with anxiety with transfer and standing. +2 for physical assistance to stand pivot - patient continues to not open her eyes.     Vision   Additional Comments: Continues to not open her eyes for any reason despite encouragement and cues.   Perception     Praxis      Cognition Arousal/Alertness: Awake/alert Behavior During Therapy: Restless Overall Cognitive Status: Impaired/Different from baseline                                 General Comments: Pt oriented to self only.  She was pleasant but very confused.  Following ~25% of simple commands with multimodal cues.        Exercises     Shoulder Instructions       General Comments VSS    Pertinent Vitals/ Pain       Pain Assessment: Faces Faces Pain Scale: Hurts even more Pain Location: Back - chronic, Pain Descriptors / Indicators: Grimacing;Restless;Guarding Pain Intervention(s): Limited activity within patient's tolerance;Monitored during session (notified RN)  Home Living                                          Prior Functioning/Environment              Frequency  Min 2X/week        Progress Toward Goals  OT Goals(current goals can now be found in the care plan section)  Progress towards OT goals: Not progressing toward goals - comment (limited by changing mental status and medication changes)  Acute Rehab OT Goals Patient Stated Goal: decrease pain OT Goal Formulation: Patient unable to participate in goal setting Time For Goal Achievement: 05/12/20 Potential to Achieve Goals: Fair  Plan Discharge plan remains appropriate    Co-evaluation    PT/OT/SLP Co-Evaluation/Treatment: Yes Reason for  Co-Treatment: For patient/therapist safety;Necessary to address cognition/behavior during functional activity PT goals addressed during session: Mobility/safety with mobility OT goals addressed during session: ADL's and self-care      AM-PAC OT "6 Clicks" Daily Activity     Outcome Measure   Help from another person eating meals?: A Lot Help from another person taking care of personal grooming?: A Little Help from another person toileting, which includes using toliet, bedpan, or urinal?: Total Help from another person bathing (including washing, rinsing, drying)?: Total Help from another person to put on and taking off regular upper body clothing?: Total Help from another person to put on and taking off regular lower body clothing?: Total 6 Click Score: 9    End of Session Equipment Utilized During Treatment: Gait belt  OT Visit Diagnosis: Unsteadiness on feet (R26.81)   Activity Tolerance Patient limited by pain (Constant complaints of back pain.)   Patient Left in bed;with call bell/phone within reach;with bed alarm set;with family/visitor present   Nurse Communication Mobility status (informed patient of food patient ate.)        Time: 3903-0092 OT Time Calculation (min): 33 min  Charges: OT  General Charges $OT Visit: 1 Visit OT Treatments $Self Care/Home Management : 23-37 mins  Sheyann Sulton, OTR/L Acute Care Rehab Services  Office [phone] Pager: [phone]    Kelli Churn 05/04/2020, 1:36 PM

## 2020-05-04 NOTE — Progress Notes (Signed)
Physical Therapy Treatment Patient Details Name: Kristin Grimes MRN: [id number] DOB: 1941/05/12 Today's Date: 05/04/2020    History of Present Illness Pt is 79 yo female admitted with AMS, FTT. Family brought pt to ED-husband unable to care for pt due to dementia with behavioral disturbances.  Pt currently being managed with medications and has had varied behaviors.Marland Kitchen Hx of dementia, scoliosis, esophageal hernia.    PT Comments    Pt continues to be limited by cognition, fear of falling, and willingness to participate.  She was able to participate with transfers and taking a few steps with max multimodal cues and facilitation.  Once standing pt is able to support her weight and needs min-mod A of 2 for safety, facilitation, and weight shifting for steps.  Pt fatigues easily and expresses chronic knee and back pain.  Cont POC as able.     Follow Up Recommendations  SNF;Supervision/Assistance - 24 hour     Equipment Recommendations  None recommended by PT    Recommendations for Other Services       Precautions / Restrictions Precautions Precautions: Fall Precaution Comments: level of cooperation/agitation varies    Mobility  Bed Mobility Overal bed mobility: Needs Assistance Bed Mobility: Supine to Sit;Sit to Supine     Supine to sit: Mod assist;+2 for physical assistance Sit to supine: Mod assist;+2 for physical assistance   General bed mobility comments: Required mod A x 2 to scoot toward EOB and lift trunk.  Limited participation with this transfer. Expressed fear of falling  Transfers Overall transfer level: Needs assistance Equipment used: Rolling walker (2 wheeled);2 person hand held assist Transfers: Sit to/from [employer] Sit to Stand: Min assist;Mod assist;+2 physical assistance Stand pivot transfers: Min assist;Mod assist;+2 physical assistance       General transfer comment: Performed sit to stand x 1 with RW and x 2 with HHA of 2.  Pt  requiring mod A to initiate but then min A of 2 to complete sit to stands.  Provided facilitation with gait belt and bed pad under buttock.  Pt performed stand pivot to bsc and back to bed - again with facilitation for posture and heavy multimodal cues for transfer  Ambulation/Gait Ambulation/Gait assistance: +2 physical assistance;Min assist Gait Distance (Feet): 3 Feet Assistive device: 2 person hand held assist Gait Pattern/deviations: Wide base of support;Decreased stride length;Trunk flexed Gait velocity: decreased   General Gait Details: Pt took steps from bsc back to bed with min A of 2 for steadying, facilitation for weight shift and posture, pt with forward trunk and genu valgus noted   Stairs             Wheelchair Mobility    Modified Rankin (Stroke Patients Only)       Balance Overall balance assessment: Needs assistance Sitting-balance support: Feet supported;Single extremity supported Sitting balance-Leahy Scale: Poor Sitting balance - Comments: Pt leaning L onto L elbow or therapist.  Had difficulty sitting upright even with assist.  Reports fear of falling and not feeling well.  Sat EOB for at least 8 mins.   Standing balance support: Bilateral upper extremity supported;During functional activity Standing balance-Leahy Scale: Poor Standing balance comment: Pt with forward lean and required HHA of 2; able to stand for transfers to/from bed; limited by fear of falling/cognition                            Cognition Arousal/Alertness: Awake/alert (awake but keeps eyes  closed) Behavior During Therapy: Restless Overall Cognitive Status: No family/caregiver present to determine baseline cognitive functioning                                 General Comments: Pt oriented to self only.  She was pleasant but very confused.  Following ~25% of simple commands with multimodal cues.      Exercises      General Comments General comments  (skin integrity, edema, etc.): VSS      Pertinent Vitals/Pain Pain Assessment: Faces Faces Pain Scale: Hurts little more Pain Location: Back - chronic Pain Intervention(s): Limited activity within patient's tolerance;Other (comment);Monitored during session (notified RN pt may need meds)    Home Living                      Prior Function            PT Goals (current goals can now be found in the care plan section) Acute Rehab PT Goals Patient Stated Goal: decrease pain PT Goal Formulation: Patient unable to participate in goal setting Time For Goal Achievement: 05/18/20 Potential to Achieve Goals: Fair Progress towards PT goals: Goals downgraded-see care plan    Frequency    Min 2X/week      PT Plan Current plan remains appropriate    Co-evaluation PT/OT/SLP Co-Evaluation/Treatment: Yes Reason for Co-Treatment: For patient/therapist safety;Necessary to address cognition/behavior during functional activity PT goals addressed during session: Mobility/safety with mobility OT goals addressed during session: ADL's and self-care      AM-PAC PT "6 Clicks" Mobility   Outcome Measure  Help needed turning from your back to your side while in a flat bed without using bedrails?: A Lot Help needed moving from lying on your back to sitting on the side of a flat bed without using bedrails?: A Lot Help needed moving to and from a bed to a chair (including a wheelchair)?: A Lot Help needed standing up from a chair using your arms (e.g., wheelchair or bedside chair)?: A Lot Help needed to walk in hospital room?: A Lot Help needed climbing 3-5 steps with a railing? : Total 6 Click Score: 11    End of Session Equipment Utilized During Treatment: Gait belt Activity Tolerance: Other (comment) (participated but some limitations due to cognition and fear of falling) Patient left: in bed;with bed alarm set;with call bell/phone within reach Nurse Communication: Mobility  status PT Visit Diagnosis: Muscle weakness (generalized) (M62.81);Adult, failure to thrive (R62.7);Difficulty in walking, not elsewhere classified (R26.2)     Time: 2025-4270 PT Time Calculation (min) (ACUTE ONLY): 20 min  Charges:  $Therapeutic Activity: 8-22 mins                     Abran Richard, PT Acute Rehab Services Pager [phone] [facility] 05/04/2020, 12:12 PM

## 2020-05-04 NOTE — Progress Notes (Addendum)
PROGRESS NOTE    Venissa Nappi  XKG:[id number] DOB: 20-Dec-1940 DOA: 04/15/2020 PCP: Patient, No Pcp Per   Brief Narrative: Lanisa Ishler is a 79 y.o. female with a history of dementia. Patient presented secondary to mental status change and combativeness in setting of dementia. Patient evaluated by psychiatry with initial recommendations for inpatient behavioral admission. Medications adjusted and now recommendations for SNF/memory care units discharge.   Assessment & Plan:   Principal Problem:   Advanced dementia ([place]) Active Problems:   Altered mental status   Failure to thrive in adult   Dysphagia   Dementia with behavioral disturbance ([place])   Esophageal hernia   Palliative care encounter   DNR (do not resuscitate)   Advanced dementia with behavioral disturbance Patient with agitation issues initially. Psychiatry was consulted and initially recommended geriatric inpatient psychiatry admission. Patient was managed on Zydis with improvement to agitation but increased somnolence. Regimen switched to Seroquel. Recommendations to continue Lexapro. Updated recommendations for no inpatient psychiatry admission. -Continue Lexapro and Seroquel  Dehydration Secondary to poor oral intake. History of Zinker's diverticulum with previous fundoplication. Per chart review, family did not wish for further management. -Diet as tolerated  Possibly syncopal event Possibly orthostatic related.  CKD stage III Baseline creatinine of 1.2. Does not appear to meet criteria for AKI.  Essential hypertension Uncontrolled. -Continue Coreg and restart home lisinopril  Hypothyroidism Patient with a recent TSH of 82.5 on 04/17/2020. TSH now trended down to 24.9 -Continue Synthroid 112 mcg daily  Eye irritation Eye drops prescribed -Continue eye drops   DVT prophylaxis: Lovenox Code Status:   Code Status: DNR Family Communication: None at bedside Disposition Plan: Discharge to  SNF/memory care unit when bed available.   Consultants:   Psychiatry  Palliative care medicine  Procedures:   None  Antimicrobials:  None    Subjective: Patient not able to participate secondary to confusion.  Objective: Vitals:   05/03/20 0835 05/03/20 1342 05/03/20 2213 05/04/20 0535  BP: (!) 179/85 (!) 98/49 (!) 161/123 (!) 154/122  Pulse: 81 69 73 68  Resp: 18 15 20 18   Temp: 98.9 F (37.2 C) 98.6 F (37 C) 98.4 F (36.9 C) (!) 97.5 F (36.4 C)  TempSrc: Oral Oral Oral Oral  SpO2: 94% 92% 95% 92%  Weight:      Height:        Intake/Output Summary (Last 24 hours) at 05/04/2020 1121 Last data filed at 05/04/2020 0011 Gross per 24 hour  Intake 900 ml  Output --  Net 900 ml   Filed Weights   04/16/20 0116 04/19/20 1255  Weight: 82.1 kg 83.3 kg    Examination:  General exam: Appears agitated Respiratory system: Clear to auscultation. Respiratory effort normal. Cardiovascular system: S1 & S2 heard, RRR. No murmurs, rubs, gallops or clicks. Gastrointestinal system: Abdomen is nondistended, soft and nontender. No organomegaly or masses felt. Normal bowel sounds heard. Central nervous system: Alert and oriented to person. Musculoskeletal: No edema. No calf tenderness Skin: No cyanosis. No rashes Psychiatry: Judgement and insight appear impaired. Anxious. Speaking to herself.    Data Reviewed: I have personally reviewed following labs and imaging studies  CBC Lab Results  Component Value Date   WBC 7.3 05/02/2020   RBC 4.09 05/02/2020   HGB 12.7 05/02/2020   HCT 39.5 05/02/2020   MCV 96.6 05/02/2020   MCH 31.1 05/02/2020   PLT 260 05/02/2020   MCHC 32.2 05/02/2020   RDW 14.1 05/02/2020   LYMPHSABS 1.9 05/02/2020  MONOABS 1.4 (H) 05/02/2020   EOSABS 0.2 05/02/2020   BASOSABS 0.1 05/02/2020     Last metabolic panel Lab Results  Component Value Date   NA 135 05/02/2020   K 3.9 05/02/2020   CL 101 05/02/2020   CO2 24 05/02/2020   BUN 10  05/02/2020   CREATININE 1.13 (H) 05/02/2020   GLUCOSE 104 (H) 05/02/2020   GFRNONAA 47 (L) 05/02/2020   GFRAA 54 (L) 05/02/2020   CALCIUM 9.1 05/02/2020   PHOS 3.2 04/21/2020   PROT 6.1 (L) 05/02/2020   ALBUMIN 3.2 (L) 05/02/2020   BILITOT 0.4 05/02/2020   ALKPHOS 76 05/02/2020   AST 25 05/02/2020   ALT 27 05/02/2020   ANIONGAP 10 05/02/2020    CBG (last 3)  No results for input(s): GLUCAP in the last 72 hours.   GFR: Estimated Creatinine Clearance: 44.4 mL/min (A) (by C-G formula based on SCr of 1.13 mg/dL (H)).  Coagulation Profile: No results for input(s): INR, PROTIME in the last 168 hours.  No results found for this or any previous visit (from the past 240 hour(s)).      Radiology Studies: No results found.      Scheduled Meds: . acetaminophen  650 mg Oral Q8H  . carvedilol  3.125 mg Oral BID WC  . enoxaparin (LOVENOX) injection  40 mg Subcutaneous Q24H  . escitalopram  20 mg Oral Daily  . levothyroxine  112 mcg Oral QAC breakfast  . lisinopril  40 mg Oral Daily  . pantoprazole  40 mg Oral Daily  . polyethylene glycol  17 g Oral Daily  . polyvinyl alcohol  1 drop Both Eyes BID  . QUEtiapine  25 mg Oral Daily  . QUEtiapine  50 mg Oral QHS  . simethicone  80 mg Oral QID   Continuous Infusions:   LOS: 19 days     Jacquelin Hawking, MD [facility] 05/04/2020, 11:21 AM  If 7PM-7AM, please contact night-coverage [email or web address]

## 2020-05-04 NOTE — Progress Notes (Signed)
Daily Progress Note   Kristin Grimes Name: Kristin Grimes       Date: 05/04/2020 DOB: 1941/09/29  Age: 79 y.o. MRN#: [id number] Attending Physician: Narda Bonds, MD Primary Care Physician: Kristin Grimes, No Pcp Per Admit Date: 04/15/2020  Reason for Consultation/Follow-up: Establishing goals of care  Subjective:  Kristin Grimes is not alert, she has taken off her gown, she is restless in bed, not able to be redirected, mumbles words I am not able to understand. Has not eaten anything from her breakfast tray yet.   Chart reviewed, discussed with TRH MD, husband and daughter separately on the phone.   See below.   Length of Stay: 19  Current Medications: Scheduled Meds:  . acetaminophen  650 mg Oral Q8H  . carvedilol  3.125 mg Oral BID WC  . enoxaparin (LOVENOX) injection  40 mg Subcutaneous Q24H  . escitalopram  20 mg Oral Daily  . levothyroxine  112 mcg Oral QAC breakfast  . lisinopril  40 mg Oral Daily  . pantoprazole  40 mg Oral Daily  . polyethylene glycol  17 g Oral Daily  . polyvinyl alcohol  1 drop Both Eyes BID  . QUEtiapine  25 mg Oral Daily  . QUEtiapine  50 mg Oral QHS  . simethicone  80 mg Oral QID    Continuous Infusions:   PRN Meds: acetaminophen, bisacodyl, ondansetron, polyvinyl alcohol  Physical Exam         Not alert Restless Some what agitated Regular work of breathing S1 S2 Trace edema Abdomen not distended  Vital Signs: BP (!) 154/122 (BP Location: Right Arm)   Pulse 68   Temp (!) 97.5 F (36.4 C) (Oral)   Resp 18   Ht 5\' 5"  (1.651 m)   Wt 83.3 kg   SpO2 92%   BMI 30.55 kg/m  SpO2: SpO2: 92 % O2 Device: O2 Device: Room Air O2 Flow Rate: O2 Flow Rate (L/min): 0 L/min  Intake/output summary:   Intake/Output Summary (Last 24 hours) at 05/04/2020  1114 Last data filed at 05/04/2020 0011 Gross per 24 hour  Intake 900 ml  Output --  Net 900 ml   LBM: Last BM Date: 05/02/20 Baseline Weight: Weight: 82.1 kg Most recent weight: Weight: 83.3 kg       Palliative Assessment/Data: PPS 20%  Kristin Grimes Active Problem List   Diagnosis Date Noted  . Esophageal hernia   . Palliative care encounter   . DNR (do not resuscitate)   . Dementia with behavioral disturbance ([place]) 04/21/2020  . Advanced dementia ([place]) 04/19/2020  . Altered mental status   . Failure to thrive in adult   . Dysphagia   . Acute metabolic encephalopathy 32/44/0102    Palliative Care Assessment & Plan   Kristin Grimes Profile: 79 year old lady with advanced dementia, has had dementia for the past 15 years, admitted to the hospital with:   Assessment: Advanced dementia with behavioral issues/agitation Dehydration/ketosis secondary to poor p.o. intake History of gaseous distention/dyspepsia Worked up for questionable syncopal event, also had mild acute kidney injury earlier in this hospitalization History of hypertension, hypothyroidism Functional and cognitive decline Minimal to nil oral intake Restlessness/agitation.  Recommendations/Plan: Family meeting with the Kristin Grimes's husband on the phone, separate phone call and family meeting discussions with Kristin Grimes's daughter Kristin Grimes on the phone as well: Palliative services has been following the Kristin Grimes's hospital course and overall disease trajectory.  Chart review has been done thoroughly.  It has been noted from psychiatry standpoint that the Kristin Grimes is no longer deemed to be an appropriate candidate for transfer to [facility] unit.  Transitions of care colleagues have been following for [facility] psych placement for the past few days.  Psychiatric medications continuing to be adjusted, efforts being made to improve Kristin Grimes's functional status as well as oral intake.  As of this morning, Kristin Grimes appears agitated and not  able to be redirected, with minimal to nil oral intake.  This is most concerning for ongoing decline and deterioration in overall disease trajectory from end-stage dementia.  Discussed frankly but compassionately with Kristin Grimes's daughter Kristin Grimes about Kristin Grimes's current condition, recommendations from psychiatry, disposition options.  We discussed about differences between rehab facility with palliative versus memory care unit with palliative.  We discussed about end-of-life signs and symptoms with respect to advanced dementia, we discussed about concepts such as DNR, no artificial feeding with PEG tubes, and the possibility of Kristin Grimes having limited prognosis due to current overall condition.  All of her questions and concerns addressed to the best of my ability.    Code Status:    Code Status Orders  (From admission, onward)         Start     Ordered   04/23/20 1424  Do not attempt resuscitation (DNR)  Continuous       Question Answer Comment  In the event of cardiac or respiratory ARREST Do not call a "code blue"   In the event of cardiac or respiratory ARREST Do not perform Intubation, CPR, defibrillation or ACLS   In the event of cardiac or respiratory ARREST Use medication by any route, position, wound care, and other measures to relive pain and suffering. May use oxygen, suction and manual treatment of airway obstruction as needed for comfort.      04/23/20 1424        Code Status History    Date Active Date Inactive Code Status Order ID Comments User Context   04/15/2020 1508 04/23/2020 1424 Full Code [id number]  Little Ishikawa, MD ED   Advance Care Planning Activity       Prognosis:  Guarded, could be as short as few weeks if ongoing worsening of mental status and ongoing minimal oral intake.  Discharge Planning:  Recommend memory care unit with palliative services following versus skilled nursing facility rehab with palliative services  following based on Kristin Grimes's hospital  course.  As per psychiatry, no longer appropriate for [facility] psych unit.  Care plan was discussed with husband on the phone, daughter on the phone.  Thank you for allowing the Palliative Medicine Team to assist in the care of this Kristin Grimes.   Time In: 10 Time Out: 10.40 Total Time 40 Prolonged Time Billed No       Greater than 50%  of this time was spent counseling and coordinating care related to the above assessment and plan.  Rosalin Hawking, MD  Please contact Palliative Medicine Team phone at [phone] for questions and concerns.

## 2020-05-04 NOTE — Plan of Care (Signed)
°  Problem: Coping: °Goal: Level of anxiety will decrease °Outcome: Progressing °  °

## 2020-05-04 NOTE — TOC Progression Note (Signed)
Transition of Care [facility]) - Progression Note    Patient Details  Name: Kristin Grimes MRN: [id number] Date of Birth: 16-Jun-1941  [facility] CM/SW Contact  Jr Milliron, Meriam Sprague, RN Phone Number: 05/04/2020, 3:17 PM  Clinical Narrative:    Psych has cleared pt at this time. Spoke with daughter Misty Stanley at length about altering dc plan. We will now be looking for SNF/Memory Care beds. Auth started with [facility]. Misty Stanley has been made aware that if pt does not receive SNF auth then pt will need to go to a facility private pay and apply for Medicaid. TOC has reached out to multiple facilities and a couple are evaluating her clinicals at this time. TOC will continue to follow.   Expected Discharge Plan: Skilled Nursing Facility Barriers to Discharge: SNF Pending bed offer  Expected Discharge Plan and Services Expected Discharge Plan: Skilled Nursing Facility In-house Referral: Clinical Social Work Discharge Planning Services: CM Consult Post Acute Care Choice: Skilled Nursing Facility Living arrangements for the past 2 months: Single Family Home                                       Social Determinants of Health (SDOH) Interventions    Readmission Risk Interventions Readmission Risk Prevention Plan 04/18/2020  Post Dischage Appt Complete  Medication Screening Complete  Transportation Screening Complete

## 2020-05-05 DIAGNOSIS — F0391 Unspecified dementia with behavioral disturbance: Principal | ICD-10-CM

## 2020-05-05 NOTE — Progress Notes (Signed)
Daily Progress Note   Patient Name: Kristin Grimes       Date: 05/05/2020 DOB: [birth year]  Age: 79 y.o. MRN#: [id number] Attending Physician: Narda Bonds, MD Primary Care Physician: Patient, No Pcp Per Admit Date: 04/15/2020  Reason for Consultation/Follow-up: Establishing goals of care  Subjective:  patient is somewhat more alert this morning, in no distress currently, chart reviewed, episodic agitation at times.     See below.   Length of Stay: 20  Current Medications: Scheduled Meds:  . acetaminophen  650 mg Oral Q8H  . carvedilol  3.125 mg Oral BID WC  . enoxaparin (LOVENOX) injection  40 mg Subcutaneous Q24H  . escitalopram  20 mg Oral Daily  . levothyroxine  112 mcg Oral QAC breakfast  . lisinopril  40 mg Oral Daily  . pantoprazole  40 mg Oral Daily  . polyethylene glycol  17 g Oral Daily  . polyvinyl alcohol  1 drop Both Eyes BID  . QUEtiapine  25 mg Oral Daily  . QUEtiapine  50 mg Oral QHS  . simethicone  80 mg Oral QID    Continuous Infusions:   PRN Meds: acetaminophen, bisacodyl, ondansetron, polyvinyl alcohol  Physical Exam         Not alert Restless Less agitated Regular work of breathing S1 S2 Trace edema Abdomen not distended  Vital Signs: BP (!) 144/83   Pulse 66   Temp 98.1 F (36.7 C)   Resp 18   Ht 5\' 5"  (1.651 m)   Wt 83.3 kg   SpO2 90%   BMI 30.55 kg/m  SpO2: SpO2: 90 % O2 Device: O2 Device: Room Air O2 Flow Rate: O2 Flow Rate (L/min): 0 L/min  Intake/output summary:   Intake/Output Summary (Last 24 hours) at 05/05/2020 1143 Last data filed at 05/05/2020 05/07/2020 Gross per 24 hour  Intake 720 ml  Output 2 ml  Net 718 ml   LBM: Last BM Date: 05/04/20 Baseline Weight: Weight: 82.1 kg Most recent weight: Weight: 83.3 kg         Palliative Assessment/Data: PPS 20%     Patient Active Problem List   Diagnosis Date Noted  . Esophageal hernia   . Palliative care encounter   . DNR (do not resuscitate)   . Dementia with behavioral disturbance ([facility]) 04/21/2020  . Advanced  dementia ([place]) 04/19/2020  . Altered mental status   . Failure to thrive in adult   . Dysphagia   . Acute metabolic encephalopathy 26/37/8588    Palliative Care Assessment & Plan   Patient Profile: 79 year old lady with advanced dementia, has had dementia for the past 15 years, admitted to the hospital with:   Assessment: Advanced dementia with behavioral issues/agitation Dehydration/ketosis secondary to poor p.o. intake History of gaseous distention/dyspepsia Worked up for questionable syncopal event, also had mild acute kidney injury earlier in this hospitalization History of hypertension, hypothyroidism Functional and cognitive decline Minimal to nil oral intake Restlessness/agitation.  Recommendations/Plan:  continue current supportive care.  Search is ongoing for safest possible disposition option, appreciate TOC assistance.   Code Status:    Code Status Orders  (From admission, onward)         Start     Ordered   04/23/20 1424  Do not attempt resuscitation (DNR)  Continuous       Question Answer Comment  In the event of cardiac or respiratory ARREST Do not call a "code blue"   In the event of cardiac or respiratory ARREST Do not perform Intubation, CPR, defibrillation or ACLS   In the event of cardiac or respiratory ARREST Use medication by any route, position, wound care, and other measures to relive pain and suffering. May use oxygen, suction and manual treatment of airway obstruction as needed for comfort.      04/23/20 1424        Code Status History    Date Active Date Inactive Code Status Order ID Comments User Context   04/15/2020 1508 04/23/2020 1424 Full Code [id number]  Little Ishikawa, MD ED   Advance  Care Planning Activity       Prognosis:  Guarded, could be as short as few weeks if ongoing worsening of mental status and ongoing minimal oral intake.  Discharge Planning:  Recommend memory care unit with palliative services following versus skilled nursing facility rehab with palliative services following based on patient's hospital course.  As per psychiatry, no longer appropriate for [facility] psych unit.  Care plan was discussed with husband on the phone, daughter on the phone.  Thank you for allowing the Palliative Medicine Team to assist in the care of this patient.   Time In: 10 Time Out: 10.15 Total Time 15 Prolonged Time Billed No       Greater than 50%  of this time was spent counseling and coordinating care related to the above assessment and plan.  Loistine Chance, MD  Please contact Palliative Medicine Team phone at [phone] for questions and concerns.

## 2020-05-05 NOTE — Progress Notes (Signed)
PROGRESS NOTE    Kristin Grimes  ACZ:[id number] DOB: May 24, 1941 DOA: 04/15/2020 PCP: Patient, No Pcp Per   Brief Narrative: Kristin Grimes is a 79 y.o. female with a history of dementia. Patient presented secondary to mental status change and combativeness in setting of dementia. Patient evaluated by psychiatry with initial recommendations for inpatient behavioral admission. Medications adjusted and now recommendations for SNF/memory care units discharge.   Assessment & Plan:   Principal Problem:   Advanced dementia ([place]) Active Problems:   Altered mental status   Failure to thrive in adult   Dysphagia   Dementia with behavioral disturbance ([place])   Esophageal hernia   Palliative care encounter   DNR (do not resuscitate)   Advanced dementia with behavioral disturbance Patient with agitation issues initially. Psychiatry was consulted and initially recommended geriatric inpatient psychiatry admission. Patient was managed on Zydis with improvement to agitation but increased somnolence. Regimen switched to Seroquel. Recommendations to continue Lexapro. Updated recommendations for no inpatient psychiatry admission. -Continue Lexapro and Seroquel  Dehydration Secondary to poor oral intake. History of Zinker's diverticulum with previous fundoplication. Per chart review, family did not wish for further management. -Diet as tolerated  Possibly syncopal event Possibly orthostatic related.  CKD stage III Baseline creatinine of 1.2. Does not appear to meet criteria for AKI.  Essential hypertension Uncontrolled. -Continue Coreg and restart home lisinopril  Hypothyroidism Patient with a recent TSH of 82.5 on 04/17/2020. TSH now trended down to 24.9 -Continue Synthroid 112 mcg daily  Eye irritation Eye drops prescribed -Continue eye drops  Back pain Chronic issue. Patient states she knows how to deal with it. -Tylenol prn   DVT prophylaxis: Lovenox Code Status:   Code  Status: DNR Family Communication: None at bedside Disposition Plan: Discharge to SNF/memory care unit when bed available.   Consultants:   Psychiatry  Palliative care medicine  Procedures:   None  Antimicrobials:  None    Subjective: No concerns. Has back pain but says she has had it for years and knows how to manage it.   Objective: Vitals:   05/04/20 1803 05/04/20 2125 05/05/20 0941 05/05/20 1454  BP: (!) 146/113 (!) 107/50 (!) 144/83 136/66  Pulse: 77 61 66 69  Resp:  18  16  Temp:  98.1 F (36.7 C)    TempSrc:      SpO2:  90%  97%  Weight:      Height:        Intake/Output Summary (Last 24 hours) at 05/05/2020 1516 Last data filed at 05/05/2020 1451 Gross per 24 hour  Intake 720 ml  Output 3 ml  Net 717 ml   Filed Weights   04/16/20 0116 04/19/20 1255  Weight: 82.1 kg 83.3 kg    Examination:  General exam: Appears calm and comfortable Respiratory system: Clear to auscultation. Respiratory effort normal. Cardiovascular system: S1 & S2 heard, RRR. No murmurs, rubs, gallops or clicks. Gastrointestinal system: Abdomen is nondistended, soft and nontender. No organomegaly or masses felt. Normal bowel sounds heard. Central nervous system: Alert and oriented to person only. Musculoskeletal: No edema. No calf tenderness Skin: No cyanosis. No rashes Psychiatry: Judgement and insight appear impaired.    Data Reviewed: I have personally reviewed following labs and imaging studies  CBC Lab Results  Component Value Date   WBC 7.3 05/02/2020   RBC 4.09 05/02/2020   HGB 12.7 05/02/2020   HCT 39.5 05/02/2020   MCV 96.6 05/02/2020   MCH 31.1 05/02/2020   PLT 260 05/02/2020  MCHC 32.2 05/02/2020   RDW 14.1 05/02/2020   LYMPHSABS 1.9 05/02/2020   MONOABS 1.4 (H) 05/02/2020   EOSABS 0.2 05/02/2020   BASOSABS 0.1 05/02/2020     Last metabolic panel Lab Results  Component Value Date   NA 135 05/02/2020   K 3.9 05/02/2020   CL 101 05/02/2020   CO2  24 05/02/2020   BUN 10 05/02/2020   CREATININE 1.13 (H) 05/02/2020   GLUCOSE 104 (H) 05/02/2020   GFRNONAA 47 (L) 05/02/2020   GFRAA 54 (L) 05/02/2020   CALCIUM 9.1 05/02/2020   PHOS 3.2 04/21/2020   PROT 6.1 (L) 05/02/2020   ALBUMIN 3.2 (L) 05/02/2020   BILITOT 0.4 05/02/2020   ALKPHOS 76 05/02/2020   AST 25 05/02/2020   ALT 27 05/02/2020   ANIONGAP 10 05/02/2020    CBG (last 3)  No results for input(s): GLUCAP in the last 72 hours.   GFR: Estimated Creatinine Clearance: 44.4 mL/min (A) (by C-G formula based on SCr of 1.13 mg/dL (H)).  Coagulation Profile: No results for input(s): INR, PROTIME in the last 168 hours.  No results found for this or any previous visit (from the past 240 hour(s)).      Radiology Studies: No results found.      Scheduled Meds: . acetaminophen  650 mg Oral Q8H  . carvedilol  3.125 mg Oral BID WC  . enoxaparin (LOVENOX) injection  40 mg Subcutaneous Q24H  . escitalopram  20 mg Oral Daily  . levothyroxine  112 mcg Oral QAC breakfast  . lisinopril  40 mg Oral Daily  . pantoprazole  40 mg Oral Daily  . polyethylene glycol  17 g Oral Daily  . polyvinyl alcohol  1 drop Both Eyes BID  . QUEtiapine  25 mg Oral Daily  . QUEtiapine  50 mg Oral QHS  . simethicone  80 mg Oral QID   Continuous Infusions:   LOS: 20 days     Jacquelin Hawking, MD [facility] 05/05/2020, 3:16 PM  If 7PM-7AM, please contact night-coverage [email or web address]

## 2020-05-06 LAB — CBC
HCT: 41.8 % (ref 36.0–46.0)
Hemoglobin: 13.7 g/dL (ref 12.0–15.0)
MCH: 31.5 pg (ref 26.0–34.0)
MCHC: 32.8 g/dL (ref 30.0–36.0)
MCV: 96.1 fL (ref 80.0–100.0)
Platelets: 421 10*3/uL — ABNORMAL HIGH (ref 150–400)
RBC: 4.35 MIL/uL (ref 3.87–5.11)
RDW: 14.4 % (ref 11.5–15.5)
WBC: 10.5 10*3/uL (ref 4.0–10.5)
nRBC: 0 % (ref 0.0–0.2)

## 2020-05-06 MED ORDER — TUBERCULIN PPD 5 UNIT/0.1ML ID SOLN
5.0000 [IU] | Freq: Once | INTRADERMAL | Status: AC
  Administered 2020-05-06: 5 [IU] via INTRADERMAL
  Filled 2020-05-06: qty 0.1

## 2020-05-06 MED ORDER — CARVEDILOL 6.25 MG PO TABS
6.2500 mg | ORAL_TABLET | Freq: Two times a day (BID) | ORAL | Status: DC
  Administered 2020-05-06 – 2020-05-09 (×5): 6.25 mg via ORAL
  Filled 2020-05-06 (×7): qty 1

## 2020-05-06 NOTE — Plan of Care (Signed)
Pt mildly hypertensive this am.  Administered scheduled BP med per MAR.  No complaints at this time.   Problem: Clinical Measurements: Goal: Ability to maintain clinical measurements within normal limits will improve Outcome: Progressing Goal: Will remain free from infection Outcome: Progressing Goal: Diagnostic test results will improve Outcome: Progressing Goal: Respiratory complications will improve Outcome: Progressing Goal: Cardiovascular complication will be avoided Outcome: Progressing   Problem: Activity: Goal: Risk for activity intolerance will decrease Outcome: Progressing   Problem: Nutrition: Goal: Adequate nutrition will be maintained Outcome: Progressing   Problem: Coping: Goal: Level of anxiety will decrease Outcome: Progressing   Problem: Elimination: Goal: Will not experience complications related to bowel motility Outcome: Progressing Goal: Will not experience complications related to urinary retention Outcome: Progressing   Problem: Pain Managment: Goal: General experience of comfort will improve Outcome: Progressing   Problem: Safety: Goal: Ability to remain free from injury will improve Outcome: Progressing   Problem: Skin Integrity: Goal: Risk for impaired skin integrity will decrease Outcome: Progressing   Problem: Education: Goal: Knowledge of General Education information will improve Description: Including pain rating scale, medication(s)/side effects and non-pharmacologic comfort measures Outcome: Not Progressing   Problem: Health Behavior/Discharge Planning: Goal: Ability to manage health-related needs will improve Outcome: Not Progressing

## 2020-05-06 NOTE — TOC Progression Note (Signed)
Transition of Care [facility]) - Progression Note    Patient Details  Name: Kristin Grimes MRN: [id number] Date of Birth: Mar 09, 1941  [facility] CM/SW Contact  Glendell Schlottman, Meriam Sprague, RN Phone Number: 05/06/2020, 2:58 PM  Clinical Narrative:    TOC continuing to follow for DC planning. Multiple SNFs contacted to take pt and have declined. [facility] liaison to come and do a face to face with pt today to see if she would be appropriate for their facility. [facility] is also showing some interest and may be able to send a RN to see her on Monday.   Expected Discharge Plan: Skilled Nursing Facility Barriers to Discharge: SNF Pending bed offer  Expected Discharge Plan and Services Expected Discharge Plan: Skilled Nursing Facility In-house Referral: Clinical Social Work Discharge Planning Services: CM Consult Post Acute Care Choice: Skilled Nursing Facility Living arrangements for the past 2 months: Single Family Home                   Social Determinants of Health (SDOH) Interventions    Readmission Risk Interventions Readmission Risk Prevention Plan 04/18/2020  Post Dischage Appt Complete  Medication Screening Complete  Transportation Screening Complete

## 2020-05-06 NOTE — Progress Notes (Signed)
PROGRESS NOTE    Kristin Grimes  TKW:[id number] DOB: [birth year] DOA: 04/15/2020 PCP: Patient, No Pcp Per   Brief Narrative: Kristin Grimes is a 79 y.o. female with a history of dementia. Patient presented secondary to mental status change and combativeness in setting of dementia. Patient evaluated by psychiatry with initial recommendations for inpatient behavioral admission. Medications adjusted and now recommendations for SNF/memory care units discharge.   Assessment & Plan:   Principal Problem:   Advanced dementia ([place]) Active Problems:   Altered mental status   Failure to thrive in adult   Dysphagia   Dementia with behavioral disturbance ([place])   Esophageal hernia   Palliative care encounter   DNR (do not resuscitate)   Advanced dementia with behavioral disturbance Patient with agitation issues initially. Psychiatry was consulted and initially recommended geriatric inpatient psychiatry admission. Patient was managed on Zydis with improvement to agitation but increased somnolence. Regimen switched to Seroquel. Recommendations to continue Lexapro. Updated recommendations for no inpatient psychiatry admission. -Continue Lexapro and Seroquel  Dehydration Secondary to poor oral intake. History of Zinker's diverticulum with previous fundoplication. Per chart review, family did not wish for further management. -Diet as tolerated  Possibly syncopal event Possibly orthostatic related.  CKD stage III Baseline creatinine of 1.2. Does not appear to meet criteria for AKI.  Essential hypertension Uncontrolled. -Continue lisinopril 40 mg daily; increase to Coreg 6.25 mg BID  Hypothyroidism Patient with a recent TSH of 82.5 on 04/17/2020. TSH now trended down to 24.9 -Continue Synthroid 112 mcg daily  Eye irritation Eye drops prescribed -Continue eye drops  Back pain Chronic issue. Patient states she knows how to deal with it. -Tylenol prn   DVT prophylaxis:  Lovenox Code Status:   Code Status: DNR Family Communication: None at bedside Disposition Plan: Discharge to SNF/memory care unit when bed available.   Consultants:   Psychiatry  Palliative care medicine  Procedures:   None  Antimicrobials:  None    Subjective: Sleeping. No issues noted overnight.  Objective: Vitals:   05/05/20 1658 05/05/20 2055 05/06/20 0436 05/06/20 0814  BP: 137/78 (!) 142/112 (!) 149/115 (!) 133/113  Pulse: 62 76 75 81  Resp:  18 17   Temp:  98.3 F (36.8 C) 98.5 F (36.9 C)   TempSrc:  Axillary Oral   SpO2:   98%   Weight:      Height:        Intake/Output Summary (Last 24 hours) at 05/06/2020 1045 Last data filed at 05/06/2020 1006 Gross per 24 hour  Intake 480 ml  Output 2 ml  Net 478 ml   Filed Weights   04/16/20 0116 04/19/20 1255  Weight: 82.1 kg 83.3 kg    Examination:  General: Chronically ill appearing, no distress    Data Reviewed: I have personally reviewed following labs and imaging studies  CBC Lab Results  Component Value Date   WBC 10.5 05/06/2020   RBC 4.35 05/06/2020   HGB 13.7 05/06/2020   HCT 41.8 05/06/2020   MCV 96.1 05/06/2020   MCH 31.5 05/06/2020   PLT 421 (H) 05/06/2020   MCHC 32.8 05/06/2020   RDW 14.4 05/06/2020   LYMPHSABS 1.9 05/02/2020   MONOABS 1.4 (H) 05/02/2020   EOSABS 0.2 05/02/2020   BASOSABS 0.1 92/42/6834     Last metabolic panel Lab Results  Component Value Date   NA 135 05/02/2020   K 3.9 05/02/2020   CL 101 05/02/2020   CO2 24 05/02/2020   BUN 10 05/02/2020  CREATININE 1.13 (H) 05/02/2020   GLUCOSE 104 (H) 05/02/2020   GFRNONAA 47 (L) 05/02/2020   GFRAA 54 (L) 05/02/2020   CALCIUM 9.1 05/02/2020   PHOS 3.2 04/21/2020   PROT 6.1 (L) 05/02/2020   ALBUMIN 3.2 (L) 05/02/2020   BILITOT 0.4 05/02/2020   ALKPHOS 76 05/02/2020   AST 25 05/02/2020   ALT 27 05/02/2020   ANIONGAP 10 05/02/2020    CBG (last 3)  No results for input(s): GLUCAP in the last 72 hours.    GFR: Estimated Creatinine Clearance: 44.4 mL/min (A) (by C-G formula based on SCr of 1.13 mg/dL (H)).  Coagulation Profile: No results for input(s): INR, PROTIME in the last 168 hours.  No results found for this or any previous visit (from the past 240 hour(s)).      Radiology Studies: No results found.      Scheduled Meds:  acetaminophen  650 mg Oral Q8H   carvedilol  6.25 mg Oral BID WC   enoxaparin (LOVENOX) injection  40 mg Subcutaneous Q24H   escitalopram  20 mg Oral Daily   levothyroxine  112 mcg Oral QAC breakfast   lisinopril  40 mg Oral Daily   pantoprazole  40 mg Oral Daily   polyethylene glycol  17 g Oral Daily   polyvinyl alcohol  1 drop Both Eyes BID   QUEtiapine  25 mg Oral Daily   QUEtiapine  50 mg Oral QHS   simethicone  80 mg Oral QID   Continuous Infusions:   LOS: 21 days     Jacquelin Hawking, MD [facility] 05/06/2020, 10:45 AM  If 7PM-7AM, please contact night-coverage [email or web address]

## 2020-05-07 LAB — SARS CORONAVIRUS 2 (TAT 6-24 HRS): SARS Coronavirus 2: NEGATIVE

## 2020-05-07 NOTE — Progress Notes (Signed)
PROGRESS NOTE    Kristin Grimes  GXQ:[id number] DOB: [birth year] DOA: 04/15/2020 PCP: Patient, No Pcp Per   Brief Narrative: Kristin Grimes is a 79 y.o. female with a history of dementia. Patient presented secondary to mental status change and combativeness in setting of dementia. Patient evaluated by psychiatry with initial recommendations for inpatient behavioral admission. Medications adjusted and now recommendations for SNF/memory care units discharge.   Assessment & Plan:   Principal Problem:   Advanced dementia ([place]) Active Problems:   Altered mental status   Failure to thrive in adult   Dysphagia   Dementia with behavioral disturbance ([place])   Esophageal hernia   Palliative care encounter   DNR (do not resuscitate)   Advanced dementia with behavioral disturbance Patient with agitation issues initially. Psychiatry was consulted and initially recommended geriatric inpatient psychiatry admission. Patient was managed on Zydis with improvement to agitation but increased somnolence. Regimen switched to Seroquel. Recommendations to continue Lexapro. Updated recommendations for no inpatient psychiatry admission. -Continue Lexapro and Seroquel  Dehydration Secondary to poor oral intake. History of Zinker's diverticulum with previous fundoplication. Per chart review, family did not wish for further management. -Diet as tolerated  Possibly syncopal event Possibly orthostatic related.  CKD stage III Baseline creatinine of 1.2. Does not appear to meet criteria for AKI.  Essential hypertension Somewhat labile. Blood pressure improved overall. -Continue lisinopril 40 mg daily; Continue Coreg 6.25 mg BID  Hypothyroidism Patient with a recent TSH of 82.5 on 04/17/2020. TSH now trended down to 24.9 -Continue Synthroid 112 mcg daily  Eye irritation Eye drops prescribed -Continue eye drops  Back pain Chronic issue. Patient states she knows how to deal with it. -Tylenol  prn   DVT prophylaxis: Lovenox Code Status:   Code Status: DNR Family Communication: None at bedside Disposition Plan: Discharge to SNF/memory care unit when bed available. Anticipate discharge in two days.   Consultants:   Psychiatry  Palliative care medicine  Procedures:   None  Antimicrobials:  None    Subjective: No issues noted overnight.  Objective: Vitals:   05/06/20 1346 05/06/20 1759 05/06/20 1944 05/07/20 0655  BP: 101/86 120/61 (!) 148/85 94/75  Pulse: 73 75 69 71  Resp: 18  18 16   Temp: 98.6 F (37 C)  98 F (36.7 C) (!) 97.5 F (36.4 C)  TempSrc: Oral  Oral Oral  SpO2: 94%  94% 100%  Weight:      Height:        Intake/Output Summary (Last 24 hours) at 05/07/2020 1214 Last data filed at 05/07/2020 0816 Gross per 24 hour  Intake 300 ml  Output 125 ml  Net 175 ml   Filed Weights   04/16/20 0116 04/19/20 1255  Weight: 82.1 kg 83.3 kg    Examination:  General: Well appearing, no distress    Data Reviewed: I have personally reviewed following labs and imaging studies  CBC Lab Results  Component Value Date   WBC 10.5 05/06/2020   RBC 4.35 05/06/2020   HGB 13.7 05/06/2020   HCT 41.8 05/06/2020   MCV 96.1 05/06/2020   MCH 31.5 05/06/2020   PLT 421 (H) 05/06/2020   MCHC 32.8 05/06/2020   RDW 14.4 05/06/2020   LYMPHSABS 1.9 05/02/2020   MONOABS 1.4 (H) 05/02/2020   EOSABS 0.2 05/02/2020   BASOSABS 0.1 14/48/1856     Last metabolic panel Lab Results  Component Value Date   NA 135 05/02/2020   K 3.9 05/02/2020   CL 101 05/02/2020  CO2 24 05/02/2020   BUN 10 05/02/2020   CREATININE 1.13 (H) 05/02/2020   GLUCOSE 104 (H) 05/02/2020   GFRNONAA 47 (L) 05/02/2020   GFRAA 54 (L) 05/02/2020   CALCIUM 9.1 05/02/2020   PHOS 3.2 04/21/2020   PROT 6.1 (L) 05/02/2020   ALBUMIN 3.2 (L) 05/02/2020   BILITOT 0.4 05/02/2020   ALKPHOS 76 05/02/2020   AST 25 05/02/2020   ALT 27 05/02/2020   ANIONGAP 10 05/02/2020    CBG (last 3)   No results for input(s): GLUCAP in the last 72 hours.   GFR: Estimated Creatinine Clearance: 44.4 mL/min (A) (by C-G formula based on SCr of 1.13 mg/dL (H)).  Coagulation Profile: No results for input(s): INR, PROTIME in the last 168 hours.  Recent Results (from the past 240 hour(s))  SARS CORONAVIRUS 2 (TAT 6-24 HRS) Nasopharyngeal Nasopharyngeal Swab     Status: None   Collection Time: 05/06/20  6:08 PM   Specimen: Nasopharyngeal Swab  Result Value Ref Range Status   SARS Coronavirus 2 NEGATIVE NEGATIVE Final    Comment: (NOTE) SARS-CoV-2 target nucleic acids are NOT DETECTED.  The SARS-CoV-2 RNA is generally detectable in upper and lower respiratory specimens during the acute phase of infection. Negative results do not preclude SARS-CoV-2 infection, do not rule out co-infections with other pathogens, and should not be used as the sole basis for treatment or other patient management decisions. Negative results must be combined with clinical observations, patient history, and epidemiological information. The expected result is Negative.  Fact Sheet for Patients: HairSlick.no  Fact Sheet for Healthcare Providers: quierodirigir.com  This test is not yet approved or cleared by the [place] FDA and  has been authorized for detection and/or diagnosis of SARS-CoV-2 by FDA under an Emergency Use Authorization (EUA). This EUA will remain  in effect (meaning this test can be used) for the duration of the COVID-19 declaration under Se ction 564(b)(1) of the Act, 21 U.S.C. section 360bbb-3(b)(1), unless the authorization is terminated or revoked sooner.  Performed at [facility], [address] [place], [place] [address]         Radiology Studies: No results found.      Scheduled Meds: . acetaminophen  650 mg Oral Q8H  . carvedilol  6.25 mg Oral BID WC  . enoxaparin (LOVENOX) injection  40 mg  Subcutaneous Q24H  . escitalopram  20 mg Oral Daily  . levothyroxine  112 mcg Oral QAC breakfast  . lisinopril  40 mg Oral Daily  . pantoprazole  40 mg Oral Daily  . polyethylene glycol  17 g Oral Daily  . polyvinyl alcohol  1 drop Both Eyes BID  . QUEtiapine  25 mg Oral Daily  . QUEtiapine  50 mg Oral QHS  . simethicone  80 mg Oral QID  . tuberculin  5 Units Intradermal Once   Continuous Infusions:   LOS: 22 days     Jacquelin Hawking, MD [facility] 05/07/2020, 12:14 PM  If 7PM-7AM, please contact night-coverage [email or web address]

## 2020-05-08 NOTE — Progress Notes (Signed)
PROGRESS NOTE    Kristin Grimes  UEA:[id number] DOB: 1941-07-16 DOA: 04/15/2020 PCP: Patient, No Pcp Per   Brief Narrative: Kristin Grimes is a 79 y.o. female with a history of dementia. Patient presented secondary to mental status change and combativeness in setting of dementia. Patient evaluated by psychiatry with initial recommendations for inpatient behavioral admission. Medications adjusted and now recommendations for SNF/memory care units discharge.   Assessment & Plan:   Principal Problem:   Advanced dementia ([place]) Active Problems:   Altered mental status   Failure to thrive in adult   Dysphagia   Dementia with behavioral disturbance ([place])   Esophageal hernia   Palliative care encounter   DNR (do not resuscitate)   Advanced dementia with behavioral disturbance Patient with agitation issues initially. Psychiatry was consulted and initially recommended geriatric inpatient psychiatry admission. Patient was managed on Zydis with improvement to agitation but increased somnolence. Regimen switched to Seroquel. Recommendations to continue Lexapro. Updated recommendations for no inpatient psychiatry admission. -Continue Lexapro and Seroquel  Dehydration Secondary to poor oral intake. History of Zinker's diverticulum with previous fundoplication. Per chart review, family did not wish for further management. -Diet as tolerated  Possibly syncopal event Possibly orthostatic related.  CKD stage III Baseline creatinine of 1.2. Does not appear to meet criteria for AKI.  Essential hypertension Somewhat labile. Blood pressure improved overall. -Continue lisinopril 40 mg daily; Continue Coreg 6.25 mg BID  Hypothyroidism Patient with a recent TSH of 82.5 on 04/17/2020. TSH now trended down to 24.9 -Continue Synthroid 112 mcg daily  Eye irritation Eye drops prescribed -Continue eye drops  Back pain Chronic issue. Patient states she knows how to deal with it. -Tylenol  prn   DVT prophylaxis: Lovenox Code Status:   Code Status: DNR Family Communication: None at bedside Disposition Plan: Discharge to SNF/memory care unit when bed available. Anticipate discharge tomorrow.   Consultants:   Psychiatry  Palliative care medicine  Procedures:   None  Antimicrobials:  None    Subjective: No issues  Objective: Vitals:   05/07/20 0655 05/07/20 1351 05/08/20 0000 05/08/20 0650  BP: 94/75 101/62 121/88 (!) 121/102  Pulse: 71 84 67 (!) 59  Resp: 16 19 16 17   Temp: (!) 97.5 F (36.4 C) (!) 97.5 F (36.4 C) 98.2 F (36.8 C) (!) 97.5 F (36.4 C)  TempSrc: Oral Axillary Oral   SpO2: 100% 97% 94% 98%  Weight:      Height:        Intake/Output Summary (Last 24 hours) at 05/08/2020 1019 Last data filed at 05/07/2020 2230 Gross per 24 hour  Intake 420 ml  Output --  Net 420 ml   Filed Weights   04/16/20 0116 04/19/20 1255  Weight: 82.1 kg 83.3 kg    Examination:  General exam: Appears calm and comfortable Respiratory system: Clear to auscultation. Respiratory effort normal. Cardiovascular system: S1 & S2 heard, RRR. No murmurs, rubs, gallops or clicks. Gastrointestinal system: Abdomen is nondistended, soft and nontender. No organomegaly or masses felt. Normal bowel sounds heard. Central nervous system: Alert and not oriented. Musculoskeletal: No edema. No calf tenderness Skin: No cyanosis. No rashes Psychiatry: Judgement and insight appear impaired.     Data Reviewed: I have personally reviewed following labs and imaging studies  CBC Lab Results  Component Value Date   WBC 10.5 05/06/2020   RBC 4.35 05/06/2020   HGB 13.7 05/06/2020   HCT 41.8 05/06/2020   MCV 96.1 05/06/2020   MCH 31.5 05/06/2020  PLT 421 (H) 05/06/2020   MCHC 32.8 05/06/2020   RDW 14.4 05/06/2020   LYMPHSABS 1.9 05/02/2020   MONOABS 1.4 (H) 05/02/2020   EOSABS 0.2 05/02/2020   BASOSABS 0.1 05/02/2020     Last metabolic panel Lab Results   Component Value Date   NA 135 05/02/2020   K 3.9 05/02/2020   CL 101 05/02/2020   CO2 24 05/02/2020   BUN 10 05/02/2020   CREATININE 1.13 (H) 05/02/2020   GLUCOSE 104 (H) 05/02/2020   GFRNONAA 47 (L) 05/02/2020   GFRAA 54 (L) 05/02/2020   CALCIUM 9.1 05/02/2020   PHOS 3.2 04/21/2020   PROT 6.1 (L) 05/02/2020   ALBUMIN 3.2 (L) 05/02/2020   BILITOT 0.4 05/02/2020   ALKPHOS 76 05/02/2020   AST 25 05/02/2020   ALT 27 05/02/2020   ANIONGAP 10 05/02/2020    CBG (last 3)  No results for input(s): GLUCAP in the last 72 hours.   GFR: Estimated Creatinine Clearance: 44.4 mL/min (A) (by C-G formula based on SCr of 1.13 mg/dL (H)).  Coagulation Profile: No results for input(s): INR, PROTIME in the last 168 hours.  Recent Results (from the past 240 hour(s))  SARS CORONAVIRUS 2 (TAT 6-24 HRS) Nasopharyngeal Nasopharyngeal Swab     Status: None   Collection Time: 05/06/20  6:08 PM   Specimen: Nasopharyngeal Swab  Result Value Ref Range Status   SARS Coronavirus 2 NEGATIVE NEGATIVE Final    Comment: (NOTE) SARS-CoV-2 target nucleic acids are NOT DETECTED.  The SARS-CoV-2 RNA is generally detectable in upper and lower respiratory specimens during the acute phase of infection. Negative results do not preclude SARS-CoV-2 infection, do not rule out co-infections with other pathogens, and should not be used as the sole basis for treatment or other patient management decisions. Negative results must be combined with clinical observations, patient history, and epidemiological information. The expected result is Negative.  Fact Sheet for Patients: HairSlick.no  Fact Sheet for Healthcare Providers: quierodirigir.com  This test is not yet approved or cleared by the [place] FDA and  has been authorized for detection and/or diagnosis of SARS-CoV-2 by FDA under an Emergency Use Authorization (EUA). This EUA will remain  in  effect (meaning this test can be used) for the duration of the COVID-19 declaration under Se ction 564(b)(1) of the Act, 21 U.S.C. section 360bbb-3(b)(1), unless the authorization is terminated or revoked sooner.  Performed at [facility], [address], [place], [place] [address]         Radiology Studies: No results found.      Scheduled Meds: . acetaminophen  650 mg Oral Q8H  . carvedilol  6.25 mg Oral BID WC  . enoxaparin (LOVENOX) injection  40 mg Subcutaneous Q24H  . escitalopram  20 mg Oral Daily  . levothyroxine  112 mcg Oral QAC breakfast  . lisinopril  40 mg Oral Daily  . pantoprazole  40 mg Oral Daily  . polyethylene glycol  17 g Oral Daily  . polyvinyl alcohol  1 drop Both Eyes BID  . QUEtiapine  25 mg Oral Daily  . QUEtiapine  50 mg Oral QHS  . simethicone  80 mg Oral QID  . tuberculin  5 Units Intradermal Once   Continuous Infusions:   LOS: 23 days     Jacquelin Hawking, MD [facility] 05/08/2020, 10:19 AM  If 7PM-7AM, please contact night-coverage [email or web address]

## 2020-05-08 NOTE — Progress Notes (Signed)
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

## 2020-05-09 DIAGNOSIS — Z7189 Other specified counseling: Secondary | ICD-10-CM

## 2020-05-09 DIAGNOSIS — R531 Weakness: Secondary | ICD-10-CM

## 2020-05-09 MED ORDER — LORAZEPAM 0.5 MG PO TABS: 0.5000 mg | ORAL_TABLET | Freq: Two times a day (BID) | ORAL | 0 refills | Status: DC | PRN

## 2020-05-09 MED ORDER — QUETIAPINE FUMARATE 25 MG PO TABS: ORAL_TABLET | ORAL | 0 refills | Status: DC

## 2020-05-09 MED ORDER — CARVEDILOL 6.25 MG PO TABS: 6.2500 mg | ORAL_TABLET | Freq: Two times a day (BID) | ORAL | 0 refills | Status: DC

## 2020-05-09 NOTE — Plan of Care (Signed)

## 2020-05-09 NOTE — [place] FL2 (Signed)
[place] MEDICAID FL2 LEVEL OF CARE SCREENING TOOL     IDENTIFICATION  Patient Name: Kristin Grimes Birthdate: [birth year] Sex: female Admission Date (Current Location): 04/15/2020  [facility] and [place] Number:  Producer, television/film/video and Address:  [facility],  [address] [place], [place] [address]      Provider Number: [id number]  Attending Physician Name and Address:  Narda Bonds, MD  Relative Name and Phone Number:  Otho Ket [phone]    Current Level of Care: Hospital Recommended Level of Care: Memory Care Prior Approval Number:    Date Approved/Denied:   PASRR Number: [id number]  Discharge Plan: Other (Comment) (Memory care)    Current Diagnoses: Patient Active Problem List   Diagnosis Date Noted  . Esophageal hernia   . Palliative care encounter   . DNR (do not resuscitate)   . Dementia with behavioral disturbance (HCC) 04/21/2020  . Advanced dementia (HCC) 04/19/2020  . Altered mental status   . Failure to thrive in adult   . Dysphagia   . Acute metabolic encephalopathy 04/15/2020    Orientation RESPIRATION BLADDER Height & Weight     Self  Normal Incontinent Weight: 83.3 kg Height:  5\' 5"  (165.1 cm)  BEHAVIORAL SYMPTOMS/MOOD NEUROLOGICAL BOWEL NUTRITION STATUS      Incontinent Diet (Dysphagia 1/ Pureed)  AMBULATORY STATUS COMMUNICATION OF NEEDS Skin   Extensive Assist Verbally Normal                       Personal Care Assistance Level of Assistance  Total care Bathing Assistance: Maximum assistance Feeding assistance: Maximum assistance   Total Care Assistance: Maximum assistance   Functional Limitations Info             SPECIAL CARE FACTORS FREQUENCY  PT (By licensed PT), OT (By licensed OT)     PT Frequency: 5x/wk OT Frequency: 5x/wk            Contractures Contractures Info: Not present    Additional Factors Info  Psychotropic, Allergies, Code Status Code Status Info: DNR Allergies Info:  NKDA Psychotropic Info: See MAR         Current Medications (05/09/2020):  This is the current hospital active medication list Current Facility-Administered Medications  Medication Dose Route Frequency Provider Last Rate Last Admin  . acetaminophen (TYLENOL) tablet 650 mg  650 mg Oral Q6H PRN 05/11/2020, MD   650 mg at 04/19/20 2305  . acetaminophen (TYLENOL) tablet 650 mg  650 mg Oral Q8H Dellinger, Marianne L, PA-C   650 mg at 05/09/20 0647  . bisacodyl (DULCOLAX) suppository 10 mg  10 mg Rectal Daily PRN Kc, Ramesh, MD      . carvedilol (COREG) tablet 6.25 mg  6.25 mg Oral BID WC 05/11/20, MD   6.25 mg at 05/09/20 0829  . enoxaparin (LOVENOX) injection 40 mg  40 mg Subcutaneous Q24H 05/11/20, RPH   40 mg at 05/09/20 1118  . escitalopram (LEXAPRO) tablet 20 mg  20 mg Oral Daily Pokhrel, Laxman, MD   20 mg at 05/08/20 1125  . levothyroxine (SYNTHROID) tablet 112 mcg  112 mcg Oral QAC breakfast 05/10/20, MD   112 mcg at 05/09/20 0648  . lisinopril (ZESTRIL) tablet 40 mg  40 mg Oral Daily 05/11/20, MD   40 mg at 05/08/20 1132  . ondansetron (ZOFRAN-ODT) disintegrating tablet 4 mg  4 mg Oral Q8H PRN 05/10/20, MD  4 mg at 05/09/20 1045  . pantoprazole (PROTONIX) EC tablet 40 mg  40 mg Oral Daily Pokhrel, Laxman, MD   40 mg at 05/08/20 1126  . polyethylene glycol (MIRALAX / GLYCOLAX) packet 17 g  17 g Oral Daily Pokhrel, Laxman, MD   17 g at 05/08/20 1250  . polyvinyl alcohol (LIQUIFILM TEARS) 1.4 % ophthalmic solution 1 drop  1 drop Both Eyes PRN Pokhrel, Laxman, MD   1 drop at 05/09/20 1120  . polyvinyl alcohol (LIQUIFILM TEARS) 1.4 % ophthalmic solution 1 drop  1 drop Both Eyes BID Kc, Ramesh, MD   1 drop at 05/08/20 2009  . QUEtiapine (SEROQUEL) tablet 25 mg  25 mg Oral Daily Rise Patience, MD   25 mg at 05/08/20 1126  . QUEtiapine (SEROQUEL) tablet 50 mg  50 mg Oral QHS Nita Sells, MD   50 mg at 05/08/20 1954  . simethicone  (MYLICON) chewable tablet 80 mg  80 mg Oral QID Pokhrel, Laxman, MD   80 mg at 05/08/20 2212     Discharge Medications:   Medication List    TAKE these medications   atorvastatin 40 MG tablet Commonly known as: LIPITOR Take 40 mg by mouth daily.   carvedilol 6.25 MG tablet Commonly known as: COREG Take 1 tablet (6.25 mg total) by mouth 2 (two) times daily with a meal. What changed:   medication strength  how much to take   cholecalciferol 25 MCG (1000 UNIT) tablet Commonly known as: VITAMIN D3 Take 1,000 Units by mouth daily.   escitalopram 20 MG tablet Commonly known as: LEXAPRO Take 20 mg by mouth daily.   ferrous sulfate 324 MG Tbec Take 324 mg by mouth.   levothyroxine 112 MCG tablet Commonly known as: SYNTHROID Take 112 mcg by mouth daily before breakfast.   lisinopril 40 MG tablet Commonly known as: ZESTRIL Take 40 mg by mouth daily.   LORazepam 0.5 MG tablet Commonly known as: ATIVAN Take 1 tablet (0.5 mg total) by mouth 2 (two) times daily as needed for anxiety or sleep.   multivitamin with minerals Tabs tablet Take 1 tablet by mouth daily.   NIFEdipine 30 MG 24 hr tablet Commonly known as: ADALAT CC Take 30 mg by mouth daily.   ondansetron 8 MG tablet Commonly known as: ZOFRAN Take 8 mg by mouth every 8 (eight) hours as needed for nausea or vomiting.   pantoprazole 40 MG tablet Commonly known as: PROTONIX Take 40 mg by mouth daily.   QUEtiapine 25 MG tablet Commonly known as: SEROQUEL Take 1 tablet (25 mg total) by mouth in the morning AND 2 tablets (50 mg total) at bedtime.   vitamin B-12 500 MCG tablet Commonly known as: CYANOCOBALAMIN Take 500 mcg by mouth daily.        Relevant Imaging Results:  Relevant Lab Results:   Additional Information SS# [id number]  Eisley Barber, Marjie Skiff, RN

## 2020-05-09 NOTE — Progress Notes (Signed)
Pt BP elevated, this is pt's baseline.  All remaining VS WNL.  Discharge instructions provided to EMS personnel upon arrival.  PIV discontinued by patient during previous shift.  EMS to transport patient to [facility].  RN attempted to contact facility x3 to give report without success.

## 2020-05-09 NOTE — Progress Notes (Signed)
Occupational Therapy Treatment Patient Details Name: Kristin Grimes MRN: [id number] DOB: 07-23-1941 Today's Date: 05/09/2020    History of present illness Pt is 79 yo female admitted with AMS, FTT. Family brought pt to ED-husband unable to care for pt due to dementia with behavioral disturbances.  Pt currently being managed with medications and has had varied behaviors.Marland Kitchen Hx of dementia, scoliosis, esophageal hernia.   OT comments  Limited participation this day  Follow Up Recommendations  SNF          Precautions / Restrictions Precautions Precautions: Fall Precaution Comments: level of cooperation/behavior varies Restrictions Weight Bearing Restrictions: No       Mobility Bed Mobility Overal bed mobility: Needs Assistance Bed Mobility: Supine to Sit;Sit to Supine Rolling: Min assist         General bed mobility comments: rolls from prone to right side with facilitation  Transfers                 General transfer comment: attempted EOB however pt repeatedly states she is too sick        ADL either performed or assessed with clinical judgement   ADL       Grooming: Cueing for sequencing;Wash/dry face;Bed level;Minimal assistance;Moderate assistance Grooming Details (indicate cue type and reason): needed A and increased time this day.                               General ADL Comments: pt very fidigity.  RN aware.               Cognition Arousal/Alertness: Awake/alert Behavior During Therapy: Restless Overall Cognitive Status: Impaired/Different from baseline                                 General Comments: Pt oriented to self only.  She was pleasant but very confused.  Following ~50% of simple commands with multimodal cues/ hand over hand               General Comments pt tends to keep euyes closed but is conversant/alert.  reports feeling sick and RN gave nausea meds. pt spontaneously moves all extremities, pt  is cooperative with donning of hospital gown with hand over hand facilitation    Pertinent Vitals/ Pain       Pain Assessment: Faces Faces Pain Scale: Hurts little more Pain Location: back Pain Descriptors / Indicators: Grimacing;Restless;Guarding         Frequency  Min 2X/week        Progress Toward Goals  OT Goals(current goals can now be found in the care plan section)  Progress towards OT goals: OT to reassess next treatment  Acute Rehab OT Goals Patient Stated Goal: none stated  Plan Discharge plan remains appropriate       AM-PAC OT "6 Clicks" Daily Activity     Outcome Measure   Help from another person eating meals?: A Lot Help from another person taking care of personal grooming?: A Little Help from another person toileting, which includes using toliet, bedpan, or urinal?: Total Help from another person bathing (including washing, rinsing, drying)?: Total Help from another person to put on and taking off regular upper body clothing?: Total Help from another person to put on and taking off regular lower body clothing?: Total 6 Click Score: 9    End of Session    OT  Visit Diagnosis: Unsteadiness on feet (R26.81)   Activity Tolerance Patient limited by pain;Patient limited by lethargy (Constant complaints of back pain.)   Patient Left in bed;with call bell/phone within reach;with bed alarm set;with nursing/sitter in room   Nurse Communication Mobility status (informed patient of food patient ate.)        Time: 0956-1010 OT Time Calculation (min): 14 min  Charges: OT General Charges $OT Visit: 1 Visit OT Treatments $Self Care/Home Management : 8-22 mins  Lise Auer, OT Acute Rehabilitation Services [phone] Office- [phone]      Keval Nam, Karin Golden D 05/09/2020, 2:06 PM

## 2020-05-09 NOTE — Progress Notes (Signed)
Physical Therapy Treatment Patient Details Name: Kristin Grimes MRN: [id number] DOB: 01-28-1941 Today's Date: 05/09/2020    History of Present Illness Pt is 79 yo female admitted with AMS, FTT. Family brought pt to ED-husband unable to care for pt due to dementia with behavioral disturbances.  Pt currently being managed with medications and has had varied behaviors.Marland Kitchen Hx of dementia, scoliosis, esophageal hernia.    PT Comments    Pt reports not feeling well today, restless, minimally participatory d/t c/o nausea and abd discomfort.  See below for details. Continue PT POC. Continue to recommend SNF post acute.   Follow Up Recommendations  SNF;Supervision/Assistance - 24 hour     Equipment Recommendations  None recommended by PT    Recommendations for Other Services       Precautions / Restrictions Precautions Precautions: Fall Precaution Comments: level of cooperation/behavior varies Restrictions Weight Bearing Restrictions: No    Mobility  Bed Mobility Overal bed mobility: Needs Assistance Bed Mobility: Rolling Rolling: Min assist         General bed mobility comments: rolls from prone to right side with facilitation  Transfers                 General transfer comment: attempted EOB however pt repeatedly states she is too sick  Ambulation/Gait                 Stairs             Wheelchair Mobility    Modified Rankin (Stroke Patients Only)       Balance                                            Cognition Arousal/Alertness: Awake/alert Behavior During Therapy: Restless Overall Cognitive Status: Impaired/Different from baseline                                 General Comments: Pt oriented to self only.  She was pleasant but very confused.  Following ~50% of simple commands with multimodal cues/ hand over hand       Exercises      General Comments General comments (skin integrity, edema,  etc.): pt tends to keep euyes closed but is conversant/alert.  reports feeling sick and RN gave nausea meds. pt spontaneously moves all extremities, pt is cooperative with donning of hospital gown with hand over hand facilitation      Pertinent Vitals/Pain Faces Pain Scale: No hurt    Home Living                      Prior Function            PT Goals (current goals can now be found in the care plan section) Acute Rehab PT Goals Patient Stated Goal: none stated PT Goal Formulation: Patient unable to participate in goal setting Time For Goal Achievement: 05/18/20 Potential to Achieve Goals: Fair Progress towards PT goals: Progressing toward goals (slowly)    Frequency    Min 2X/week      PT Plan Current plan remains appropriate    Co-evaluation              AM-PAC PT "6 Clicks" Mobility   Outcome Measure  Help needed turning from your back to your side while in  a flat bed without using bedrails?: A Little Help needed moving from lying on your back to sitting on the side of a flat bed without using bedrails?: A Lot Help needed moving to and from a bed to a chair (including a wheelchair)?: A Lot Help needed standing up from a chair using your arms (e.g., wheelchair or bedside chair)?: A Lot Help needed to walk in hospital room?: A Lot Help needed climbing 3-5 steps with a railing? : Total 6 Click Score: 12    End of Session   Activity Tolerance: Other (comment) (nausea, decr cooperation) Patient left: in bed;with bed alarm set;with call bell/phone within reach   PT Visit Diagnosis: Muscle weakness (generalized) (M62.81);Adult, failure to thrive (R62.7);Difficulty in walking, not elsewhere classified (R26.2)     Time: 8921-1941 PT Time Calculation (min) (ACUTE ONLY): 12 min  Charges:                        Baxter Flattery, PT  Acute Rehab Dept [facility]) [phone] Pager [phone]  05/09/2020    [facility] 05/09/2020, 11:18 AM

## 2020-05-09 NOTE — Progress Notes (Signed)
Daily Progress Note   Patient Name: Kristin Grimes       Date: 05/09/2020 DOB: 09-Jul-1941  Age: 79 y.o. MRN#: [id number] Attending Physician: Mariel Aloe, MD Primary Care Physician: Patient, No Pcp Per Admit Date: 04/15/2020  Reason for Consultation/Follow-up: Establishing goals of care  Subjective: Patient is lying in bed awake, alert, interactive, and not hungry this morning. No distress noted. No family at bedside. Does not appear agitated.  Med history noted, patient receiving PRN Tylenol.   Length of Stay: 24  Current Medications: Scheduled Meds:  . acetaminophen  650 mg Oral Q8H  . carvedilol  6.25 mg Oral BID WC  . enoxaparin (LOVENOX) injection  40 mg Subcutaneous Q24H  . escitalopram  20 mg Oral Daily  . levothyroxine  112 mcg Oral QAC breakfast  . lisinopril  40 mg Oral Daily  . pantoprazole  40 mg Oral Daily  . polyethylene glycol  17 g Oral Daily  . polyvinyl alcohol  1 drop Both Eyes BID  . QUEtiapine  25 mg Oral Daily  . QUEtiapine  50 mg Oral QHS  . simethicone  80 mg Oral QID    Continuous Infusions:   PRN Meds: acetaminophen, bisacodyl, ondansetron, polyvinyl alcohol  Physical Exam         Alert Not agitated Regular work of breathing No edema Abdomen not distended  Vital Signs: BP (!) 149/109   Pulse 97   Temp 98 F (36.7 C)   Resp 18   Ht 5\' 5"  (1.651 m)   Wt 83.3 kg   SpO2 100%   BMI 30.55 kg/m  SpO2: SpO2: 100 % O2 Device: O2 Device: Room Air O2 Flow Rate: O2 Flow Rate (L/min): 0 L/min  Intake/output summary:   Intake/Output Summary (Last 24 hours) at 05/09/2020 0946 Last data filed at 05/08/2020 2018 Gross per 24 hour  Intake 60 ml  Output --  Net 60 ml   LBM: Last BM Date: 05/07/20 Baseline Weight: Weight: 82.1 kg Most  recent weight: Weight: 83.3 kg       Palliative Assessment/Data: PPS 20%     Patient Active Problem List   Diagnosis Date Noted  . Esophageal hernia   . Palliative care encounter   . DNR (do not resuscitate)   . Dementia with behavioral disturbance ([place]) 04/21/2020  .  Advanced dementia (HCC) 04/19/2020  . Altered mental status   . Failure to thrive in adult   . Dysphagia   . Acute metabolic encephalopathy 04/15/2020    Palliative Care Assessment & Plan   Patient Profile: 79 year old lady with advanced dementia, has had dementia for the past 15 years, admitted to the hospital with:   Assessment: Advanced dementia with behavioral issues/agitation Dehydration/ketosis secondary to poor p.o. intake History of gaseous distention/dyspepsia Worked up for questionable syncopal event, also had mild acute kidney injury earlier in this hospitalization History of hypertension, hypothyroidism Functional and cognitive decline Minimal to nil oral intake Restlessness/agitation.  Recommendations/Plan: -Continue current supportive care -Continue DNR/DNI -Discussed with TOC - plan is for ALF with palliative - several different facilities will assess her in house for placement options and outpatient palliative is to follow.   PMT will follow peripherally, no additional recommendations other than as listed above, please call us if we can be of further assistance.    Code Status:    Code Status Orders  (From admission, onward)         Start     Ordered   04/23/20 1424  Do not attempt resuscitation (DNR)  Continuous       Question Answer Comment  In the event of cardiac or respiratory ARREST Do not call a "code blue"   In the event of cardiac or respiratory ARREST Do not perform Intubation, CPR, defibrillation or ACLS   In the event of cardiac or respiratory ARREST Use medication by any route, position, wound care, and other measures to relive pain and suffering. May use oxygen,  suction and manual treatment of airway obstruction as needed for comfort.      04/23/20 1424        Code Status History    Date Active Date Inactive Code Status Order ID Comments User Context   04/15/2020 1508 04/23/2020 1424 Full Code [id number]  Azucena Fallen, MD ED   Advance Care Planning Activity       Prognosis:  Guarded, could be as short as few weeks if ongoing worsening of mental status and ongoing minimal oral intake.   Care plan was discussed with [facility]  Thank you for allowing the Palliative Medicine Team to assist in the care of this patient.   Time In: 0940 Time Out: 0955 Total Time 15 minutes Prolonged Time Billed No        Greater than 50% of this time was spent counseling and coordinating care related to the above assessment and plan.  Amber Dwan Bolt, NP   Rosalin Hawking MD  Please contact Palliative Medicine Team phone at [phone] for questions and concerns.

## 2020-05-09 NOTE — Discharge Summary (Signed)
Physician Discharge Summary  Alona Danford JJH:[id number] DOB: 05/01/1941 DOA: 04/15/2020  PCP: Patient, No Pcp Per  Admit date: 04/15/2020 Discharge date: 05/09/2020  Admitted From: Home Disposition: ALF  Recommendations for Outpatient Follow-up:  1. PCP follow-up  Discharge Condition: Stable CODE STATUS: DNR Diet recommendation: Dysphagia 1 diet  Brief/Interim Summary:  Admission HPI written by Little Ishikawa, MD   Chief Complaint: Mental status changes/combative nature  HPI: Kristin Grimes is a 79 y.o. female with unknown medical history other than advanced dementia, questionable esophageal surgery unspecified.  Patient recently moved from [place] with husband with known advanced dementia with worsening mental status over the past 4 weeks since the move.  Family is concerned that this is a psychotic issue related to her dementia worsening acutely and is now refusing to take p.o.  Patient's family is also concerned that her refusal of p.o. may be related in someway to her distant history of esophageal surgery.  There is discussion about possible syncopal event over the past few weeks but patient was brought to the ED acutely today due to worsening mental status and combative nature with family at home.   ED Course: In the ED patient was evaluated routine lab work was obtained, patient became markedly combative requiring 2 mg of IV Haldol with minimal to no improvement, subsequently patient was given 40 mg of Geodon p.o. with moderate improvement but ultimately decision was made for one-to-one sitter to be at bedside given patient's ongoing removal of close, IV was pulled, telemetry was ripped off.  Unfortunately husband left in the middle of patient's combative state and would not return to help reorient the patient as he was "worn out."  Patient had routine imaging including CT head given mental status changes and chest x-ray with no overt findings.  Patient's lab  work was equally as unimpressive with minimally elevated glucose but otherwise unremarkable for any acute findings.   Hospital course:  Advanced dementia with behavioral disturbance Patient with agitation issues initially. Psychiatry was consulted and initially recommended geriatric inpatient psychiatry admission. Patient was managed on Zydis with improvement to agitation but increased somnolence. Regimen switched to Seroquel. Recommendations to continue Lexapro. Updated recommendations for no inpatient psychiatry admission. Continue Lexapro and Seroquel on discharge.  Dehydration Secondary to poor oral intake. History of Zinker's diverticulum with previous fundoplication. Per chart review, family did not wish for further management.  Possibly syncopal event Possibly orthostatic related.  CKD stage III Baseline creatinine of 1.2. Does not appear to meet criteria for AKI.  Essential hypertension Somewhat labile. Blood pressure improved overall. Discharge on Lisinopril 40 mg daily, Coreg 6.25 mg BID and resume nifedipine 30 mg daily  Hypothyroidism Patient with a recent TSH of 82.5 on 04/17/2020. TSH now trended down to 24.9. Continue Synthroid 112 mcg daily  Eye irritation Eye drops prescribed with improvement.  Back pain Chronic issue. Patient states she knows how to deal with it.  Discharge Diagnoses:  Principal Problem:   Advanced dementia ([place]) Active Problems:   Altered mental status   Failure to thrive in adult   Dysphagia   Dementia with behavioral disturbance ([place])   Esophageal hernia   Palliative care encounter   DNR (do not resuscitate)    Discharge Instructions   Allergies as of 05/09/2020   No Known Allergies     Medication List    TAKE these medications   atorvastatin 40 MG tablet Commonly known as: LIPITOR Take 40 mg by mouth daily.  carvedilol 6.25 MG tablet Commonly known as: COREG Take 1 tablet (6.25 mg total) by mouth 2 (two) times  daily with a meal. What changed:   medication strength  how much to take   cholecalciferol 25 MCG (1000 UNIT) tablet Commonly known as: VITAMIN D3 Take 1,000 Units by mouth daily.   escitalopram 20 MG tablet Commonly known as: LEXAPRO Take 20 mg by mouth daily.   ferrous sulfate 324 MG Tbec Take 324 mg by mouth.   levothyroxine 112 MCG tablet Commonly known as: SYNTHROID Take 112 mcg by mouth daily before breakfast.   lisinopril 40 MG tablet Commonly known as: ZESTRIL Take 40 mg by mouth daily.   LORazepam 0.5 MG tablet Commonly known as: ATIVAN Take 1 tablet (0.5 mg total) by mouth 2 (two) times daily as needed for anxiety or sleep.   multivitamin with minerals Tabs tablet Take 1 tablet by mouth daily.   NIFEdipine 30 MG 24 hr tablet Commonly known as: ADALAT CC Take 30 mg by mouth daily.   ondansetron 8 MG tablet Commonly known as: ZOFRAN Take 8 mg by mouth every 8 (eight) hours as needed for nausea or vomiting.   pantoprazole 40 MG tablet Commonly known as: PROTONIX Take 40 mg by mouth daily.   QUEtiapine 25 MG tablet Commonly known as: SEROQUEL Take 1 tablet (25 mg total) by mouth in the morning AND 2 tablets (50 mg total) at bedtime.   vitamin B-12 500 MCG tablet Commonly known as: CYANOCOBALAMIN Take 500 mcg by mouth daily.       No Known Allergies  Consultations:  Palliative care medicine  Psychiatry   Procedures/Studies: CT Head Wo Contrast  Result Date: 04/15/2020 CLINICAL DATA:  Altered mental status. EXAM: CT HEAD WITHOUT CONTRAST TECHNIQUE: Contiguous axial images were obtained from the base of the skull through the vertex without intravenous contrast. COMPARISON:  None. FINDINGS: Brain: Mild diffuse cerebral atrophy with ex vacuo dilatation. Scattered and confluent periventricular and deep white matter hypodensities are nonspecific however commonly associated with chronic microvascular ischemic changes. No midline shift. No mass lesion.  No extra-axial fluid collection. No acute infarct or intracranial hemorrhage. Motion artifact limits evaluation of the vertex. Vascular: No hyperdense vessel. Bilateral carotid siphon atherosclerotic calcifications. Skull: Negative for fracture or focal lesion. Sinuses/Orbits: Normal orbits. Layering bilateral sphenoid sinus secretions. No mastoid effusion. Other: None. IMPRESSION: No acute intracranial process. Mild cerebral atrophy and chronic microvascular ischemic changes. Layering sphenoid sinus secretions may reflect active sinus disease. Electronically Signed   By: Stana Bunting M.D.   On: 04/15/2020 13:40   MR BRAIN WO CONTRAST  Result Date: 04/24/2020 CLINICAL DATA:  Encephalopathy.  Dementia EXAM: MRI HEAD WITHOUT CONTRAST TECHNIQUE: Multiplanar, multiecho pulse sequences of the brain and surrounding structures were obtained without intravenous contrast. COMPARISON:  Head CT from 9 days ago FINDINGS: Despite premedication only diffusion imaging could be acquired, and this sequence is motion degraded. Negative for infarct, hydrocephalus, or midline shift. IMPRESSION: 1. Only motion diffusion imaging could be acquired due to patient condition. 2. No acute infarct. Electronically Signed   By: Marnee Spring M.D.   On: 04/24/2020 12:25   DG Chest Portable 1 View  Result Date: 04/15/2020 CLINICAL DATA:  Altered mental status. EXAM: PORTABLE CHEST 1 VIEW COMPARISON:  None. FINDINGS: Telemetry wires overlie the upper and left thorax. Bibasilar streaky opacities. No pneumothorax or pleural effusion. Aortic atherosclerotic calcifications. Cardiomediastinal silhouette is within normal limits of size. Multilevel spondylosis and left glenohumeral osteoarthrosis. IMPRESSION: Bibasilar  opacities, likely atelectasis. Electronically Signed   By: Stana Bunting M.D.   On: 04/15/2020 13:35   EEG adult  Result Date: 04/25/2020 Charlsie Quest, MD     04/25/2020  2:59 PM Patient Name: Kristin Grimes MRN: [id number] Epilepsy Attending: Charlsie Quest Referring Physician/Provider: Dr. Lanae Boast Date: 04/25/2020 Duration: 25.26 minutes Patient history: 79 year old female with advanced dementia presented with altered mental status.  EEG evaluate for seizures. Level of alertness: Awake AEDs during EEG study: Depakote Technical aspects: This EEG study was done with scalp electrodes positioned according to the 10-20 International system of electrode placement. Electrical activity was acquired at a sampling rate of 500Hz  and reviewed with a high frequency filter of 70Hz  and a low frequency filter of 1Hz . EEG data were recorded continuously and digitally stored. Description: No posterior dominant rhythm was seen.  EEG showed continuous generalized 3 to 5 Hz theta-delta slowing.  Hyperventilation and photic stimulation were not performed.   Of note, EEG was technically difficult due to significant movement artifact as patient was unable to cooperate. ABNORMALITY -Continuous slow, generalized IMPRESSION: This technically difficult study is suggestive of moderate diffuse encephalopathy, nonspecific. No seizures or epileptiform discharges were seen throughout the recording. Priyanka   DG ESOPHAGUS W SINGLE CM (SOL OR THIN BA)  Result Date: 04/18/2020 CLINICAL DATA:  79 year old female with history of prior esophageal hernia repair. EXAM: ESOPHOGRAM/BARIUM SWALLOW TECHNIQUE: Single contrast examination was performed using thin barium or water soluble. FLUOROSCOPY TIME:  Fluoroscopy Time:  54 seconds Radiation Exposure Index (if provided by the fluoroscopic device): 5.1 mGy Number of Acquired Spot Images: 0 COMPARISON:  None. FINDINGS: Comment: Study was limited by lack of patient mobility and dementia. Limited single contrast imaging was performed. 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 Esophageal  motility was diffusely abnormal with failure to propagate normal primary peristaltic waves. No tertiary contractions were identified. No definite esophageal mass or stricture identified. Postoperative changes at the gastroesophageal junction related to prior fundoplication procedure noted. Contrast readily traversed into the stomach. No extravasation of contrast material. IMPRESSION: 1. Postoperative changes of prior fundoplication. 2. Nonspecific esophageal motility disorder. 3. 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 Electronically Signed   By: Annabelle Harman M.D.   On: 04/18/2020 11:35      Subjective: No issues.  Discharge Exam: Vitals:   05/09/20 0635 05/09/20 0825  BP: (!) 141/114 (!) 149/109  Pulse: 69 97  Resp: 18   Temp: 98 F (36.7 C)   SpO2: 100%    Vitals:   05/08/20 1845 05/08/20 2205 05/09/20 0635 05/09/20 0825  BP: (!) 163/79 105/73 (!) 141/114 (!) 149/109  Pulse: 84 81 69 97  Resp:  17 18   Temp:  98.6 F (37 C) 98 F (36.7 C)   TempSrc:  Oral    SpO2:  94% 100%   Weight:      Height:        General: Pt is alert, awake, not in acute distress Cardiovascular: RRR, S1/S2 +, no rubs, no gallops Respiratory: CTA bilaterally, no wheezing, no rhonchi Abdominal: Soft, NT, ND, bowel sounds +   The results of significant diagnostics from this hospitalization (including imaging, microbiology, ancillary and laboratory) are listed below for reference.     Microbiology: Recent Results (from the past 240 hour(s))  SARS CORONAVIRUS 2 (TAT 6-24 HRS) Nasopharyngeal  Nasopharyngeal Swab     Status: None   Collection Time: 05/06/20  6:08 PM   Specimen: Nasopharyngeal Swab  Result Value Ref Range Status   SARS Coronavirus 2 NEGATIVE NEGATIVE Final    Comment: (NOTE) SARS-CoV-2 target nucleic acids are NOT DETECTED.  The SARS-CoV-2 RNA is generally detectable in upper and  lower respiratory specimens during the acute phase of infection. Negative results do not preclude SARS-CoV-2 infection, do not rule out co-infections with other pathogens, and should not be used as the sole basis for treatment or other patient management decisions. Negative results must be combined with clinical observations, patient history, and epidemiological information. The expected result is Negative.  Fact Sheet for Patients: HairSlick.no  Fact Sheet for Healthcare Providers: quierodirigir.com  This test is not yet approved or cleared by the [place] FDA and  has been authorized for detection and/or diagnosis of SARS-CoV-2 by FDA under an Emergency Use Authorization (EUA). This EUA will remain  in effect (meaning this test can be used) for the duration of the COVID-19 declaration under Se ction 564(b)(1) of the Act, 21 U.S.C. section 360bbb-3(b)(1), unless the authorization is terminated or revoked sooner.  Performed at [facility], [address], [place], [place] [address]      Labs: BNP (last 3 results) No results for input(s): BNP in the last 8760 hours. Basic Metabolic Panel: No results for input(s): NA, K, CL, CO2, GLUCOSE, BUN, CREATININE, CALCIUM, MG, PHOS in the last 168 hours. Liver Function Tests: No results for input(s): AST, ALT, ALKPHOS, BILITOT, PROT, ALBUMIN in the last 168 hours. No results for input(s): LIPASE, AMYLASE in the last 168 hours. No results for input(s): AMMONIA in the last 168 hours. CBC: Recent Labs  Lab 05/06/20 0550  WBC 10.5  HGB 13.7  HCT 41.8  MCV 96.1  PLT 421*   Cardiac Enzymes: No results for input(s): CKTOTAL, CKMB, CKMBINDEX, TROPONINI in the last 168 hours. BNP: Invalid input(s): POCBNP CBG: No results for input(s): GLUCAP in the last 168 hours. D-Dimer No results for input(s): DDIMER in the last 72 hours. Hgb A1c No results for input(s): HGBA1C in  the last 72 hours. Lipid Profile No results for input(s): CHOL, HDL, LDLCALC, TRIG, CHOLHDL, LDLDIRECT in the last 72 hours. Thyroid function studies No results for input(s): TSH, T4TOTAL, T3FREE, THYROIDAB in the last 72 hours.  Invalid input(s): FREET3 Anemia work up No results for input(s): VITAMINB12, FOLATE, FERRITIN, TIBC, IRON, RETICCTPCT in the last 72 hours. Urinalysis    Component Value Date/Time   COLORURINE YELLOW 04/15/2020 2049   APPEARANCEUR CLEAR 04/15/2020 2049   LABSPEC 1.025 04/15/2020 2049   PHURINE 5.0 04/15/2020 2049   GLUCOSEU NEGATIVE 04/15/2020 2049   HGBUR MODERATE (A) 04/15/2020 2049   BILIRUBINUR NEGATIVE 04/15/2020 2049   KETONESUR 20 (A) 04/15/2020 2049   PROTEINUR 100 (A) 04/15/2020 2049   NITRITE NEGATIVE 04/15/2020 2049   LEUKOCYTESUR NEGATIVE 04/15/2020 2049   Sepsis Labs Invalid input(s): PROCALCITONIN,  WBC,  LACTICIDVEN Microbiology Recent Results (from the past 240 hour(s))  SARS CORONAVIRUS 2 (TAT 6-24 HRS) Nasopharyngeal Nasopharyngeal Swab     Status: None   Collection Time: 05/06/20  6:08 PM   Specimen: Nasopharyngeal Swab  Result Value Ref Range Status   SARS Coronavirus 2 NEGATIVE NEGATIVE Final    Comment: (NOTE) SARS-CoV-2 target nucleic acids are NOT DETECTED.  The SARS-CoV-2 RNA is generally detectable in upper and lower respiratory specimens during the acute phase of infection. Negative results  do not preclude SARS-CoV-2 infection, do not rule out co-infections with other pathogens, and should not be used as the sole basis for treatment or other patient management decisions. Negative results must be combined with clinical observations, patient history, and epidemiological information. The expected result is Negative.  Fact Sheet for Patients: HairSlick.no  Fact Sheet for Healthcare Providers: quierodirigir.com  This test is not yet approved or cleared by the [place] FDA and  has been authorized for detection and/or diagnosis of SARS-CoV-2 by FDA under an Emergency Use Authorization (EUA). This EUA will remain  in effect (meaning this test can be used) for the duration of the COVID-19 declaration under Se ction 564(b)(1) of the Act, 21 U.S.C. section 360bbb-3(b)(1), unless the authorization is terminated or revoked sooner.  Performed at [facility], [address], [place], [place] [address]      Time coordinating discharge: 35 minutes  SIGNED:   Jacquelin Hawking, MD [facility] 05/09/2020, 12:33 PM

## 2020-05-09 NOTE — TOC Transition Note (Addendum)
Transition of Care [facility]) - CM/SW Discharge Note   Patient Details  Name: Kristin Grimes MRN: [id number] Date of Birth: [birth year]  Transition of Care [facility]) CM/SW Contact:  Bartholome Bill, RN Phone Number: 05/09/2020, 2:08 PM   Clinical Narrative:     Pt to dc to [facility] today. Outpatient palliative referral sent to [facility]. She will transport via [facility]. Yellow DNR and scripts on chart for DC. RN to call report to [phone]. Pt going to room 20A.

## 2020-05-19 ENCOUNTER — Observation Stay (HOSPITAL_COMMUNITY): Payer: Medicare PPO

## 2020-05-19 ENCOUNTER — Encounter (HOSPITAL_COMMUNITY): Payer: Self-pay

## 2020-05-19 ENCOUNTER — Inpatient Hospital Stay (HOSPITAL_COMMUNITY)
Admission: EM | Admit: 2020-05-19 | Discharge: 2020-05-24 | DRG: 683 | Disposition: A | Discharge status: Hospice | Payer: Medicare PPO | Source: Skilled Nursing Facility | Attending: Internal Medicine | Admitting: Internal Medicine

## 2020-05-19 ENCOUNTER — Emergency Department (HOSPITAL_COMMUNITY): Payer: Medicare PPO

## 2020-05-19 ENCOUNTER — Other Ambulatory Visit: Payer: Self-pay

## 2020-05-19 DIAGNOSIS — Z87891 Personal history of nicotine dependence: Secondary | ICD-10-CM

## 2020-05-19 DIAGNOSIS — Z79890 Hormone replacement therapy (postmenopausal): Secondary | ICD-10-CM

## 2020-05-19 DIAGNOSIS — E872 Acidosis: Secondary | ICD-10-CM | POA: Diagnosis present

## 2020-05-19 DIAGNOSIS — R531 Weakness: Secondary | ICD-10-CM

## 2020-05-19 DIAGNOSIS — I959 Hypotension, unspecified: Secondary | ICD-10-CM | POA: Diagnosis present

## 2020-05-19 DIAGNOSIS — N1831 Chronic kidney disease, stage 3a: Secondary | ICD-10-CM | POA: Diagnosis present

## 2020-05-19 DIAGNOSIS — Z79899 Other long term (current) drug therapy: Secondary | ICD-10-CM

## 2020-05-19 DIAGNOSIS — N179 Acute kidney failure, unspecified: Secondary | ICD-10-CM | POA: Diagnosis not present

## 2020-05-19 DIAGNOSIS — R9389 Abnormal findings on diagnostic imaging of other specified body structures: Secondary | ICD-10-CM

## 2020-05-19 DIAGNOSIS — Z20822 Contact with and (suspected) exposure to covid-19: Secondary | ICD-10-CM | POA: Diagnosis present

## 2020-05-19 DIAGNOSIS — K219 Gastro-esophageal reflux disease without esophagitis: Secondary | ICD-10-CM | POA: Diagnosis present

## 2020-05-19 DIAGNOSIS — F03918 Unspecified dementia, unspecified severity, with other behavioral disturbance: Secondary | ICD-10-CM | POA: Diagnosis present

## 2020-05-19 DIAGNOSIS — E785 Hyperlipidemia, unspecified: Secondary | ICD-10-CM | POA: Diagnosis present

## 2020-05-19 DIAGNOSIS — W050XXA Fall from non-moving wheelchair, initial encounter: Secondary | ICD-10-CM | POA: Diagnosis present

## 2020-05-19 DIAGNOSIS — F0391 Unspecified dementia with behavioral disturbance: Secondary | ICD-10-CM | POA: Diagnosis present

## 2020-05-19 DIAGNOSIS — Y92128 Other place in nursing home as the place of occurrence of the external cause: Secondary | ICD-10-CM

## 2020-05-19 DIAGNOSIS — E039 Hypothyroidism, unspecified: Secondary | ICD-10-CM | POA: Diagnosis present

## 2020-05-19 DIAGNOSIS — Z7189 Other specified counseling: Secondary | ICD-10-CM

## 2020-05-19 DIAGNOSIS — W19XXXA Unspecified fall, initial encounter: Secondary | ICD-10-CM

## 2020-05-19 DIAGNOSIS — Z66 Do not resuscitate: Secondary | ICD-10-CM | POA: Diagnosis present

## 2020-05-19 DIAGNOSIS — F4310 Post-traumatic stress disorder, unspecified: Secondary | ICD-10-CM | POA: Diagnosis present

## 2020-05-19 DIAGNOSIS — Z515 Encounter for palliative care: Secondary | ICD-10-CM | POA: Diagnosis present

## 2020-05-19 DIAGNOSIS — B9620 Unspecified Escherichia coli [E. coli] as the cause of diseases classified elsewhere: Secondary | ICD-10-CM | POA: Diagnosis present

## 2020-05-19 DIAGNOSIS — R0602 Shortness of breath: Secondary | ICD-10-CM

## 2020-05-19 DIAGNOSIS — I1310 Hypertensive heart and chronic kidney disease without heart failure, with stage 1 through stage 4 chronic kidney disease, or unspecified chronic kidney disease: Secondary | ICD-10-CM | POA: Diagnosis present

## 2020-05-19 LAB — CBC WITH DIFFERENTIAL/PLATELET
Abs Immature Granulocytes: 0.08 10*3/uL — ABNORMAL HIGH (ref 0.00–0.07)
Basophils Absolute: 0 10*3/uL (ref 0.0–0.1)
Basophils Relative: 1 %
Eosinophils Absolute: 0.1 10*3/uL (ref 0.0–0.5)
Eosinophils Relative: 1 %
HCT: 42.7 % (ref 36.0–46.0)
Hemoglobin: 13.5 g/dL (ref 12.0–15.0)
Immature Granulocytes: 1 %
Lymphocytes Relative: 19 %
Lymphs Abs: 1.4 10*3/uL (ref 0.7–4.0)
MCH: 31.5 pg (ref 26.0–34.0)
MCHC: 31.6 g/dL (ref 30.0–36.0)
MCV: 99.5 fL (ref 80.0–100.0)
Monocytes Absolute: 1.1 10*3/uL — ABNORMAL HIGH (ref 0.1–1.0)
Monocytes Relative: 14 %
Neutro Abs: 4.9 10*3/uL (ref 1.7–7.7)
Neutrophils Relative %: 64 %
Platelets: 404 10*3/uL — ABNORMAL HIGH (ref 150–400)
RBC: 4.29 MIL/uL (ref 3.87–5.11)
RDW: 14.9 % (ref 11.5–15.5)
WBC: 7.6 10*3/uL (ref 4.0–10.5)
nRBC: 0 % (ref 0.0–0.2)

## 2020-05-19 LAB — COMPREHENSIVE METABOLIC PANEL
ALT: 25 U/L (ref 0–44)
AST: 39 U/L (ref 15–41)
Albumin: 3.2 g/dL — ABNORMAL LOW (ref 3.5–5.0)
Alkaline Phosphatase: 68 U/L (ref 38–126)
Anion gap: 10 (ref 5–15)
BUN: 62 mg/dL — ABNORMAL HIGH (ref 8–23)
CO2: 28 mmol/L (ref 22–32)
Calcium: 9.8 mg/dL (ref 8.9–10.3)
Chloride: 109 mmol/L (ref 98–111)
Creatinine, Ser: 1.84 mg/dL — ABNORMAL HIGH (ref 0.44–1.00)
GFR calc Af Amer: 30 mL/min — ABNORMAL LOW (ref >60)
GFR calc non Af Amer: 26 mL/min — ABNORMAL LOW (ref >60)
Glucose, Bld: 107 mg/dL — ABNORMAL HIGH (ref 70–99)
Potassium: 4.8 mmol/L (ref 3.5–5.1)
Sodium: 147 mmol/L — ABNORMAL HIGH (ref 135–145)
Total Bilirubin: 0.9 mg/dL (ref 0.3–1.2)
Total Protein: 7 g/dL (ref 6.5–8.1)

## 2020-05-19 LAB — SARS CORONAVIRUS 2 BY RT PCR (HOSPITAL ORDER, PERFORMED IN ~~LOC~~ HOSPITAL LAB): SARS Coronavirus 2: NEGATIVE

## 2020-05-19 LAB — CBG MONITORING, ED: Glucose-Capillary: 98 mg/dL (ref 70–99)

## 2020-05-19 MED ORDER — ONDANSETRON HCL 4 MG/2ML IJ SOLN
4.0000 mg | Freq: Four times a day (QID) | INTRAMUSCULAR | Status: DC | PRN
  Administered 2020-05-22: 4 mg via INTRAVENOUS
  Filled 2020-05-19: qty 2

## 2020-05-19 MED ORDER — ACETAMINOPHEN 650 MG RE SUPP: 650.0000 mg | Freq: Four times a day (QID) | RECTAL | Status: DC | PRN

## 2020-05-19 MED ORDER — QUETIAPINE FUMARATE 25 MG PO TABS
25.0000 mg | ORAL_TABLET | Freq: Every day | ORAL | Status: DC
  Administered 2020-05-20 – 2020-05-21 (×2): 25 mg via ORAL
  Filled 2020-05-19 (×2): qty 1

## 2020-05-19 MED ORDER — ENOXAPARIN SODIUM 30 MG/0.3ML ~~LOC~~ SOLN
30.0000 mg | SUBCUTANEOUS | Status: DC
  Administered 2020-05-20: 30 mg via SUBCUTANEOUS
  Filled 2020-05-19: qty 0.3

## 2020-05-19 MED ORDER — NIFEDIPINE ER OSMOTIC RELEASE 30 MG PO TB24
30.0000 mg | ORAL_TABLET | Freq: Every day | ORAL | Status: DC
  Administered 2020-05-20: 30 mg via ORAL
  Filled 2020-05-19: qty 1

## 2020-05-19 MED ORDER — LORAZEPAM 2 MG/ML IJ SOLN
0.5000 mg | Freq: Once | INTRAMUSCULAR | Status: AC
  Administered 2020-05-19: 0.5 mg via INTRAVENOUS
  Filled 2020-05-19: qty 1

## 2020-05-19 MED ORDER — POLYETHYLENE GLYCOL 3350 17 G PO PACK
17.0000 g | PACK | Freq: Every day | ORAL | Status: DC
  Administered 2020-05-20 – 2020-05-24 (×4): 17 g via ORAL
  Filled 2020-05-19 (×5): qty 1

## 2020-05-19 MED ORDER — QUETIAPINE FUMARATE 100 MG PO TABS: 100.0000 mg | ORAL_TABLET | Freq: Every day | ORAL | Status: DC

## 2020-05-19 MED ORDER — ACETAMINOPHEN 325 MG PO TABS
650.0000 mg | ORAL_TABLET | Freq: Four times a day (QID) | ORAL | Status: DC | PRN
  Administered 2020-05-22: 650 mg via ORAL
  Filled 2020-05-19: qty 2

## 2020-05-19 MED ORDER — CARVEDILOL 6.25 MG PO TABS
6.2500 mg | ORAL_TABLET | Freq: Two times a day (BID) | ORAL | Status: DC
  Administered 2020-05-20: 6.25 mg via ORAL
  Filled 2020-05-19: qty 1

## 2020-05-19 MED ORDER — LORAZEPAM 0.5 MG PO TABS
0.5000 mg | ORAL_TABLET | Freq: Two times a day (BID) | ORAL | Status: DC | PRN
  Administered 2020-05-20: 0.5 mg via ORAL
  Filled 2020-05-19: qty 1

## 2020-05-19 MED ORDER — SODIUM CHLORIDE 0.9 % IV BOLUS
1000.0000 mL | Freq: Once | INTRAVENOUS | Status: AC
  Administered 2020-05-19: 1000 mL via INTRAVENOUS

## 2020-05-19 MED ORDER — ALBUTEROL SULFATE (2.5 MG/3ML) 0.083% IN NEBU: 2.5000 mg | INHALATION_SOLUTION | RESPIRATORY_TRACT | Status: DC | PRN

## 2020-05-19 MED ORDER — SODIUM CHLORIDE (PF) 0.9 % IJ SOLN
INTRAMUSCULAR | Status: AC
  Filled 2020-05-19: qty 50

## 2020-05-19 MED ORDER — LEVOTHYROXINE SODIUM 112 MCG PO TABS
112.0000 ug | ORAL_TABLET | Freq: Every day | ORAL | Status: DC
  Administered 2020-05-20 – 2020-05-21 (×2): 112 ug via ORAL
  Filled 2020-05-19 (×2): qty 1

## 2020-05-19 MED ORDER — ATORVASTATIN CALCIUM 40 MG PO TABS
40.0000 mg | ORAL_TABLET | Freq: Every day | ORAL | Status: DC
  Administered 2020-05-20 – 2020-05-23 (×4): 40 mg via ORAL
  Filled 2020-05-19 (×4): qty 1

## 2020-05-19 MED ORDER — LORAZEPAM 1 MG/0.5ML PO CONC: 1.0000 mg | Freq: Four times a day (QID) | ORAL | Status: DC | PRN

## 2020-05-19 MED ORDER — QUETIAPINE FUMARATE 100 MG PO TABS
100.0000 mg | ORAL_TABLET | Freq: Every day | ORAL | Status: DC
  Administered 2020-05-20 – 2020-05-22 (×3): 100 mg via ORAL
  Filled 2020-05-19 (×4): qty 1

## 2020-05-19 MED ORDER — PANTOPRAZOLE SODIUM 40 MG PO TBEC
40.0000 mg | DELAYED_RELEASE_TABLET | Freq: Every day | ORAL | Status: DC
  Administered 2020-05-20 – 2020-05-24 (×5): 40 mg via ORAL
  Filled 2020-05-19 (×5): qty 1

## 2020-05-19 MED ORDER — ESCITALOPRAM OXALATE 20 MG PO TABS
20.0000 mg | ORAL_TABLET | Freq: Every day | ORAL | Status: DC
  Administered 2020-05-20 – 2020-05-21 (×2): 20 mg via ORAL
  Filled 2020-05-19 (×2): qty 1

## 2020-05-19 MED ORDER — ONDANSETRON HCL 4 MG PO TABS
4.0000 mg | ORAL_TABLET | Freq: Four times a day (QID) | ORAL | Status: DC | PRN
  Administered 2020-05-20: 4 mg via ORAL
  Filled 2020-05-19: qty 1

## 2020-05-19 MED ORDER — QUETIAPINE FUMARATE 50 MG PO TABS: 25.0000 mg | ORAL_TABLET | Freq: Every day | ORAL | Status: DC

## 2020-05-19 MED ORDER — SODIUM CHLORIDE 0.9 % IV SOLN: INTRAVENOUS | Status: DC

## 2020-05-19 MED ORDER — FERROUS SULFATE 325 (65 FE) MG PO TABS
324.0000 mg | ORAL_TABLET | Freq: Every day | ORAL | Status: DC
  Administered 2020-05-20 – 2020-05-24 (×5): 324 mg via ORAL
  Filled 2020-05-19 (×5): qty 1

## 2020-05-19 NOTE — H&P (Signed)
History and Physical  Kristin Grimes RUE:[id number] DOB: 1941/05/30 DOA: 05/19/2020  PCP: Patient, No Pcp Per   Patient coming from:[facility] (private).    Chief Complaint:Fall  HPI: Kristin Grimes is a 79 y.o. female with medical history significant for advanced dementia on Ativan, Seroquel, Celexa, recent admission and discharge on June/28 for dehydration/altered mental status/possible syncopal event, CKD stage IIIa Baseline creatinine around 1, hypertension on nifedipine, lisinopril, Coreg, hypothyroidism, hyperlipidemia found on the floor beside the WC at the nursing home and sent to the ED.On previous admission patient was seen by palliative care for, prognosis was felt to be guarded if she had ongoing altered mental status.Patient and husband had recently moved from [place] and she had some baseline dementia there has been going downhill since.  ED Course:Initially 80% on RA and was placed on nasal cannula oxygen. Chest x-ray showed probable nondisplaced left-sided rib fractures with a questionable basilar left-sided pneumothorax evaluation limited by supine technique and CT was recommended.ED after discussion with radiology performed CT head CT chest CT abdomen pelvis which is limited by motion artifact lack of IV contrast given AKI but no left-sided pneumothorax no definite displaced left-sided rib fracture but evaluation was limited. [place] thoracic aortic aneurysm.It showed AKI on CKD given poor po intake admission was requested. On my exam patient is on room air, eyes closed briefly opens mumble words, when asked her name "I do not remember", does not follow and instruction.No family at the bedside. DNR form reviewed at [employer]. COVID screening pending. I did discuss with patient's daughter over the phone.She says she has been somewhat aggressive,does not want to be touched sometime. Apparently patient has been on the wheelchair next to the nursing station last  time when family visited. She is at private memory care unit.   Review of Systems: Unable to obtain full review of system due to patient's dementia. Past Medical History:  Diagnosis Date  . Hypothyroidism    No past surgical history on file.   reports that she has quit smoking. Her smoking use included cigarettes. She has never used smokeless tobacco. She reports current alcohol use. She reports that she does not use drugs.  No Known Allergies  No family history on file.  Prior to Admission medications   Medication Sig Start Date End Date Taking? Authorizing Provider  acetaminophen (TYLENOL) 325 MG tablet Take 650 mg by mouth every 8 (eight) hours. (0600, 1400, & 2200)   Yes [provider]  acetaminophen (TYLENOL) 325 MG tablet Take 650 mg by mouth every 6 (six) hours as needed for mild pain, moderate pain or fever.   Yes [provider]  atorvastatin (LIPITOR) 40 MG tablet Take 40 mg by mouth at bedtime. (2000)   Yes [provider]  carvedilol (COREG) 6.25 MG tablet Take 1 tablet (6.25 mg total) by mouth 2 (two) times daily with a meal. Patient taking differently: Take 6.25 mg by mouth 2 (two) times daily with a meal. (0800 & 2000) 05/09/20 06/08/20 Yes Narda Bonds, MD  cholecalciferol (VITAMIN D3) 25 MCG (1000 UNIT) tablet Take 1,000 Units by mouth daily. (0800)   Yes [provider]  escitalopram (LEXAPRO) 20 MG tablet Take 20 mg by mouth daily. (0800)   Yes [provider]  ferrous sulfate 324 MG TBEC Take 324 mg by mouth. (0800)   Yes [provider]  levothyroxine (SYNTHROID) 112 MCG tablet Take 112 mcg by mouth daily before breakfast. (0800)   Yes [provider]  lisinopril (ZESTRIL) 40 MG tablet Take 40 mg by mouth daily. (0800)   Yes [provider]  LORazepam (ATIVAN) 0.5 MG tablet Take 1 tablet (0.5 mg total) by mouth 2 (two) times daily as needed for anxiety or sleep. 05/09/20  Yes Narda Bonds, MD    LORazepam 1 MG/0.5ML CONC Take 1 mg by mouth every 6 (six) hours as needed (anxiety). Pre-filled syringes   Yes [provider]  Morphine Sulfate (MORPHINE CONCENTRATE) 10 mg / 0.5 ml concentrated solution Take 10 mg by mouth every 4 (four) hours as needed for shortness of breath (air hunger).   Yes [provider]  Multiple Vitamin (MULTIVITAMIN WITH MINERALS) TABS tablet Take 1 tablet by mouth daily. (0800)   Yes [provider]  NIFEdipine (ADALAT CC) 30 MG 24 hr tablet Take 30 mg by mouth daily. (0800)   Yes [provider]  ondansetron (ZOFRAN) 8 MG tablet Take 8 mg by mouth every 8 (eight) hours as needed for nausea or vomiting.   Yes [provider]  pantoprazole (PROTONIX) 40 MG tablet Take 40 mg by mouth daily. (0800)   Yes [provider]  polyethylene glycol (MIRALAX / GLYCOLAX) 17 g packet Take 17 g by mouth daily. Mix in 8 ounces of fluid. (0800)   Yes [provider]  QUEtiapine (SEROQUEL) 25 MG tablet Take 25-50 mg by mouth See admin instructions. Take 1 tablet (25mg ) by mouth daily in the morning (0800) and take 2 tablets (100mg ) by mouth daily in the evening (2000).   Yes [provider]  vitamin B-12 (CYANOCOBALAMIN) 500 MCG tablet Take 500 mcg by mouth daily. (0800)   Yes [provider]   Physical Exam: Vitals:   05/19/20 1317 05/19/20 1536  BP: (!) 146/96   Pulse: (!) 59   Resp: 20   Temp: 97.9 F (36.6 C) (!) 97.5 F (36.4 C)  TempSrc: Oral Rectal  SpO2: 98%    General exam: Eyes closed mumbling words, do not follow instruction, on room air,  HEENT:Oral mucosa moist,Ear/Nose WNL grossly, dentition normal. Respiratory system:Bilaterally clear breath sounds no significant tenderness no use of accessory muscles Cardiovascular system:S1 & S2 +, No JVD,. Gastrointestinal system: Abdomen soft, NT,ND, BS+ Nervous System: mumbles words to not follow instruction confused,  Extremities:No edema,  distal peripheral pulses palpable.  Skin:No rashes,no icterus. CZY:SAYTKZ muscle bulk,tone, power   Labs on Admission: I have personally reviewed following labs and imaging studies  CBC: Recent Labs  Lab 05/19/20 1405  WBC 7.6  NEUTROABS 4.9  HGB 13.5  HCT 42.7  MCV 99.5  PLT 404*   Basic Metabolic Panel: Recent Labs  Lab 05/19/20 1405  NA 147*  K 4.8  CL 109  CO2 28  GLUCOSE 107*  BUN 62*  CREATININE 1.84*  CALCIUM 9.8   GFR: CrCl cannot be calculated (Unknown ideal weight.). Liver Function Tests: Recent Labs  Lab 05/19/20 1405  AST 39  ALT 25  ALKPHOS 68  BILITOT 0.9  PROT 7.0  ALBUMIN 3.2*   No results for input(s): LIPASE, AMYLASE in the last 168 hours. No results for input(s): AMMONIA in the last 168 hours. Coagulation Profile: No results for input(s): INR, PROTIME in the last 168 hours. Cardiac Enzymes: No results for input(s): CKTOTAL, CKMB, CKMBINDEX, TROPONINI in the last 168 hours. BNP (last 3 results) No results for input(s): PROBNP in the last 8760 hours. HbA1C: No results for input(s): HGBA1C in the last 72 hours.  CBG: Recent Labs  Lab 05/19/20 1400  GLUCAP 98   Lipid Profile: No results for input(s): CHOL, HDL, LDLCALC, TRIG, CHOLHDL, LDLDIRECT in the last 72 hours. Thyroid Function Tests: No results for input(s): TSH, T4TOTAL, FREET4, T3FREE, THYROIDAB in the last 72 hours. Anemia Panel: No results for input(s): VITAMINB12, FOLATE, FERRITIN, TIBC, IRON, RETICCTPCT in the last 72 hours. Urine analysis:    Component Value Date/Time   COLORURINE YELLOW 04/15/2020 2049   APPEARANCEUR CLEAR 04/15/2020 2049   LABSPEC 1.025 04/15/2020 2049   PHURINE 5.0 04/15/2020 2049   GLUCOSEU NEGATIVE 04/15/2020 2049   HGBUR MODERATE (A) 04/15/2020 2049   BILIRUBINUR NEGATIVE 04/15/2020 2049   KETONESUR 20 (A) 04/15/2020 2049   PROTEINUR 100 (A) 04/15/2020 2049   NITRITE NEGATIVE 04/15/2020 2049   LEUKOCYTESUR NEGATIVE 04/15/2020 2049     Radiological Exams on Admission: CT ABDOMEN PELVIS WO CONTRAST  Result Date: 05/19/2020 CLINICAL DATA:  Chest trauma. EXAM: CT CHEST, ABDOMEN AND PELVIS WITHOUT CONTRAST TECHNIQUE: Multidetector CT imaging of the chest, abdomen and pelvis was performed following the standard protocol without IV contrast. COMPARISON:  None. FINDINGS: CT CHEST FINDINGS Cardiovascular: The heart size is normal. The ascending aorta appears to be borderline aneurysmal measuring up to approximately 4 cm. There are atherosclerotic changes of the thoracic aorta and coronary arteries. There is no significant pericardial effusion. Mediastinum/Nodes: -- No mediastinal lymphadenopathy. -- No hilar lymphadenopathy. -- No axillary lymphadenopathy. -- No supraclavicular lymphadenopathy. -- Normal thyroid gland where visualized. -there is a large hiatal hernia. Lungs/Pleura: Examination of the lung fields is limited by significant respiratory motion artifact. There is no pneumothorax. No large pleural effusion. There is atelectasis at the lung bases. Musculoskeletal: Evaluation of the osseous structures is severely limited by extensive motion artifact. There is an age-indeterminate compression fracture of the T4 vertebral body. CT ABDOMEN PELVIS FINDINGS Hepatobiliary: Evaluation of the liver is limited by lack of IV contrast. There are indeterminate hypoattenuating lesions in the liver measuring up to approximately 3.4 cm. The gallbladder is mildly distended.There is no biliary ductal dilation. Pancreas: Normal contours without ductal dilatation. No peripancreatic fluid collection. Spleen: Unremarkable. Adrenals/Urinary Tract: --Adrenal glands: Unremarkable. --Right kidney/ureter: No hydronephrosis or radiopaque kidney stones. --Left kidney/ureter: No hydronephrosis or radiopaque kidney stones. --Urinary bladder: Unremarkable. Stomach/Bowel: --Stomach/Duodenum: There is a large hiatal hernia. --Small bowel: Unremarkable. --Colon: There is  scattered colonic diverticula without CT evidence for diverticulitis. --Appendix: Not visualized. No right lower quadrant inflammation or free fluid. Vascular/Lymphatic: Atherosclerotic calcification is present within the non-aneurysmal abdominal aorta, without hemodynamically significant stenosis. --No retroperitoneal lymphadenopathy. --No mesenteric lymphadenopathy. --No pelvic or inguinal lymphadenopathy. Reproductive: There is a fat containing left ovarian lesion measuring approximately 4.3 cm (axial series 3, image 73). Other: No ascites or free air. The abdominal wall is normal. Musculoskeletal. There are degenerative changes throughout the thoracolumbar spine. There is a degenerative dextroscoliosis of the lumbar spine. IMPRESSION: 1. Examination is severely limited by extensive motion artifact and lack of IV contrast. 2. No definite acute abnormality given the above limitations. Specifically, there is no left-sided pneumothorax as was identified on the patient's recent chest x-ray. Additionally, there is no definite displaced left-sided rib fracture, however evaluation is severely limited by motion artifact. 3. There are multiple indeterminate hypoattenuating masses in the liver. Further evaluation with a contrast enhanced CT or MRI is recommended when the patient's condition improves in the patient is able to follow commands and breathing instructions. 4. 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 Recommend  annual imaging followup by CTA or MRA. This recommendation follows 2010 ACCF/AHA/AATS/ACR/ASA/SCA/SCAI/SIR/STS/SVM Guidelines for the Diagnosis and Management of Patients with Thoracic Aortic Disease. Circulation. 2010; 121: W098-J191. Aortic aneurysm NOS (ICD10-I71.9) 5. Large hiatal hernia. 6. Diverticulosis without CT evidence for diverticulitis. 7. 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 8.  Aortic Atherosclerosis (ICD10-I70.0).  Aortic Atherosclerosis (ICD10-I70.0). Electronically Signed   By: Katherine Mantle M.D.   On: 05/19/2020 17:16   CT HEAD WO CONTRAST  Result Date: 05/19/2020 CLINICAL DATA:  Head trauma found on floor EXAM: CT HEAD WITHOUT CONTRAST TECHNIQUE: Contiguous axial images were obtained from the base of the skull through the vertex without intravenous contrast. COMPARISON:  Brain MRI 04/24/2020, CT brain 04/15/2020 FINDINGS: Brain: Motion degradation at the cranial vertex. No acute territorial infarction, hemorrhage or intracranial mass. Atrophy. Mild chronic small vessel ischemic change of the white matter. Stable ventricle size. Vascular: No hyperdense vessels. Scattered carotid vascular calcification. Skull: Normal. Negative for fracture or focal lesion. Sinuses/Orbits: Opacified right sphenoid sinus. Mucosal thickening in the ethmoid sinuses. Other: None IMPRESSION: 1. Limited evaluation at cranial vertex secondary to motion. 2. No definite CT evidence for acute intracranial abnormality. Atrophy and chronic small vessel ischemic change of the white matter 3. Interval sinus disease Electronically Signed   By: Jasmine Pang M.D.   On: 05/19/2020 17:11   CT Chest Wo Contrast  Result Date: 05/19/2020 CLINICAL DATA:  Chest trauma. EXAM: CT CHEST, ABDOMEN AND PELVIS WITHOUT CONTRAST TECHNIQUE: Multidetector CT imaging of the chest, abdomen and pelvis was performed following the standard protocol without IV contrast. COMPARISON:  None. FINDINGS: CT CHEST FINDINGS Cardiovascular: The heart size is normal. The ascending aorta appears to be borderline aneurysmal measuring up to approximately 4 cm. There are atherosclerotic changes of the thoracic aorta and coronary arteries. There is no significant pericardial effusion. Mediastinum/Nodes: -- No mediastinal lymphadenopathy. -- No hilar lymphadenopathy. -- No axillary lymphadenopathy. -- No supraclavicular lymphadenopathy. -- Normal thyroid gland where visualized. -there  is a large hiatal hernia. Lungs/Pleura: Examination of the lung fields is limited by significant respiratory motion artifact. There is no pneumothorax. No large pleural effusion. There is atelectasis at the lung bases. Musculoskeletal: Evaluation of the osseous structures is severely limited by extensive motion artifact. There is an age-indeterminate compression fracture of the T4 vertebral body. CT ABDOMEN PELVIS FINDINGS Hepatobiliary: Evaluation of the liver is limited by lack of IV contrast. There are indeterminate hypoattenuating lesions in the liver measuring up to approximately 3.4 cm. The gallbladder is mildly distended.There is no biliary ductal dilation. Pancreas: Normal contours without ductal dilatation. No peripancreatic fluid collection. Spleen: Unremarkable. Adrenals/Urinary Tract: --Adrenal glands: Unremarkable. --Right kidney/ureter: No hydronephrosis or radiopaque kidney stones. --Left kidney/ureter: No hydronephrosis or radiopaque kidney stones. --Urinary bladder: Unremarkable. Stomach/Bowel: --Stomach/Duodenum: There is a large hiatal hernia. --Small bowel: Unremarkable. --Colon: There is scattered colonic diverticula without CT evidence for diverticulitis. --Appendix: Not visualized. No right lower quadrant inflammation or free fluid. Vascular/Lymphatic: Atherosclerotic calcification is present within the non-aneurysmal abdominal aorta, without hemodynamically significant stenosis. --No retroperitoneal lymphadenopathy. --No mesenteric lymphadenopathy. --No pelvic or inguinal lymphadenopathy. Reproductive: There is a fat containing left ovarian lesion measuring approximately 4.3 cm (axial series 3, image 73). Other: No ascites or free air. The abdominal wall is normal. Musculoskeletal. There are degenerative changes throughout the thoracolumbar spine. There is a degenerative dextroscoliosis of the lumbar spine. IMPRESSION: 1. Examination is severely limited by extensive motion artifact and lack  of  IV contrast. 2. No definite acute abnormality given the above limitations. Specifically, there is no left-sided pneumothorax as was identified on the patient's recent chest x-ray. Additionally, there is no definite displaced left-sided rib fracture, however evaluation is severely limited by motion artifact. 3. There are multiple indeterminate hypoattenuating masses in the liver. Further evaluation with a contrast enhanced CT or MRI is recommended when the patient's condition improves in the patient is able to follow commands and breathing instructions. 4. 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 Recommend annual imaging followup by CTA or MRA. This recommendation follows 2010 ACCF/AHA/AATS/ACR/ASA/SCA/SCAI/SIR/STS/SVM Guidelines for the Diagnosis and Management of Patients with Thoracic Aortic Disease. Circulation. 2010; 121: J188-C166. Aortic aneurysm NOS (ICD10-I71.9) 5. Large hiatal hernia. 6. Diverticulosis without CT evidence for diverticulitis. 7. 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 8.  Aortic Atherosclerosis (ICD10-I70.0). Aortic Atherosclerosis (ICD10-I70.0). Electronically Signed   By: Katherine Mantle M.D.   On: 05/19/2020 17:16   DG Chest Portable 1 View  Result Date: 05/19/2020 CLINICAL DATA:  Low O2 sats. EXAM: PORTABLE CHEST 1 VIEW COMPARISON:  None. FINDINGS: There appear to be nondisplaced fractures involving the anterolateral sixth and seventh ribs on the left. There is a questionable small primarily basilar left-sided pneumothorax. The heart size is enlarged. Aortic calcifications are noted. There may be a small left-sided pleural effusion. The right-sided lung field appears clear. There are end-stage degenerative changes of the left glenohumeral joint. IMPRESSION: 1. Probable nondisplaced left-sided rib fractures with a questionable basilar left-sided pneumothorax. Evaluation is somewhat limited by supine  technique. Consider a short interval follow-up chest x-ray or CT for further evaluation. 2. Cardiomegaly. These results were called by telephone at the time of interpretation on 05/19/2020 at 3:13 pm to provider [facility] PATEL , who verbally acknowledged these results. Electronically Signed   By: Katherine Mantle M.D.   On: 05/19/2020 15:13    Assessment/Plan Active Problems:   AKI (acute kidney injury) (HCC)  ?Fall/fall next to her wheelchair at the nursing home: Trauma scan although limited by motion artifact no iv contrast 2/2 aki- has no acute findings although initial concern for pneumothorax in CXR as she was apparently hypoxic- in ED no more hypoxic, on RA currently.Will repeat chest x-ray in the morning if she allows, monitor oxygen overnight.   AKI on CKD stage IIIa Baseline creatinine around 1: With some decreased intake per report.  BUN significantly elevated at 60 creatinine 1.8-we will keep on IV fluid hydration, hold patient's lisinopril, repeat labs in the morning.  Advance dementia/recent admission : She is on Ativan, Seroquel, Celexa continued.  Keep on fall precaution, aspiration precaution.  It seems she is on dysphagia 1 diet from last discharge.  Essential hypertension: Blood pressure stable continue Coreg, nifedipine but hold lisinopril.  Hypothyroidism resume home Synthroid. Hyperlipidemia continue statin.  There is no height or weight on file to calculate BMI.   Severity of Illness:  I certify that at the point of admission it is my clinical judgment that the patient will require observation level of care spanning less than 2 midnight.   DVT prophylaxis:SCD/Lovenox Code Status:DNR- form available from last discharge.  Reviewed palliative care note. Family Communication: Admission, patients condition and plan of care including tests being ordered have been discussed with the ED physician. I did call patient's husband in his cell phone [phone],no answer, left msg.Also  called her daughter's no- and was able to speak to her.  Discussed about  plan for overnight hydration and return to memory care tomorrow if remains stable.  Consults called: None  Kristin Boast MD [facility]  If 7PM-7AM, please contact night-coverage [email or web address]  05/19/2020, 6:22 PM

## 2020-05-19 NOTE — ED Triage Notes (Signed)
05/19/2020  1257 EMS states patient was found on the floor beside the wheelchair. Pt was able to stand and pivot to stretcher with assistance. Per facility patient is at her baseline.

## 2020-05-19 NOTE — ED Provider Notes (Addendum)
[facility]-EMERGENCY DEPT Provider Note   CSN: [id number] Arrival date & time: 05/19/20  1248     History Chief Complaint  Patient presents with  . Fall    Kristin Grimes is a 79 y.o. female that presents to the emergency department today via EMS for being found down on the floor.  Patient is level 5 caveat and unable to tell anything due to advanced dementia.  Ws able to speak to [facility], pt's nursing facility, states that they have had the patient for about 1 week.  States that she most likely slid off her wheelchair and was found on the floor for about 5 minutes.  When they got to her she was awake and oriented.  They are unsure if she hit her head.  No blood thinners.  Patient is not complaining of any pain.  Patient is a DNR.  Per chart review, patient was recently admitted and discharged from the hospital 6/28 due to worsening mental status and combative nature with family at home.  Patient was discharged on Lexapro and Seroquel.  HPI     Past Medical History:  Diagnosis Date  . Hypothyroidism     Patient Active Problem List   Diagnosis Date Noted  . Esophageal hernia   . Palliative care encounter   . DNR (do not resuscitate)   . Dementia with behavioral disturbance (HCC) 04/21/2020  . Advanced dementia (HCC) 04/19/2020  . Altered mental status   . Failure to thrive in adult   . Dysphagia   . Acute metabolic encephalopathy 04/15/2020    No past surgical history on file.   OB History   No obstetric history on file.     No family history on file.  Social History   Tobacco Use  . Smoking status: Former Smoker    Types: Cigarettes  . Smokeless tobacco: Never Used  Vaping Use  . Vaping Use: Never used  Substance Use Topics  . Alcohol use: Yes    Comment: occasional  . Drug use: Never    Home Medications Prior to Admission medications   Medication Sig Start Date End Date Taking? Authorizing Provider  acetaminophen  (TYLENOL) 325 MG tablet Take 650 mg by mouth every 8 (eight) hours. (0600, 1400, & 2200)   Yes [provider]  acetaminophen (TYLENOL) 325 MG tablet Take 650 mg by mouth every 6 (six) hours as needed for mild pain, moderate pain or fever.   Yes [provider]  atorvastatin (LIPITOR) 40 MG tablet Take 40 mg by mouth at bedtime. (2000)   Yes [provider]  carvedilol (COREG) 6.25 MG tablet Take 1 tablet (6.25 mg total) by mouth 2 (two) times daily with a meal. Patient taking differently: Take 6.25 mg by mouth 2 (two) times daily with a meal. (0800 & 2000) 05/09/20 06/08/20 Yes Narda Bonds, MD  cholecalciferol (VITAMIN D3) 25 MCG (1000 UNIT) tablet Take 1,000 Units by mouth daily. (0800)   Yes [provider]  escitalopram (LEXAPRO) 20 MG tablet Take 20 mg by mouth daily. (0800)   Yes [provider]  ferrous sulfate 324 MG TBEC Take 324 mg by mouth. (0800)   Yes [provider]  levothyroxine (SYNTHROID) 112 MCG tablet Take 112 mcg by mouth daily before breakfast. (0800)   Yes [provider]  lisinopril (ZESTRIL) 40 MG tablet Take 40 mg by mouth daily. (0800)   Yes [provider]  LORazepam (ATIVAN) 0.5 MG tablet Take 1  tablet (0.5 mg total) by mouth 2 (two) times daily as needed for anxiety or sleep. 05/09/20  Yes Narda Bonds, MD  LORazepam 1 MG/0.5ML CONC Take 1 mg by mouth every 6 (six) hours as needed (anxiety). Pre-filled syringes   Yes [provider]  Morphine Sulfate (MORPHINE CONCENTRATE) 10 mg / 0.5 ml concentrated solution Take 10 mg by mouth every 4 (four) hours as needed for shortness of breath (air hunger).   Yes [provider]  Multiple Vitamin (MULTIVITAMIN WITH MINERALS) TABS tablet Take 1 tablet by mouth daily. (0800)   Yes [provider]  NIFEdipine (ADALAT CC) 30 MG 24 hr tablet Take 30 mg by mouth daily. (0800)   Yes [provider]  ondansetron (ZOFRAN) 8 MG  tablet Take 8 mg by mouth every 8 (eight) hours as needed for nausea or vomiting.   Yes [provider]  pantoprazole (PROTONIX) 40 MG tablet Take 40 mg by mouth daily. (0800)   Yes [provider]  polyethylene glycol (MIRALAX / GLYCOLAX) 17 g packet Take 17 g by mouth daily. Mix in 8 ounces of fluid. (0800)   Yes [provider]  QUEtiapine (SEROQUEL) 25 MG tablet Take 25-50 mg by mouth See admin instructions. Take 1 tablet (25mg ) by mouth daily in the morning (0800) and take 2 tablets (100mg ) by mouth daily in the evening (2000).   Yes [provider]  vitamin B-12 (CYANOCOBALAMIN) 500 MCG tablet Take 500 mcg by mouth daily. (0800)   Yes [provider]    Allergies    Patient has no known allergies.  Review of Systems   Review of Systems  Unable to perform ROS: Dementia    Physical Exam Updated Vital Signs BP (!) 146/96 (BP Location: Left Arm)   Pulse (!) 59   Temp (!) 97.5 F (36.4 C) (Rectal)   Resp 20   SpO2 98%   Physical Exam Constitutional:      General: She is not in acute distress.    Appearance: Normal appearance. She is not ill-appearing, toxic-appearing or diaphoretic.  HENT:     Head: Normocephalic and atraumatic.     Comments: No obvious deformities    Mouth/Throat:     Mouth: Mucous membranes are moist.     Pharynx: Oropharynx is clear.  Eyes:     General: No scleral icterus.    Pupils: Pupils are equal, round, and reactive to light.  Cardiovascular:     Rate and Rhythm: Normal rate and regular rhythm.     Pulses: Normal pulses.     Heart sounds: Normal heart sounds.  Pulmonary:     Effort: Pulmonary effort is normal. No respiratory distress.     Breath sounds: Normal breath sounds. No stridor. No wheezing, rhonchi or rales.     Comments: No step-offs or flail chest.  Does not complain of any chest pain when I push on her chest Chest:     Chest wall: No tenderness.  Abdominal:     General: Abdomen is flat.  There is no distension.     Palpations: Abdomen is soft.     Tenderness: There is no abdominal tenderness. There is no guarding or rebound.  Musculoskeletal:        General: No swelling or tenderness. Normal range of motion.     Cervical back: Normal range of motion and neck supple. No rigidity or tenderness.     Right lower leg: No edema.     Left  lower leg: No edema.     Comments: No cervical, thoracic, or lumbar spine midline tenderness.  No step-offs or crepitus noted.  Skin:    General: Skin is warm and dry.     Capillary Refill: Capillary refill takes less than 2 seconds.     Coloration: Skin is not pale.  Neurological:     Mental Status: She is alert. Mental status is at baseline.     Comments: Patient will follow some commands.  PERRLA.  Is moving all 4 extremities spontaneously.  Will answer some questions and or other questions.    Psychiatric:        Mood and Affect: Mood normal.        Behavior: Behavior normal.     ED Results / Procedures / Treatments   Labs (all labs ordered are listed, but only abnormal results are displayed) Labs Reviewed  COMPREHENSIVE METABOLIC PANEL - Abnormal; Notable for the following components:      Result Value   Sodium 147 (*)    Glucose, Bld 107 (*)    BUN 62 (*)    Creatinine, Ser 1.84 (*)    Albumin 3.2 (*)    GFR calc non Af Amer 26 (*)    GFR calc Af Amer 30 (*)    All other components within normal limits  CBC WITH DIFFERENTIAL/PLATELET - Abnormal; Notable for the following components:   Platelets 404 (*)    Monocytes Absolute 1.1 (*)    Abs Immature Granulocytes 0.08 (*)    All other components within normal limits  URINALYSIS, COMPLETE (UACMP) WITH MICROSCOPIC  CBG MONITORING, ED    EKG None  Radiology DG Chest Portable 1 View  Result Date: 05/19/2020 CLINICAL DATA:  Low O2 sats. EXAM: PORTABLE CHEST 1 VIEW COMPARISON:  None. FINDINGS: There appear to be nondisplaced fractures involving the anterolateral sixth and  seventh ribs on the left. There is a questionable small primarily basilar left-sided pneumothorax. The heart size is enlarged. Aortic calcifications are noted. There may be a small left-sided pleural effusion. The right-sided lung field appears clear. There are end-stage degenerative changes of the left glenohumeral joint. IMPRESSION: 1. Probable nondisplaced left-sided rib fractures with a questionable basilar left-sided pneumothorax. Evaluation is somewhat limited by supine technique. Consider a short interval follow-up chest x-ray or CT for further evaluation. 2. Cardiomegaly. These results were called by telephone at the time of interpretation on 05/19/2020 at 3:13 pm to provider [facility] Meryem Haertel , who verbally acknowledged these results. Electronically Signed   By: Katherine Mantle M.D.   On: 05/19/2020 15:13    Procedures Procedures (including critical care time)  Medications Ordered in ED Medications  LORazepam (ATIVAN) injection 0.5 mg (has no administration in time range)    ED Course  I have reviewed the triage vital signs and the nursing notes.  Pertinent labs & imaging results that were available during my care of the patient were reviewed by me and considered in my medical decision making (see chart for details).    MDM Rules/Calculators/A&P                         Kristin Grimes is a 79 y.o. female that presents to the emergency department today via EMS for being found down on the floor.  Patient lives at nursing home, was able to speak to them who states that they think she slid down from her chair and was on the floor for 5 minutes.  No LOC.  They are unsure if she hit her head.  Patient has advanced dementia, is cooperative to some commands, nursing home who states that patient is at baseline. Nursing told me that when she arrived she was satting 80% on room air.  Is now on 2 L of oxygen and sating at 98%.  Chest x-ray with potential pneumothorax with left-sided pressures.,  radiologist spoke to me and states that patient needs to be sitting upright for another scanner to get a CT.  Spoke to Dr. Rubin Payor who recommended trauma scans.  Pt care was handed off to L. Murphy PA-C at 330.  Complete history and physical and current plan have been communicated.  Please refer to their note for the remainder of ED care and ultimate disposition.  Awaiting all CT scans, CMP, urinalysis.  Dispo depending on CT scans however most likely admission due to most likely pneumothorax.   Final Clinical Impression(s) / ED Diagnoses Final diagnoses:  Fall, initial encounter    Rx / DC Orders ED Discharge Orders    None       Farrel Gordon, PA-C 05/19/20 1544    Benjiman Core, MD 05/20/20 0700

## 2020-05-19 NOTE — Progress Notes (Signed)
05/19/2020  1415  Labs drawn. Gold, purple, light green, and dark green tubes sent to main lab.

## 2020-05-19 NOTE — ED Provider Notes (Addendum)
70 yea old female from nursing home. Had an unwitnessed fall from wheelchair. Hx of advanced dementia, esophageal surgery, refusal to take PO. CXR obained by previous team shows possible pneumothorax and rib fractures. She was also hypoxic on arrival requiring 2L via [place]. At shift change CT head, C-spine, chest/abdomen/pelvis are pending.  Labs show AKI - scans were changed to non-contrast. Pt is quite agitated and required some Ativan.  CT scans are overall reassuring. There is no definite pneumo or rib fractures. Pt was reassessed and is now off O2. She is pending IVF administration. BP is also noted to be soft but she is on her side and unclear how accurate this is. Due to AKI, transient hypoxia, and soft BP will admit for observation to ensure she is stable before sending her back to her SNF. Case discussed with Dr. Rush Landmark. Discussed with Dr. Dayna Barker who will admit.    Bethel Born, PA-C 05/19/20 1753    Bethel Born, PA-C 05/19/20 1800    Tegeler, Canary Brim, MD 05/19/20 2312

## 2020-05-20 ENCOUNTER — Observation Stay (HOSPITAL_COMMUNITY): Payer: Medicare PPO

## 2020-05-20 ENCOUNTER — Encounter (HOSPITAL_COMMUNITY): Payer: Self-pay | Admitting: Internal Medicine

## 2020-05-20 ENCOUNTER — Other Ambulatory Visit: Payer: Self-pay

## 2020-05-20 DIAGNOSIS — Z515 Encounter for palliative care: Secondary | ICD-10-CM | POA: Diagnosis present

## 2020-05-20 DIAGNOSIS — E039 Hypothyroidism, unspecified: Secondary | ICD-10-CM | POA: Diagnosis present

## 2020-05-20 DIAGNOSIS — E785 Hyperlipidemia, unspecified: Secondary | ICD-10-CM | POA: Diagnosis present

## 2020-05-20 DIAGNOSIS — N179 Acute kidney failure, unspecified: Secondary | ICD-10-CM | POA: Diagnosis present

## 2020-05-20 DIAGNOSIS — Y92128 Other place in nursing home as the place of occurrence of the external cause: Secondary | ICD-10-CM | POA: Diagnosis not present

## 2020-05-20 DIAGNOSIS — Z79899 Other long term (current) drug therapy: Secondary | ICD-10-CM | POA: Diagnosis not present

## 2020-05-20 DIAGNOSIS — W050XXA Fall from non-moving wheelchair, initial encounter: Secondary | ICD-10-CM | POA: Diagnosis present

## 2020-05-20 DIAGNOSIS — Z87891 Personal history of nicotine dependence: Secondary | ICD-10-CM | POA: Diagnosis not present

## 2020-05-20 DIAGNOSIS — Z20822 Contact with and (suspected) exposure to covid-19: Secondary | ICD-10-CM | POA: Diagnosis present

## 2020-05-20 DIAGNOSIS — R531 Weakness: Secondary | ICD-10-CM

## 2020-05-20 DIAGNOSIS — K219 Gastro-esophageal reflux disease without esophagitis: Secondary | ICD-10-CM | POA: Diagnosis present

## 2020-05-20 DIAGNOSIS — F419 Anxiety disorder, unspecified: Secondary | ICD-10-CM | POA: Diagnosis not present

## 2020-05-20 DIAGNOSIS — I959 Hypotension, unspecified: Secondary | ICD-10-CM | POA: Diagnosis present

## 2020-05-20 DIAGNOSIS — I1310 Hypertensive heart and chronic kidney disease without heart failure, with stage 1 through stage 4 chronic kidney disease, or unspecified chronic kidney disease: Secondary | ICD-10-CM | POA: Diagnosis present

## 2020-05-20 DIAGNOSIS — N1831 Chronic kidney disease, stage 3a: Secondary | ICD-10-CM | POA: Diagnosis present

## 2020-05-20 DIAGNOSIS — Z79890 Hormone replacement therapy (postmenopausal): Secondary | ICD-10-CM | POA: Diagnosis not present

## 2020-05-20 DIAGNOSIS — F4310 Post-traumatic stress disorder, unspecified: Secondary | ICD-10-CM | POA: Diagnosis present

## 2020-05-20 DIAGNOSIS — E872 Acidosis: Secondary | ICD-10-CM | POA: Diagnosis present

## 2020-05-20 DIAGNOSIS — Z7189 Other specified counseling: Secondary | ICD-10-CM | POA: Diagnosis not present

## 2020-05-20 DIAGNOSIS — F0391 Unspecified dementia with behavioral disturbance: Secondary | ICD-10-CM | POA: Diagnosis present

## 2020-05-20 DIAGNOSIS — F0390 Unspecified dementia without behavioral disturbance: Secondary | ICD-10-CM | POA: Diagnosis not present

## 2020-05-20 DIAGNOSIS — B9620 Unspecified Escherichia coli [E. coli] as the cause of diseases classified elsewhere: Secondary | ICD-10-CM | POA: Diagnosis present

## 2020-05-20 DIAGNOSIS — Z66 Do not resuscitate: Secondary | ICD-10-CM | POA: Diagnosis present

## 2020-05-20 LAB — COMPREHENSIVE METABOLIC PANEL
ALT: 27 U/L (ref 0–44)
AST: 32 U/L (ref 15–41)
Albumin: 3.2 g/dL — ABNORMAL LOW (ref 3.5–5.0)
Alkaline Phosphatase: 67 U/L (ref 38–126)
Anion gap: 13 (ref 5–15)
BUN: 58 mg/dL — ABNORMAL HIGH (ref 8–23)
CO2: 20 mmol/L — ABNORMAL LOW (ref 22–32)
Calcium: 9.4 mg/dL (ref 8.9–10.3)
Chloride: 115 mmol/L — ABNORMAL HIGH (ref 98–111)
Creatinine, Ser: 1.61 mg/dL — ABNORMAL HIGH (ref 0.44–1.00)
GFR calc Af Amer: 35 mL/min — ABNORMAL LOW (ref >60)
GFR calc non Af Amer: 31 mL/min — ABNORMAL LOW (ref >60)
Glucose, Bld: 91 mg/dL (ref 70–99)
Potassium: 4.8 mmol/L (ref 3.5–5.1)
Sodium: 148 mmol/L — ABNORMAL HIGH (ref 135–145)
Total Bilirubin: 0.7 mg/dL (ref 0.3–1.2)
Total Protein: 6.6 g/dL (ref 6.5–8.1)

## 2020-05-20 LAB — CBC
HCT: 40.7 % (ref 36.0–46.0)
Hemoglobin: 12.7 g/dL (ref 12.0–15.0)
MCH: 31.4 pg (ref 26.0–34.0)
MCHC: 31.2 g/dL (ref 30.0–36.0)
MCV: 100.7 fL — ABNORMAL HIGH (ref 80.0–100.0)
Platelets: 396 10*3/uL (ref 150–400)
RBC: 4.04 MIL/uL (ref 3.87–5.11)
RDW: 15 % (ref 11.5–15.5)
WBC: 10.4 10*3/uL (ref 4.0–10.5)
nRBC: 0 % (ref 0.0–0.2)

## 2020-05-20 LAB — MRSA PCR SCREENING: MRSA by PCR: POSITIVE — AB

## 2020-05-20 MED ORDER — MUPIROCIN 2 % EX OINT
1.0000 "application " | TOPICAL_OINTMENT | Freq: Two times a day (BID) | CUTANEOUS | Status: DC
  Administered 2020-05-22 – 2020-05-23 (×3): 1 via NASAL
  Filled 2020-05-20 (×5): qty 22

## 2020-05-20 MED ORDER — ADULT MULTIVITAMIN W/MINERALS CH
1.0000 | ORAL_TABLET | Freq: Every day | ORAL | Status: DC
  Administered 2020-05-20 – 2020-05-24 (×5): 1 via ORAL
  Filled 2020-05-20 (×5): qty 1

## 2020-05-20 MED ORDER — SODIUM CHLORIDE 0.9 % IV BOLUS
500.0000 mL | Freq: Once | INTRAVENOUS | Status: AC
  Administered 2020-05-20: 500 mL via INTRAVENOUS

## 2020-05-20 MED ORDER — CHLORHEXIDINE GLUCONATE CLOTH 2 % EX PADS
6.0000 | MEDICATED_PAD | Freq: Every day | CUTANEOUS | Status: DC
  Administered 2020-05-21 – 2020-05-23 (×2): 6 via TOPICAL

## 2020-05-20 MED ORDER — ENSURE ENLIVE PO LIQD
237.0000 mL | Freq: Two times a day (BID) | ORAL | Status: DC
  Administered 2020-05-20 – 2020-05-24 (×4): 237 mL via ORAL

## 2020-05-20 MED ORDER — SODIUM CHLORIDE 0.9 % IV BOLUS
1000.0000 mL | Freq: Once | INTRAVENOUS | Status: AC
  Administered 2020-05-20: 1000 mL via INTRAVENOUS

## 2020-05-20 NOTE — Progress Notes (Signed)
Initial Nutrition Assessment  DOCUMENTATION CODES:   Obesity unspecified  INTERVENTION:   -Ensure Enlive po BID, each supplement provides 350 kcal and 20 grams of protein -Multivitamin with minerals daily  NUTRITION DIAGNOSIS:   Inadequate oral intake related to  (advanced dementia) as evidenced by meal completion < 25%.  GOAL:   Patient will meet greater than or equal to 90% of their needs  MONITOR:   PO intake, Supplement acceptance, Labs, Weight trends, I & O's  REASON FOR ASSESSMENT:   Malnutrition Screening Tool    ASSESSMENT:   79 y.o. female with medical history significant for advanced dementia on Ativan, Seroquel, Celexa, recent admission and discharge on June/28 for dehydration/altered mental status/possible syncopal event, CKD stage IIIa Baseline creatinine around 1, hypertension on nifedipine, lisinopril, Coreg, hypothyroidism, hyperlipidemia found on the floor beside the WC at the nursing home and sent to the ED.  Patient from memory care unit after fall from wheelchair. Pt currently disoriented x 4, with advanced dementia. Pt had a recent admission in June where she ate poorly d/t refusal of PO intakes.   Palliative care to see for GOC this admission.  Pt did consume 25% of breakfast this morning (~100 kcals and 2g protein). Ensure supplements have been ordered for additional kcals and protein.  No weight records beyond June 2021.   Medications: Ferrous sulfate, Miralax, Zofran   Labs reviewed: Elevated Na  NUTRITION - FOCUSED PHYSICAL EXAM:  Unable to complete  Diet Order:   Diet Order            DIET - DYS 1 Room service appropriate? Yes; Fluid consistency: Thin  Diet effective now                 EDUCATION NEEDS:   Not appropriate for education at this time  Skin:  Skin Assessment: Reviewed RN Assessment  Last BM:  PTA  Height:   Ht Readings from Last 1 Encounters:  05/20/20 5\' 5"  (1.651 m)    Weight:   Wt Readings from Last  1 Encounters:  05/20/20 83.3 kg    BMI:  Body mass index is 30.56 kg/m.  Estimated Nutritional Needs:   Kcal:  1550-1750  Protein:  65-75g  Fluid:  1.7L/day   07/21/20, MS, RD, LDN Inpatient Clinical Dietitian Contact information available via Amion
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
## 2020-05-20 NOTE — ED Notes (Signed)
Delay in transport to the floor due to RN being with another pt and unable to call report at the designated time.

## 2020-05-20 NOTE — Care Management Obs Status (Signed)
MEDICARE OBSERVATION STATUS NOTIFICATION   Patient Details  Name: Kristin Grimes MRN: [id number] Date of Birth: [birth year]   Medicare Observation Status Notification Given:  Yes    Elliot Gault, LCSW 05/20/2020, 1:39 PM

## 2020-05-20 NOTE — Progress Notes (Signed)
Civil engineer, contracting [facility]) [facility] Liaison Note:  This patient has been referred to [facility] Service and has been hospitalized before first consultation.    An [facility] Liaison will follow this patient during this hospitalization so please call with any Palliative related concerns if needed.  Thank you,  Roda Shutters, RN [facility] HLT [phone]

## 2020-05-20 NOTE — Progress Notes (Signed)
   05/20/20 1220  Vitals  Temp 97.7 F (36.5 C)  Temp Source Axillary  BP (!) 92/48  Pulse Rate 70  Resp 20  Level of Consciousness  Level of Consciousness Responds to Voice  Oxygen Therapy  SpO2 97 %  O2 Device Room Air  MEWS Score  MEWS Temp 0  MEWS Systolic 1  MEWS Pulse 0  MEWS RR 0  MEWS LOC 1  MEWS Score 2  MEWS Score Color Yellow  Dr. Jonathon Bellows paged with updated VS.

## 2020-05-20 NOTE — Progress Notes (Addendum)
Patient is confused. Continues to raise legs, swings out arms, keep eyes closed and mumble words that are mostly incomprehensible. Patient tosses and turns from left to right in bed. Vital signs are WNL except O2  Is 89 % on room air which is decreased from previous 95-98 %. . Started 2L of o2 . Will make oncall aware and will cont to monitor.

## 2020-05-20 NOTE — Progress Notes (Signed)
Reassess patient . Currently resting calmly. O2 saturation is 100% on 2L via [place]. Will cont to monitor.

## 2020-05-20 NOTE — Progress Notes (Signed)
MD made aware of Chest Xray results.

## 2020-05-20 NOTE — Progress Notes (Signed)
PROGRESS NOTE    Kristin Grimes  OEV:[id number] DOB: [birth year] DOA: 05/19/2020 PCP: Patient, No Pcp Per   Chef Complaints: ?fall  Brief Narrative: 79 y.o. female with medical history significant for advanced dementia on Ativan, Seroquel, Celexa, recent admission and discharge on June/28 for dehydration/altered mental status/possible syncopal event, CKD stage IIIa Baseline creatinine around 1, hypertension on nifedipine, lisinopril, Coreg, hypothyroidism, hyperlipidemia found on the floor beside the WC at the nursing home and sent to the ED.On previous admission patient was seen by palliative care for, prognosis was felt to be guarded if she had ongoing altered mental status.Patient and husband had recently moved from [place] and she had some baseline dementia there has been going downhill since.  ED Course:Initially 80% on RA and was placed on nasal cannula oxygen. Chest x-ray showed probable nondisplaced left-sided rib fractures with a questionable basilar left-sided pneumothorax evaluation limited by supine technique and CT was recommended.ED after discussion with radiology performed CT head CT chest CT abdomen pelvis which is limited by motion artifact lack of IV contrast given AKI but no left-sided pneumothorax no definite displaced left-sided rib fracture but evaluation was limited. [place] thoracic aortic aneurysm.It showed AKI on CKD given poor po intake admission was requested. On my exam patient is on room air, eyes closed briefly opens mumble words, when asked her name "I do not remember", does not follow and instruction.No family at the bedside. DNR form reviewed at [employer]. COVID screening pending. I did discuss with patient's daughter over the phone.She says she has been somewhat aggressive,does not want to be touched sometime. Apparently patient has been on the wheelchair next to the nursing station last time when family visited. She is at private memory care unit.     Subjective:  This morning patient was able to feed with nursing staff, she was thrashing her legs somewhat agitated.  Later on after she took her morning meds blood pressure was low in 60s, patient is still responding. Blood pressure improved after IV boluses. Remains confused does not follow command  Assessment & Plan:  ?Fall/fall next to her wheelchair at the nursing home: Trauma scan although limited by motion artifact no iv contrast 2/2 aki- has no acute findings although initial concern for pneumothorax in CXR as she was apparently hypoxic repeat chest x-ray 7/8 night no acute finding, except this morning currently low pneumomediastinum.  Patient able to wean off oxygen again this morning, not in acute distress.  Will follow up with chest x-ray versus CT scan.  Continue on supportive care.   AKI on CKD stage IIIa Baseline creatinine around 1: With some decreased intake per report.  Not clear how much she was eating at the facility.  Did eat with the nursing staff this morning.  Also with hypotension elevated BUN, received IV fluid bolus this morning and will keep on IV fluid hydration.  Follow-up labs in the morning creatinine at 1.8 and decreasing. Recent Labs  Lab 05/19/20 1405 05/20/20 0133  BUN 62* 58*  CREATININE 1.84* 1.61*   Metabolic acidosis-Cont ivf  Advance dementia/recent admission, patient is confused, eyes are closed response intermittently moving all her extremities.She is on Ativan, Seroquel, Celexa will be continued, will adjust medication based upon the response.  Again will be a challenging due to her dementia and on last admission meds were adjusted over 3 to 4 weeks. Keep on fall precaution, aspiration precaution.  It seems she is on dysphagia 1 diet, continue with assistance.  Palliative care consulted. ?psych  eval.  Essential hypertension: Hold blood pressure medication due to hypotension.  Hypotension likely multifactorial from volume depletion,  antihypertensive regimen.  Not clear if patient was taking her antihypertensive at ALF. Nursing were able to give her antihypertensive meds- coreg, procardia this morning and blood pressure started to go down after that.  We will hold blood pressure medication. S/p 1.5 L bolus  NSS and blood pressure has improved.  Monitor.  Hypothyroidism continue home Synthroid.    Hyperlipidemia on statin.    DVT prophylaxis: enoxaparin (LOVENOX) injection 30 mg Start: 05/19/20 2200 Code Status: DNR.  Consult palliative care. Family Communication: plan of care discussed with patient at bedside.  Status is: Inpatient.  Due to ongoing need for IV fluid hydration medication adjustment in the setting of advanced dementia with behavioral disturbance patient remains hospitalized and will need at least 2 midnight stay.  Remains inpatient appropriate because:Hemodynamically unstable, Unsafe d/c plan and Inpatient level of care appropriate due to severity of illness   Dispo: The patient is from: ALF              Anticipated d/c is to: ALF              Anticipated d/c date is: 2 days              Patient currently is not medically stable to d/c.  Nutrition: Diet Order            DIET - DYS 1 Room service appropriate? Yes; Fluid consistency: Thin  Diet effective now                 Body mass index is 30.56 kg/m.  Consultants:see note  Procedures:see note Microbiology:see note  Medications: Scheduled Meds: . atorvastatin  40 mg Oral QHS  . [START ON 05/21/2020] Chlorhexidine Gluconate Cloth  6 each Topical Q0600  . enoxaparin (LOVENOX) injection  30 mg Subcutaneous Q24H  . escitalopram  20 mg Oral Daily  . feeding supplement (ENSURE ENLIVE)  237 mL Oral BID BM  . ferrous sulfate  324 mg Oral Q breakfast  . levothyroxine  112 mcg Oral Q0600  . mupirocin ointment  1 application Nasal BID  . pantoprazole  40 mg Oral Daily  . polyethylene glycol  17 g Oral Daily  . QUEtiapine  25 mg Oral Daily    And  . QUEtiapine  100 mg Oral QHS   Continuous Infusions: . sodium chloride 125 mL/hr at 05/20/20 1340    Antimicrobials: Anti-infectives (From admission, onward)   None     Objective: Vitals: Today's Vitals   05/20/20 1120 05/20/20 1138 05/20/20 1220 05/20/20 1312  BP: (!) 62/35 (!) 83/57 (!) 92/48 124/83  Pulse: 67 63 70 67  Resp: Temp: 97.8 F (36.6 C) 97.7 F (36.5 C) 97.7 F (36.5 C) (!) 97.5 F (36.4 C)  TempSrc: Axillary Axillary Axillary Oral  SpO2: 91% 95% 97% 91%  Weight:      Height:      PainSc:        Intake/Output Summary (Last 24 hours) at 05/20/2020 1354 Last data filed at 05/20/2020 1032 Gross per 24 hour  Intake 1377.89 ml  Output --  Net 1377.89 ml   Filed Weights   05/20/20 1048  Weight: 83.3 kg   Weight change:    Intake/Output from previous day: 07/08 0701 - 07/09 0700 In: 714.9 [I.V.:714.9] Out: -  Intake/Output this shift: Total I/O In: 663 [P.O.:120; I.V.:543] Out: -  Examination:  General exam: Eyes closed response to pain, on RA HEENT:Oral mucosa moist, Ear/Nose WNL grossly,dentition normal. Respiratory system: bilaterally clear,no wheezing or crackles,no use of accessory muscle, non tender. Cardiovascular system: S1 & S2 +, regular, No JVD. Gastrointestinal system: Abdomen soft, NT,ND, BS+. Nervous System:Alert, awake, moving extremities and grossly nonfocal Extremities: No edema, distal peripheral pulses palpable.  Skin: No rashes,no icterus. MSK: Normal muscle bulk,tone, power.  Data Reviewed: I have personally reviewed following labs and imaging studies CBC: Recent Labs  Lab 05/19/20 1405 05/20/20 0133  WBC 7.6 10.4  NEUTROABS 4.9  --   HGB 13.5 12.7  HCT 42.7 40.7  MCV 99.5 100.7*  PLT 404* 396   Basic Metabolic Panel: Recent Labs  Lab 05/19/20 1405 05/20/20 0133  NA 147* 148*  K 4.8 4.8  CL 109 115*  CO2 28 20*  GLUCOSE 107* 91  BUN 62* 58*  CREATININE 1.84* 1.61*  CALCIUM 9.8 9.4    GFR: Estimated Creatinine Clearance: 31.2 mL/min (A) (by C-G formula based on SCr of 1.61 mg/dL (H)). Liver Function Tests: Recent Labs  Lab 05/19/20 1405 05/20/20 0133  AST 39 32  ALT 25 27  ALKPHOS 68 67  BILITOT 0.9 0.7  PROT 7.0 6.6  ALBUMIN 3.2* 3.2*   No results for input(s): LIPASE, AMYLASE in the last 168 hours. No results for input(s): AMMONIA in the last 168 hours. Coagulation Profile: No results for input(s): INR, PROTIME in the last 168 hours. Cardiac Enzymes: No results for input(s): CKTOTAL, CKMB, CKMBINDEX, TROPONINI in the last 168 hours. BNP (last 3 results) No results for input(s): PROBNP in the last 8760 hours. HbA1C: No results for input(s): HGBA1C in the last 72 hours. CBG: Recent Labs  Lab 05/19/20 1400  GLUCAP 98   Lipid Profile: No results for input(s): CHOL, HDL, LDLCALC, TRIG, CHOLHDL, LDLDIRECT in the last 72 hours. Thyroid Function Tests: No results for input(s): TSH, T4TOTAL, FREET4, T3FREE, THYROIDAB in the last 72 hours. Anemia Panel: No results for input(s): VITAMINB12, FOLATE, FERRITIN, TIBC, IRON, RETICCTPCT in the last 72 hours. Sepsis Labs: No results for input(s): PROCALCITON, LATICACIDVEN in the last 168 hours.  Recent Results (from the past 240 hour(s))  SARS Coronavirus 2 by RT PCR (hospital order, performed in [facility] hospital lab) Nasopharyngeal Nasopharyngeal Swab     Status: None   Collection Time: 05/19/20  5:44 PM   Specimen: Nasopharyngeal Swab  Result Value Ref Range Status   SARS Coronavirus 2 NEGATIVE NEGATIVE Final    Comment: (NOTE) SARS-CoV-2 target nucleic acids are NOT DETECTED.  The SARS-CoV-2 RNA is generally detectable in upper and lower respiratory specimens during the acute phase of infection. The lowest concentration of SARS-CoV-2 viral copies this assay can detect is 250 copies / mL. A negative result does not preclude SARS-CoV-2 infection and should not be used as the sole basis for treatment or  other patient management decisions.  A negative result may occur with improper specimen collection / handling, submission of specimen other than nasopharyngeal swab, presence of viral mutation(s) within the areas targeted by this assay, and inadequate number of viral copies (<250 copies / mL). A negative result must be combined with clinical observations, patient history, and epidemiological information.  Fact Sheet for Patients:   BoilerBrush.com.cy  Fact Sheet for Healthcare Providers: https://pope.com/  This test is not yet approved or  cleared by the [place] FDA and has been authorized for detection and/or diagnosis of SARS-CoV-2 by FDA under an Emergency  Use Authorization (EUA).  This EUA will remain in effect (meaning this test can be used) for the duration of the COVID-19 declaration under Section 564(b)(1) of the Act, 21 U.S.C. section 360bbb-3(b)(1), unless the authorization is terminated or revoked sooner.  Performed at [facility], [address] [place], [place] [address]   MRSA PCR Screening     Status: Abnormal   Collection Time: 05/20/20 11:27 AM   Specimen: Nasal Mucosa; Nasopharyngeal  Result Value Ref Range Status   MRSA by PCR POSITIVE (A) NEGATIVE Final    Comment:        The GeneXpert MRSA Assay (FDA approved for NASAL specimens only), is one component of a comprehensive MRSA colonization surveillance program. It is not intended to diagnose MRSA infection nor to guide or monitor treatment for MRSA infections. RESULT CALLED TO, READ BACK BY AND VERIFIED WITH: MAYS,J. RN  05/20/20 BILLINGSLEY,L Performed at [facility], [address], [place], [place] [address]       Radiology Studies: CT ABDOMEN PELVIS WO CONTRAST  Result Date: 05/19/2020 CLINICAL DATA:  Chest trauma. EXAM: CT CHEST, ABDOMEN AND PELVIS WITHOUT CONTRAST TECHNIQUE: Multidetector CT  imaging of the chest, abdomen and pelvis was performed following the standard protocol without IV contrast. COMPARISON:  None. FINDINGS: CT CHEST FINDINGS Cardiovascular: The heart size is normal. The ascending aorta appears to be borderline aneurysmal measuring up to approximately 4 cm. There are atherosclerotic changes of the thoracic aorta and coronary arteries. There is no significant pericardial effusion. Mediastinum/Nodes: -- No mediastinal lymphadenopathy. -- No hilar lymphadenopathy. -- No axillary lymphadenopathy. -- No supraclavicular lymphadenopathy. -- Normal thyroid gland where visualized. -there is a large hiatal hernia. Lungs/Pleura: Examination of the lung fields is limited by significant respiratory motion artifact. There is no pneumothorax. No large pleural effusion. There is atelectasis at the lung bases. Musculoskeletal: Evaluation of the osseous structures is severely limited by extensive motion artifact. There is an age-indeterminate compression fracture of the T4 vertebral body. CT ABDOMEN PELVIS FINDINGS Hepatobiliary: Evaluation of the liver is limited by lack of IV contrast. There are indeterminate hypoattenuating lesions in the liver measuring up to approximately 3.4 cm. The gallbladder is mildly distended.There is no biliary ductal dilation. Pancreas: Normal contours without ductal dilatation. No peripancreatic fluid collection. Spleen: Unremarkable. Adrenals/Urinary Tract: --Adrenal glands: Unremarkable. --Right kidney/ureter: No hydronephrosis or radiopaque kidney stones. --Left kidney/ureter: No hydronephrosis or radiopaque kidney stones. --Urinary bladder: Unremarkable. Stomach/Bowel: --Stomach/Duodenum: There is a large hiatal hernia. --Small bowel: Unremarkable. --Colon: There is scattered colonic diverticula without CT evidence for diverticulitis. --Appendix: Not visualized. No right lower quadrant inflammation or free fluid. Vascular/Lymphatic: Atherosclerotic calcification is  present within the non-aneurysmal abdominal aorta, without hemodynamically significant stenosis. --No retroperitoneal lymphadenopathy. --No mesenteric lymphadenopathy. --No pelvic or inguinal lymphadenopathy. Reproductive: There is a fat containing left ovarian lesion measuring approximately 4.3 cm (axial series 3, image 73). Other: No ascites or free air. The abdominal wall is normal. Musculoskeletal. There are degenerative changes throughout the thoracolumbar spine. There is a degenerative dextroscoliosis of the lumbar spine. IMPRESSION: 1. Examination is severely limited by extensive motion artifact and lack of IV contrast. 2. No definite acute abnormality given the above limitations. Specifically, there is no left-sided pneumothorax as was identified on the patient's recent chest x-ray. Additionally, there is no definite displaced left-sided rib fracture, however evaluation is severely limited by motion artifact. 3. There are multiple indeterminate hypoattenuating masses in the liver. Further evaluation with a contrast enhanced CT or  MRI is recommended when the patient's condition improves in the patient is able to follow commands and breathing instructions. 4. 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 Recommend annual imaging followup by CTA or MRA. This recommendation follows 2010 ACCF/AHA/AATS/ACR/ASA/SCA/SCAI/SIR/STS/SVM Guidelines for the Diagnosis and Management of Patients with Thoracic Aortic Disease. Circulation. 2010; 121: F188-Q773. Aortic aneurysm NOS (ICD10-I71.9) 5. Large hiatal hernia. 6. Diverticulosis without CT evidence for diverticulitis. 7. 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 8.  Aortic Atherosclerosis (ICD10-I70.0). Aortic Atherosclerosis (ICD10-I70.0). Electronically Signed   By: Katherine Mantle M.D.   On: 05/19/2020 17:16   CT HEAD WO CONTRAST  Result Date: 05/19/2020 CLINICAL DATA:  Head trauma found on  floor EXAM: CT HEAD WITHOUT CONTRAST TECHNIQUE: Contiguous axial images were obtained from the base of the skull through the vertex without intravenous contrast. COMPARISON:  Brain MRI 04/24/2020, CT brain 04/15/2020 FINDINGS: Brain: Motion degradation at the cranial vertex. No acute territorial infarction, hemorrhage or intracranial mass. Atrophy. Mild chronic small vessel ischemic change of the white matter. Stable ventricle size. Vascular: No hyperdense vessels. Scattered carotid vascular calcification. Skull: Normal. Negative for fracture or focal lesion. Sinuses/Orbits: Opacified right sphenoid sinus. Mucosal thickening in the ethmoid sinuses. Other: None IMPRESSION: 1. Limited evaluation at cranial vertex secondary to motion. 2. No definite CT evidence for acute intracranial abnormality. Atrophy and chronic small vessel ischemic change of the white matter 3. Interval sinus disease Electronically Signed   By: Jasmine Pang M.D.   On: 05/19/2020 17:11   CT Chest Wo Contrast  Result Date: 05/19/2020 CLINICAL DATA:  Chest trauma. EXAM: CT CHEST, ABDOMEN AND PELVIS WITHOUT CONTRAST TECHNIQUE: Multidetector CT imaging of the chest, abdomen and pelvis was performed following the standard protocol without IV contrast. COMPARISON:  None. FINDINGS: CT CHEST FINDINGS Cardiovascular: The heart size is normal. The ascending aorta appears to be borderline aneurysmal measuring up to approximately 4 cm. There are atherosclerotic changes of the thoracic aorta and coronary arteries. There is no significant pericardial effusion. Mediastinum/Nodes: -- No mediastinal lymphadenopathy. -- No hilar lymphadenopathy. -- No axillary lymphadenopathy. -- No supraclavicular lymphadenopathy. -- Normal thyroid gland where visualized. -there is a large hiatal hernia. Lungs/Pleura: Examination of the lung fields is limited by significant respiratory motion artifact. There is no pneumothorax. No large pleural effusion. There is atelectasis  at the lung bases. Musculoskeletal: Evaluation of the osseous structures is severely limited by extensive motion artifact. There is an age-indeterminate compression fracture of the T4 vertebral body. CT ABDOMEN PELVIS FINDINGS Hepatobiliary: Evaluation of the liver is limited by lack of IV contrast. There are indeterminate hypoattenuating lesions in the liver measuring up to approximately 3.4 cm. The gallbladder is mildly distended.There is no biliary ductal dilation. Pancreas: Normal contours without ductal dilatation. No peripancreatic fluid collection. Spleen: Unremarkable. Adrenals/Urinary Tract: --Adrenal glands: Unremarkable. --Right kidney/ureter: No hydronephrosis or radiopaque kidney stones. --Left kidney/ureter: No hydronephrosis or radiopaque kidney stones. --Urinary bladder: Unremarkable. Stomach/Bowel: --Stomach/Duodenum: There is a large hiatal hernia. --Small bowel: Unremarkable. --Colon: There is scattered colonic diverticula without CT evidence for diverticulitis. --Appendix: Not visualized. No right lower quadrant inflammation or free fluid. Vascular/Lymphatic: Atherosclerotic calcification is present within the non-aneurysmal abdominal aorta, without hemodynamically significant stenosis. --No retroperitoneal lymphadenopathy. --No mesenteric lymphadenopathy. --No pelvic or inguinal lymphadenopathy. Reproductive: There is a fat containing left ovarian lesion measuring approximately 4.3 cm (axial series 3, image 73). Other: No ascites or free air. The abdominal wall is normal. Musculoskeletal. There  are degenerative changes throughout the thoracolumbar spine. There is a degenerative dextroscoliosis of the lumbar spine. IMPRESSION: 1. Examination is severely limited by extensive motion artifact and lack of IV contrast. 2. No definite acute abnormality given the above limitations. Specifically, there is no left-sided pneumothorax as was identified on the patient's recent chest x-ray. Additionally,  there is no definite displaced left-sided rib fracture, however evaluation is severely limited by motion artifact. 3. There are multiple indeterminate hypoattenuating masses in the liver. Further evaluation with a contrast enhanced CT or MRI is recommended when the patient's condition improves in the patient is able to follow commands and breathing instructions. 4. 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 Recommend annual imaging followup by CTA or MRA. This recommendation follows 2010 ACCF/AHA/AATS/ACR/ASA/SCA/SCAI/SIR/STS/SVM Guidelines for the Diagnosis and Management of Patients with Thoracic Aortic Disease. Circulation. 2010; 121: Z610-R604. Aortic aneurysm NOS (ICD10-I71.9) 5. Large hiatal hernia. 6. Diverticulosis without CT evidence for diverticulitis. 7. 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 8.  Aortic Atherosclerosis (ICD10-I70.0). Aortic Atherosclerosis (ICD10-I70.0). Electronically Signed   By: Katherine Mantle M.D.   On: 05/19/2020 17:16   DG CHEST PORT 1 VIEW  Result Date: 05/20/2020 CLINICAL DATA:  Shortness of breath. EXAM: PORTABLE CHEST 1 VIEW COMPARISON:  05/19/2020, 04/15/2020.  CT chest 05/19/2020. FINDINGS: Patient is rotated to the right. Subtle pneumomediastinum cannot be excluded. Follow-up PA and lateral chest x-ray suggested. Heart size stable. Mild subsegmental atelectasis right lung base. No pleural effusion or pneumothorax. Sliding hiatal hernia. Thoracic spine scoliosis. IMPRESSION: 1. Subtle pneumomediastinum cannot be excluded. Follow-up PA lateral chest x-ray suggested. 2.  Mild subsegmental atelectasis right lung base. These results will be called to the ordering clinician or representative by the Radiologist Assistant, and communication documented in the PACS or [employer]. Electronically Signed   By: Maisie Fus  Register   On: 05/20/2020 08:10   Portable chest 1 View  Result Date:  05/19/2020 CLINICAL DATA:  Unwitnessed fall EXAM: PORTABLE CHEST 1 VIEW COMPARISON:  05/19/2020 FINDINGS: No focal opacity or pleural effusion. Low lung volumes. Stable cardiomediastinal silhouette. No pneumothorax is seen. IMPRESSION: No active disease. Low lung volumes. Electronically Signed   By: Jasmine Pang M.D.   On: 05/19/2020 23:43   DG Chest Portable 1 View  Result Date: 05/19/2020 CLINICAL DATA:  Low O2 sats. EXAM: PORTABLE CHEST 1 VIEW COMPARISON:  None. FINDINGS: There appear to be nondisplaced fractures involving the anterolateral sixth and seventh ribs on the left. There is a questionable small primarily basilar left-sided pneumothorax. The heart size is enlarged. Aortic calcifications are noted. There may be a small left-sided pleural effusion. The right-sided lung field appears clear. There are end-stage degenerative changes of the left glenohumeral joint. IMPRESSION: 1. Probable nondisplaced left-sided rib fractures with a questionable basilar left-sided pneumothorax. Evaluation is somewhat limited by supine technique. Consider a short interval follow-up chest x-ray or CT for further evaluation. 2. Cardiomegaly. These results were called by telephone at the time of interpretation on 05/19/2020 at 3:13 pm to provider [facility] PATEL , who verbally acknowledged these results. Electronically Signed   By: Katherine Mantle M.D.   On: 05/19/2020 15:13     LOS: 0 days   Lanae Boast, MD [facility]  05/20/2020, 1:54 PM

## 2020-05-20 NOTE — Progress Notes (Signed)
   05/20/20 1120  Assess: MEWS Score  Temp 97.8 F (36.6 C)  BP (!) 62/35  Pulse Rate 67  Resp 14  Level of Consciousness Responds to Pain  SpO2 91 %  O2 Device Room Air  Assess: MEWS Score  MEWS Temp 0  MEWS Systolic 3  MEWS Pulse 0  MEWS RR 0  MEWS LOC 2  MEWS Score 5  MEWS Score Color Red  Assess: if the MEWS score is Yellow or Red  Were vital signs taken at a resting state? Yes  Focused Assessment Documented focused assessment  Early Detection of Sepsis Score *See Row Information* Low  MEWS guidelines implemented *See Row Information* Yes  Treat  MEWS Interventions Administered scheduled meds/treatments  Take Vital Signs  Increase Vital Sign Frequency  Red: Q 1hr X 4 then Q 4hr X 4, if remains red, continue Q 4hrs  Escalate  MEWS: Escalate Red: discuss with charge nurse/RN and provider, consider discussing with RRT  Notify: Charge Nurse/RN  Name of Charge Nurse/RN Notified Launa Flight, RN  Date Charge Nurse/RN Notified 05/20/20  Time Charge Nurse/RN Notified 1122  Notify: Provider  Provider Name/Title Dr. Lanae Boast  Date Provider Notified 05/20/20  Time Provider Notified 1120  Notification Type Page  Notification Reason Change in status  Response See new orders  Date of Provider Response 05/20/20  Time of Provider Response 1123  Dr. Jonathon Bellows at bedside assessing patient.

## 2020-05-20 NOTE — ED Notes (Addendum)
ED TO INPATIENT HANDOFF REPORT  ED Nurse Name and Phone #: Sharene Skeans [phone]  S Name/Age/Gender Kristin Grimes 79 y.o. female Room/Bed: WA04/WA04  Code Status   Code Status: DNR  Home/SNF/Other Nursing Home Patient oriented to: self Is this baseline? Yes   Triage Complete: Triage complete  Chief Complaint AKI (acute kidney injury) [facility]) [N17.9]  Triage Note 05/19/2020  1257 EMS states patient was found on the floor beside the wheelchair. Pt was able to stand and pivot to stretcher with assistance. Per facility patient is at her baseline.    Allergies No Known Allergies  Level of Care/Admitting Diagnosis ED Disposition    ED Disposition Condition Comment   Admit  Hospital Area: [facility] [[id number]]  Level of Care: Med-Surg [16]  Covid Evaluation: Asymptomatic Screening Protocol (No Symptoms)  Diagnosis: AKI (acute kidney injury) [facility]) [[id number]]  Admitting Physician: Lanae Boast [[id number]]  Attending Physician: Lanae Boast [[id number]]       B Medical/Surgery History Past Medical History:  Diagnosis Date  . Hypothyroidism    No past surgical history on file.   A IV Location/Drains/Wounds Patient Lines/Drains/Airways Status    Active Line/Drains/Airways    Name Placement date Placement time Site Days   Peripheral IV 05/19/20 Right Arm 05/19/20  1753  Arm  1   External Urinary Catheter 04/20/20  1303  --  30          Intake/Output Last 24 hours No intake or output data in the 24 hours ending 05/20/20 0109  Labs/Imaging Results for orders placed or performed during the hospital encounter of 05/19/20 (from the past 48 hour(s))  CBG monitoring, ED     Status: None   Collection Time: 05/19/20  2:00 PM  Result Value Ref Range   Glucose-Capillary 98 70 - 99 mg/dL    Comment: Glucose reference range applies only to samples taken after fasting for at least 8 hours.  Comprehensive metabolic panel     Status: Abnormal   Collection Time:  05/19/20  2:05 PM  Result Value Ref Range   Sodium 147 (H) 135 - 145 mmol/L   Potassium 4.8 3.5 - 5.1 mmol/L   Chloride 109 98 - 111 mmol/L   CO2 28 22 - 32 mmol/L   Glucose, Bld 107 (H) 70 - 99 mg/dL    Comment: Glucose reference range applies only to samples taken after fasting for at least 8 hours.   BUN 62 (H) 8 - 23 mg/dL   Creatinine, Ser 6.29 (H) 0.44 - 1.00 mg/dL   Calcium 9.8 8.9 - 52.8 mg/dL   Total Protein 7.0 6.5 - 8.1 g/dL   Albumin 3.2 (L) 3.5 - 5.0 g/dL   AST 39 15 - 41 U/L   ALT 25 0 - 44 U/L   Alkaline Phosphatase 68 38 - 126 U/L   Total Bilirubin 0.9 0.3 - 1.2 mg/dL   GFR calc non Af Amer 26 (L) >60 mL/min   GFR calc Af Amer 30 (L) >60 mL/min   Anion gap 10 5 - 15    Comment: Performed at [facility], [address] [place], [place] [address]  CBC WITH DIFFERENTIAL     Status: Abnormal   Collection Time: 05/19/20  2:05 PM  Result Value Ref Range   WBC 7.6 4.0 - 10.5 K/uL   RBC 4.29 3.87 - 5.11 MIL/uL   Hemoglobin 13.5 12.0 - 15.0 g/dL   HCT 40.1 36 - 46 %   MCV  99.5 80.0 - 100.0 fL   MCH 31.5 26.0 - 34.0 pg   MCHC 31.6 30.0 - 36.0 g/dL   RDW 16.1 09.6 - 04.5 %   Platelets 404 (H) 150 - 400 K/uL   nRBC 0.0 0.0 - 0.2 %   Neutrophils Relative % 64 %   Neutro Abs 4.9 1.7 - 7.7 K/uL   Lymphocytes Relative 19 %   Lymphs Abs 1.4 0.7 - 4.0 K/uL   Monocytes Relative 14 %   Monocytes Absolute 1.1 (H) 0 - 1 K/uL   Eosinophils Relative 1 %   Eosinophils Absolute 0.1 0 - 0 K/uL   Basophils Relative 1 %   Basophils Absolute 0.0 0 - 0 K/uL   Immature Granulocytes 1 %   Abs Immature Granulocytes 0.08 (H) 0.00 - 0.07 K/uL    Comment: Performed at [facility], [address], [place], [place] [address]  SARS Coronavirus 2 by RT PCR (hospital order, performed in [facility] hospital lab) Nasopharyngeal Nasopharyngeal Swab     Status: None   Collection Time: 05/19/20  5:44 PM   Specimen: Nasopharyngeal Swab  Result Value Ref  Range   SARS Coronavirus 2 NEGATIVE NEGATIVE    Comment: (NOTE) SARS-CoV-2 target nucleic acids are NOT DETECTED.  The SARS-CoV-2 RNA is generally detectable in upper and lower respiratory specimens during the acute phase of infection. The lowest concentration of SARS-CoV-2 viral copies this assay can detect is 250 copies / mL. A negative result does not preclude SARS-CoV-2 infection and should not be used as the sole basis for treatment or other patient management decisions.  A negative result may occur with improper specimen collection / handling, submission of specimen other than nasopharyngeal swab, presence of viral mutation(s) within the areas targeted by this assay, and inadequate number of viral copies (<250 copies / mL). A negative result must be combined with clinical observations, patient history, and epidemiological information.  Fact Sheet for Patients:   BoilerBrush.com.cy  Fact Sheet for Healthcare Providers: https://pope.com/  This test is not yet approved or  cleared by the [place] FDA and has been authorized for detection and/or diagnosis of SARS-CoV-2 by FDA under an Emergency Use Authorization (EUA).  This EUA will remain in effect (meaning this test can be used) for the duration of the COVID-19 declaration under Section 564(b)(1) of the Act, 21 U.S.C. section 360bbb-3(b)(1), unless the authorization is terminated or revoked sooner.  Performed at [facility], [address] [place], [place] [address]    CT ABDOMEN PELVIS WO CONTRAST  Result Date: 05/19/2020 CLINICAL DATA:  Chest trauma. EXAM: CT CHEST, ABDOMEN AND PELVIS WITHOUT CONTRAST TECHNIQUE: Multidetector CT imaging of the chest, abdomen and pelvis was performed following the standard protocol without IV contrast. COMPARISON:  None. FINDINGS: CT CHEST FINDINGS Cardiovascular: The heart size is normal. The ascending aorta appears to  be borderline aneurysmal measuring up to approximately 4 cm. There are atherosclerotic changes of the thoracic aorta and coronary arteries. There is no significant pericardial effusion. Mediastinum/Nodes: -- No mediastinal lymphadenopathy. -- No hilar lymphadenopathy. -- No axillary lymphadenopathy. -- No supraclavicular lymphadenopathy. -- Normal thyroid gland where visualized. -there is a large hiatal hernia. Lungs/Pleura: Examination of the lung fields is limited by significant respiratory motion artifact. There is no pneumothorax. No large pleural effusion. There is atelectasis at the lung bases. Musculoskeletal: Evaluation of the osseous structures is severely limited by extensive motion artifact. There is an age-indeterminate compression fracture of the T4  vertebral body. CT ABDOMEN PELVIS FINDINGS Hepatobiliary: Evaluation of the liver is limited by lack of IV contrast. There are indeterminate hypoattenuating lesions in the liver measuring up to approximately 3.4 cm. The gallbladder is mildly distended.There is no biliary ductal dilation. Pancreas: Normal contours without ductal dilatation. No peripancreatic fluid collection. Spleen: Unremarkable. Adrenals/Urinary Tract: --Adrenal glands: Unremarkable. --Right kidney/ureter: No hydronephrosis or radiopaque kidney stones. --Left kidney/ureter: No hydronephrosis or radiopaque kidney stones. --Urinary bladder: Unremarkable. Stomach/Bowel: --Stomach/Duodenum: There is a large hiatal hernia. --Small bowel: Unremarkable. --Colon: There is scattered colonic diverticula without CT evidence for diverticulitis. --Appendix: Not visualized. No right lower quadrant inflammation or free fluid. Vascular/Lymphatic: Atherosclerotic calcification is present within the non-aneurysmal abdominal aorta, without hemodynamically significant stenosis. --No retroperitoneal lymphadenopathy. --No mesenteric lymphadenopathy. --No pelvic or inguinal lymphadenopathy. Reproductive: There is  a fat containing left ovarian lesion measuring approximately 4.3 cm (axial series 3, image 73). Other: No ascites or free air. The abdominal wall is normal. Musculoskeletal. There are degenerative changes throughout the thoracolumbar spine. There is a degenerative dextroscoliosis of the lumbar spine. IMPRESSION: 1. Examination is severely limited by extensive motion artifact and lack of IV contrast. 2. No definite acute abnormality given the above limitations. Specifically, there is no left-sided pneumothorax as was identified on the patient's recent chest x-ray. Additionally, there is no definite displaced left-sided rib fracture, however evaluation is severely limited by motion artifact. 3. There are multiple indeterminate hypoattenuating masses in the liver. Further evaluation with a contrast enhanced CT or MRI is recommended when the patient's condition improves in the patient is able to follow commands and breathing instructions. 4. 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 Recommend annual imaging followup by CTA or MRA. This recommendation follows 2010 ACCF/AHA/AATS/ACR/ASA/SCA/SCAI/SIR/STS/SVM Guidelines for the Diagnosis and Management of Patients with Thoracic Aortic Disease. Circulation. 2010; 121: Q676-P950. Aortic aneurysm NOS (ICD10-I71.9) 5. Large hiatal hernia. 6. Diverticulosis without CT evidence for diverticulitis. 7. 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 8.  Aortic Atherosclerosis (ICD10-I70.0). Aortic Atherosclerosis (ICD10-I70.0). Electronically Signed   By: Katherine Mantle M.D.   On: 05/19/2020 17:16   CT HEAD WO CONTRAST  Result Date: 05/19/2020 CLINICAL DATA:  Head trauma found on floor EXAM: CT HEAD WITHOUT CONTRAST TECHNIQUE: Contiguous axial images were obtained from the base of the skull through the vertex without intravenous contrast. COMPARISON:  Brain MRI 04/24/2020, CT brain 04/15/2020 FINDINGS: Brain:  Motion degradation at the cranial vertex. No acute territorial infarction, hemorrhage or intracranial mass. Atrophy. Mild chronic small vessel ischemic change of the white matter. Stable ventricle size. Vascular: No hyperdense vessels. Scattered carotid vascular calcification. Skull: Normal. Negative for fracture or focal lesion. Sinuses/Orbits: Opacified right sphenoid sinus. Mucosal thickening in the ethmoid sinuses. Other: None IMPRESSION: 1. Limited evaluation at cranial vertex secondary to motion. 2. No definite CT evidence for acute intracranial abnormality. Atrophy and chronic small vessel ischemic change of the white matter 3. Interval sinus disease Electronically Signed   By: Jasmine Pang M.D.   On: 05/19/2020 17:11   CT Chest Wo Contrast  Result Date: 05/19/2020 CLINICAL DATA:  Chest trauma. EXAM: CT CHEST, ABDOMEN AND PELVIS WITHOUT CONTRAST TECHNIQUE: Multidetector CT imaging of the chest, abdomen and pelvis was performed following the standard protocol without IV contrast. COMPARISON:  None. FINDINGS: CT CHEST FINDINGS Cardiovascular: The heart size is normal. The ascending aorta appears to be borderline aneurysmal measuring up to approximately 4 cm. There are atherosclerotic changes of the  thoracic aorta and coronary arteries. There is no significant pericardial effusion. Mediastinum/Nodes: -- No mediastinal lymphadenopathy. -- No hilar lymphadenopathy. -- No axillary lymphadenopathy. -- No supraclavicular lymphadenopathy. -- Normal thyroid gland where visualized. -there is a large hiatal hernia. Lungs/Pleura: Examination of the lung fields is limited by significant respiratory motion artifact. There is no pneumothorax. No large pleural effusion. There is atelectasis at the lung bases. Musculoskeletal: Evaluation of the osseous structures is severely limited by extensive motion artifact. There is an age-indeterminate compression fracture of the T4 vertebral body. CT ABDOMEN PELVIS FINDINGS  Hepatobiliary: Evaluation of the liver is limited by lack of IV contrast. There are indeterminate hypoattenuating lesions in the liver measuring up to approximately 3.4 cm. The gallbladder is mildly distended.There is no biliary ductal dilation. Pancreas: Normal contours without ductal dilatation. No peripancreatic fluid collection. Spleen: Unremarkable. Adrenals/Urinary Tract: --Adrenal glands: Unremarkable. --Right kidney/ureter: No hydronephrosis or radiopaque kidney stones. --Left kidney/ureter: No hydronephrosis or radiopaque kidney stones. --Urinary bladder: Unremarkable. Stomach/Bowel: --Stomach/Duodenum: There is a large hiatal hernia. --Small bowel: Unremarkable. --Colon: There is scattered colonic diverticula without CT evidence for diverticulitis. --Appendix: Not visualized. No right lower quadrant inflammation or free fluid. Vascular/Lymphatic: Atherosclerotic calcification is present within the non-aneurysmal abdominal aorta, without hemodynamically significant stenosis. --No retroperitoneal lymphadenopathy. --No mesenteric lymphadenopathy. --No pelvic or inguinal lymphadenopathy. Reproductive: There is a fat containing left ovarian lesion measuring approximately 4.3 cm (axial series 3, image 73). Other: No ascites or free air. The abdominal wall is normal. Musculoskeletal. There are degenerative changes throughout the thoracolumbar spine. There is a degenerative dextroscoliosis of the lumbar spine. IMPRESSION: 1. Examination is severely limited by extensive motion artifact and lack of IV contrast. 2. No definite acute abnormality given the above limitations. Specifically, there is no left-sided pneumothorax as was identified on the patient's recent chest x-ray. Additionally, there is no definite displaced left-sided rib fracture, however evaluation is severely limited by motion artifact. 3. There are multiple indeterminate hypoattenuating masses in the liver. Further evaluation with a contrast enhanced  CT or MRI is recommended when the patient's condition improves in the patient is able to follow commands and breathing instructions. 4. 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 Recommend annual imaging followup by CTA or MRA. This recommendation follows 2010 ACCF/AHA/AATS/ACR/ASA/SCA/SCAI/SIR/STS/SVM Guidelines for the Diagnosis and Management of Patients with Thoracic Aortic Disease. Circulation. 2010; 121: K088-P103. Aortic aneurysm NOS (ICD10-I71.9) 5. Large hiatal hernia. 6. Diverticulosis without CT evidence for diverticulitis. 7. 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 8.  Aortic Atherosclerosis (ICD10-I70.0). Aortic Atherosclerosis (ICD10-I70.0). Electronically Signed   By: Katherine Mantle M.D.   On: 05/19/2020 17:16   Portable chest 1 View  Result Date: 05/19/2020 CLINICAL DATA:  Unwitnessed fall EXAM: PORTABLE CHEST 1 VIEW COMPARISON:  05/19/2020 FINDINGS: No focal opacity or pleural effusion. Low lung volumes. Stable cardiomediastinal silhouette. No pneumothorax is seen. IMPRESSION: No active disease. Low lung volumes. Electronically Signed   By: Jasmine Pang M.D.   On: 05/19/2020 23:43   DG Chest Portable 1 View  Result Date: 05/19/2020 CLINICAL DATA:  Low O2 sats. EXAM: PORTABLE CHEST 1 VIEW COMPARISON:  None. FINDINGS: There appear to be nondisplaced fractures involving the anterolateral sixth and seventh ribs on the left. There is a questionable small primarily basilar left-sided pneumothorax. The heart size is enlarged. Aortic calcifications are noted. There may be a small left-sided pleural effusion. The right-sided lung field appears clear. There are end-stage degenerative changes of  the left glenohumeral joint. IMPRESSION: 1. Probable nondisplaced left-sided rib fractures with a questionable basilar left-sided pneumothorax. Evaluation is somewhat limited by supine technique. Consider a short interval follow-up  chest x-ray or CT for further evaluation. 2. Cardiomegaly. These results were called by telephone at the time of interpretation on 05/19/2020 at 3:13 pm to provider [facility] PATEL , who verbally acknowledged these results. Electronically Signed   By: Katherine Mantle M.D.   On: 05/19/2020 15:13    Pending Labs Unresulted Labs (From admission, onward) Comment          Start     Ordered   05/26/20 0500  Creatinine, serum  (enoxaparin (LOVENOX)    CrCl >/= 30 ml/min)  Weekly,   R     Comments: while on enoxaparin therapy    05/19/20 1814   05/20/20 0500  Comprehensive metabolic panel  Tomorrow morning,   R        05/19/20 1814   05/20/20 0500  CBC  Tomorrow morning,   R        05/19/20 1814   05/19/20 1814  CBC  (enoxaparin (LOVENOX)    CrCl >/= 30 ml/min)  Once,   STAT       Comments: Baseline for enoxaparin therapy IF NOT ALREADY DRAWN.  Notify MD if PLT < 100 K.    05/19/20 1814   05/19/20 1814  Creatinine, serum  (enoxaparin (LOVENOX)    CrCl >/= 30 ml/min)  Once,   STAT       Comments: Baseline for enoxaparin therapy IF NOT ALREADY DRAWN.    05/19/20 1814   05/19/20 1350  Urinalysis, Complete w Microscopic  ONCE - STAT,   STAT        05/19/20 1351          Vitals/Pain Today's Vitals   05/19/20 2016 05/19/20 2208 05/19/20 2300 05/20/20 0020  BP: (!) 120/98 (!) 102/48 100/64 100/82  Pulse: 67 64 71 65  Resp: 15 18 18 18   Temp:      TempSrc:      SpO2: 94% 100% 95% 95%  PainSc: Asleep       Isolation Precautions No active isolations  Medications Medications  enoxaparin (LOVENOX) injection 30 mg (has no administration in time range)  acetaminophen (TYLENOL) tablet 650 mg (has no administration in time range)    Or  acetaminophen (TYLENOL) suppository 650 mg (has no administration in time range)  ondansetron (ZOFRAN) tablet 4 mg (has no administration in time range)    Or  ondansetron (ZOFRAN) injection 4 mg (has no administration in time range)  albuterol  (PROVENTIL) (2.5 MG/3ML) 0.083% nebulizer solution 2.5 mg (has no administration in time range)  atorvastatin (LIPITOR) tablet 40 mg (has no administration in time range)  carvedilol (COREG) tablet 6.25 mg (0 mg Oral Hold 05/19/20 2123)  escitalopram (LEXAPRO) tablet 20 mg (has no administration in time range)  ferrous sulfate tablet 324 mg (has no administration in time range)  LORazepam (ATIVAN) tablet 0.5 mg (has no administration in time range)  levothyroxine (SYNTHROID) tablet 112 mcg (has no administration in time range)  NIFEdipine (PROCARDIA-XL/NIFEDICAL-XL) 24 hr tablet 30 mg (has no administration in time range)  pantoprazole (PROTONIX) EC tablet 40 mg (has no administration in time range)  polyethylene glycol (MIRALAX / GLYCOLAX) packet 17 g (has no administration in time range)  0.9 %  sodium chloride infusion ( Intravenous New Bag/Given 05/19/20 2151)  QUEtiapine (SEROQUEL) tablet 25 mg (has no administration in  time range)    And  QUEtiapine (SEROQUEL) tablet 100 mg (has no administration in time range)  sodium chloride (PF) 0.9 % injection (has no administration in time range)  LORazepam (ATIVAN) injection 0.5 mg (0.5 mg Intravenous Given 05/19/20 1546)  sodium chloride 0.9 % bolus 1,000 mL (0 mLs Intravenous Stopped 05/19/20 1908)    Mobility walks with person assist High fall risk   Focused Assessments Renal Assessment Handoff:    R Recommendations: See Admitting Provider Note  Report given to: Pam RN  Additional Notes:

## 2020-05-20 NOTE — Progress Notes (Signed)
CRITICAL VALUE ALERT  Critical Value:  MRSA + PCR  Date & Time Notied:  05/20/2020 1310  Provider Notified: Dr. Jonathon Bellows  Orders Received/Actions taken: MRSA + Standing Orders Placed

## 2020-05-20 NOTE — Consult Note (Signed)
Consultation Note Date: 05/20/2020   Patient Name: Kristin Grimes  DOB: 12-05-1940  MRN: [id number]  Age / Sex: 79 y.o., female  PCP: Patient, No Pcp Per Referring Physician: Lanae Boast, MD  Reason for Consultation: Establishing goals of care  HPI/Patient Profile: 79 y.o. female  admitted on 05/19/2020   Kristin Grimes is a 79 y.o. female with medical history significant for advanced dementia on Ativan, Seroquel, Celexa, recent admission and discharge on June/28 for dehydration/altered mental status/possible syncopal event, CKD stage IIIa Baseline creatinine around 1, hypertension on nifedipine, lisinopril, Coreg, hypothyroidism, hyperlipidemia found on the floor beside the [facility] at the nursing home and sent to the ED.On previous admission patient was seen by palliative care for, prognosis was felt to be guarded if she had ongoing altered mental status.Patient and husband had recently moved from [place] and she had some baseline dementia there has been going downhill since.  Clinical Assessment and Goals of Care:  Patient admitted this time after a fall from her wheel chair, also with slight AKI. Admitted to hospital medicine service, undergoing efforts at stabilization. PMT saw in previous hospitalization as well.   Palliative medicine is specialized medical care for people living with serious illness. It focuses on providing relief from the symptoms and stress of a serious illness. The goal is to improve quality of life for both the patient and the family.  Goals of care: Broad aims of medical therapy in relation to the patient's values and preferences. Our aim is to provide medical care aimed at enabling patients to achieve the goals that matter most to them, given the circumstances of their particular medical situation and their constraints.   Awakens easily, resting in bed, doesn't verbalize much. In no  distress, husband arrived at bedside later on in the day.   NEXT OF KIN  husband, son and daughter.   SUMMARY OF RECOMMENDATIONS   Agree with DNR Patient known to PMT service, recommend palliative follow up at her facility upon discharge.  Continue current mode of care. Patient on on psych medications Seroquel, Lexapro and also on Ativan. She was seen by psych in last hospitalization. Monitor PO intake and OOB. Overall with ongoing decline in functional status which is slow progressive in nature, due to her dementia.  Thank you for the consult.   Code Status/Advance Care Planning:  DNR    Symptom Management:    as above.   Palliative Prophylaxis:   Delirium Protocol   Psycho-social/Spiritual:   Desire for further Chaplaincy support:yes  Additional Recommendations: Caregiving  Support/Resources  Prognosis:   Unable to determine  Discharge Planning: memory care unit with palliative.       Primary Diagnoses: Present on Admission: . AKI (acute kidney injury) (HCC)   I have reviewed the medical record, interviewed the patient and family, and examined the patient. The following aspects are pertinent.  Past Medical History:  Diagnosis Date  . Hypothyroidism    Social History   Socioeconomic History  . Marital status: Married    Spouse  name: Not on file  . Number of children: Not on file  . Years of education: Not on file  . Highest education level: Not on file  Occupational History  . Not on file  Tobacco Use  . Smoking status: Former Smoker    Types: Cigarettes  . Smokeless tobacco: Never Used  Vaping Use  . Vaping Use: Never used  Substance and Sexual Activity  . Alcohol use: Yes    Comment: occasional  . Drug use: Never  . Sexual activity: Not on file  Other Topics Concern  . Not on file  Social History Narrative  . Not on file   Social Determinants of Health   Financial Resource Strain:   . Difficulty of Paying Living Expenses:   Food  Insecurity:   . Worried About Programme researcher, broadcasting/film/video in the Last Year:   . Barista in the Last Year:   Transportation Needs:   . Freight forwarder (Medical):   Marland Kitchen Lack of Transportation (Non-Medical):   Physical Activity:   . Days of Exercise per Week:   . Minutes of Exercise per Session:   Stress:   . Feeling of Stress :   Social Connections:   . Frequency of Communication with Friends and Family:   . Frequency of Social Gatherings with Friends and Family:   . Attends Religious Services:   . Active Member of Clubs or Organizations:   . Attends Banker Meetings:   Marland Kitchen Marital Status:    History reviewed. No pertinent family history. Scheduled Meds: . atorvastatin  40 mg Oral QHS  . [START ON 05/21/2020] Chlorhexidine Gluconate Cloth  6 each Topical Q0600  . enoxaparin (LOVENOX) injection  30 mg Subcutaneous Q24H  . escitalopram  20 mg Oral Daily  . feeding supplement (ENSURE ENLIVE)  237 mL Oral BID BM  . ferrous sulfate  324 mg Oral Q breakfast  . levothyroxine  112 mcg Oral Q0600  . multivitamin with minerals  1 tablet Oral Daily  . mupirocin ointment  1 application Nasal BID  . pantoprazole  40 mg Oral Daily  . polyethylene glycol  17 g Oral Daily  . QUEtiapine  25 mg Oral Daily   And  . QUEtiapine  100 mg Oral QHS   Continuous Infusions: . sodium chloride 125 mL/hr at 05/20/20 1340   PRN Meds:.acetaminophen **OR** acetaminophen, albuterol, LORazepam, ondansetron **OR** ondansetron (ZOFRAN) IV Medications Prior to Admission:  Prior to Admission medications   Medication Sig Start Date End Date Taking? Authorizing Provider  acetaminophen (TYLENOL) 325 MG tablet Take 650 mg by mouth every 8 (eight) hours. (0600, 1400, & 2200)   Yes [provider]  acetaminophen (TYLENOL) 325 MG tablet Take 650 mg by mouth every 6 (six) hours as needed for mild pain, moderate pain or fever.   Yes [provider]  atorvastatin (LIPITOR) 40 MG tablet  Take 40 mg by mouth at bedtime. (2000)   Yes [provider]  carvedilol (COREG) 6.25 MG tablet Take 1 tablet (6.25 mg total) by mouth 2 (two) times daily with a meal. Patient taking differently: Take 6.25 mg by mouth 2 (two) times daily with a meal. (0800 & 2000) 05/09/20 06/08/20 Yes Narda Bonds, MD  cholecalciferol (VITAMIN D3) 25 MCG (1000 UNIT) tablet Take 1,000 Units by mouth daily. (0800)   Yes [provider]  escitalopram (LEXAPRO) 20 MG tablet Take 20 mg by mouth daily. (0800)   Yes [provider]  ferrous sulfate 324 MG TBEC Take 324 mg by mouth. (0800)   Yes [provider]  levothyroxine (SYNTHROID) 112 MCG tablet Take 112 mcg by mouth daily before breakfast. (0800)   Yes [provider]  lisinopril (ZESTRIL) 40 MG tablet Take 40 mg by mouth daily. (0800)   Yes [provider]  LORazepam (ATIVAN) 0.5 MG tablet Take 1 tablet (0.5 mg total) by mouth 2 (two) times daily as needed for anxiety or sleep. 05/09/20  Yes Narda Bonds, MD  LORazepam 1 MG/0.5ML CONC Take 1 mg by mouth every 6 (six) hours as needed (anxiety). Pre-filled syringes   Yes [provider]  Morphine Sulfate (MORPHINE CONCENTRATE) 10 mg / 0.5 ml concentrated solution Take 10 mg by mouth every 4 (four) hours as needed for shortness of breath (air hunger).   Yes [provider]  Multiple Vitamin (MULTIVITAMIN WITH MINERALS) TABS tablet Take 1 tablet by mouth daily. (0800)   Yes [provider]  NIFEdipine (ADALAT CC) 30 MG 24 hr tablet Take 30 mg by mouth daily. (0800)   Yes [provider]  ondansetron (ZOFRAN) 8 MG tablet Take 8 mg by mouth every 8 (eight) hours as needed for nausea or vomiting.   Yes [provider]  pantoprazole (PROTONIX) 40 MG tablet Take 40 mg by mouth daily. (0800)   Yes [provider]  polyethylene glycol (MIRALAX / GLYCOLAX) 17 g packet Take 17 g by mouth daily. Mix in 8 ounces of fluid.  (0800)   Yes [provider]  QUEtiapine (SEROQUEL) 25 MG tablet Take 25-50 mg by mouth See admin instructions. Take 1 tablet (25mg ) by mouth daily in the morning (0800) and take 2 tablets (100mg ) by mouth daily in the evening (2000).   Yes [provider]  vitamin B-12 (CYANOCOBALAMIN) 500 MCG tablet Take 500 mcg by mouth daily. (0800)   Yes [provider]   No Known Allergies Review of Systems Non verbal with me.   Physical Exam No distress Appears frail and weak Eyes open lying on her side Regular work of breathing S1 S2 Abdomen is not tender No edema  Vital Signs: BP (!) 93/45 (BP Location: Left Arm)   Pulse 66   Temp 97.6 F (36.4 C) (Axillary)   Resp 18   Ht 5\' 5"  (1.651 m)   Wt 83.3 kg   SpO2 91%   BMI 30.56 kg/m  Pain Scale: PAINAD   Pain Score: Asleep   SpO2: SpO2: 91 % O2 Device:SpO2: 91 % O2 Flow Rate: .O2 Flow Rate (L/min): 2 L/min  IO: Intake/output summary:   Intake/Output Summary (Last 24 hours) at 05/20/2020 1509 Last data filed at 05/20/2020 1032 Gross per 24 hour  Intake 1377.89 ml  Output --  Net 1377.89 ml    LBM: Last BM Date:  (PTA) Baseline Weight: Weight: 83.3 kg Most recent weight: Weight: 83.3 kg     Palliative Assessment/Data:   PPS 40%  Time In:  1400 Time Out:  1500 Time Total:  60  Greater than 50%  of this time was spent counseling and coordinating care related to the above assessment and plan.  Signed by: Rosalin Hawking, MD   Please contact Palliative Medicine Team phone at [phone] for questions and concerns.  For individual provider: See Loretha Stapler

## 2020-05-21 DIAGNOSIS — F419 Anxiety disorder, unspecified: Secondary | ICD-10-CM

## 2020-05-21 DIAGNOSIS — F0390 Unspecified dementia without behavioral disturbance: Secondary | ICD-10-CM

## 2020-05-21 LAB — BASIC METABOLIC PANEL
Anion gap: 7 (ref 5–15)
BUN: 42 mg/dL — ABNORMAL HIGH (ref 8–23)
CO2: 23 mmol/L (ref 22–32)
Calcium: 9 mg/dL (ref 8.9–10.3)
Chloride: 118 mmol/L — ABNORMAL HIGH (ref 98–111)
Creatinine, Ser: 1.16 mg/dL — ABNORMAL HIGH (ref 0.44–1.00)
GFR calc Af Amer: 53 mL/min — ABNORMAL LOW (ref >60)
GFR calc non Af Amer: 45 mL/min — ABNORMAL LOW (ref >60)
Glucose, Bld: 145 mg/dL — ABNORMAL HIGH (ref 70–99)
Potassium: 4.2 mmol/L (ref 3.5–5.1)
Sodium: 148 mmol/L — ABNORMAL HIGH (ref 135–145)

## 2020-05-21 LAB — CBC
HCT: 38.3 % (ref 36.0–46.0)
Hemoglobin: 12 g/dL (ref 12.0–15.0)
MCH: 31.7 pg (ref 26.0–34.0)
MCHC: 31.3 g/dL (ref 30.0–36.0)
MCV: 101.1 fL — ABNORMAL HIGH (ref 80.0–100.0)
Platelets: 346 10*3/uL (ref 150–400)
RBC: 3.79 MIL/uL — ABNORMAL LOW (ref 3.87–5.11)
RDW: 15.1 % (ref 11.5–15.5)
WBC: 7.5 10*3/uL (ref 4.0–10.5)
nRBC: 0 % (ref 0.0–0.2)

## 2020-05-21 LAB — TSH: TSH: 31.151 u[IU]/mL — ABNORMAL HIGH (ref 0.350–4.500)

## 2020-05-21 MED ORDER — ESCITALOPRAM OXALATE 20 MG PO TABS
20.0000 mg | ORAL_TABLET | Freq: Every day | ORAL | Status: DC
  Administered 2020-05-22 – 2020-05-23 (×2): 20 mg via ORAL
  Filled 2020-05-21 (×2): qty 1

## 2020-05-21 MED ORDER — HALOPERIDOL 0.5 MG PO TABS
0.5000 mg | ORAL_TABLET | Freq: Two times a day (BID) | ORAL | Status: DC | PRN
  Filled 2020-05-21: qty 1

## 2020-05-21 MED ORDER — ENOXAPARIN SODIUM 40 MG/0.4ML ~~LOC~~ SOLN
40.0000 mg | SUBCUTANEOUS | Status: DC
  Administered 2020-05-21 – 2020-05-23 (×3): 40 mg via SUBCUTANEOUS
  Filled 2020-05-21 (×3): qty 0.4

## 2020-05-21 MED ORDER — SODIUM CHLORIDE 0.9 % IV SOLN
1.0000 g | INTRAVENOUS | Status: DC
  Administered 2020-05-21: 1 g via INTRAVENOUS
  Filled 2020-05-21: qty 1

## 2020-05-21 MED ORDER — LEVOTHYROXINE SODIUM 25 MCG PO TABS
125.0000 ug | ORAL_TABLET | Freq: Every day | ORAL | Status: DC
  Administered 2020-05-22 – 2020-05-23 (×2): 125 ug via ORAL
  Filled 2020-05-21 (×2): qty 1

## 2020-05-21 NOTE — Progress Notes (Signed)
PMT brief progress note.   Patient seen briefly when she was being bathed this am, was with bedside staff who were assisting her, patient yelling out loud at times.   BP (!) 149/91 (BP Location: Right Arm)    Pulse 67    Temp 98.1 F (36.7 C) (Oral)    Resp 20    Ht 5\' 5"  (1.651 m)    Wt 83.3 kg    SpO2 98%    BMI 30.56 kg/m  Labs and imaging noted Chart reviewed Discussed with Dr [facility] MD. We reviewed the patient's hospital course and next steps.  Psych consult has been requested.  Awake more alert Had low BP yesterday Regular work of breathing Intermittent agitation at times No edema PPS 40%  Advanced dementia with episodic agitation S/p fall at her facility AKI and metabolic acidosis, on IVF.   Plan: Continue supportive care Patient will benefit from palliative support upon discharge. Not residential hospice currently, in my opinion.   15 minutes spent [facility] MD [facility] palliative [phone].
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
## 2020-05-21 NOTE — Progress Notes (Addendum)
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 somewhat limited by supine technique. Consider a short interval follow-up chest x-ray or CT for further evaluation. 2. Cardiomegaly. These results were called by telephone at the time of interpretation on 05/19/2020 at 3:13 pm to provider [facility] PATEL , who verbally acknowledged these results. Electronically Signed   By: Katherine Mantle M.D.   On: 05/19/2020 15:13     LOS: 1 day   Lanae Boast, MD [facility]  05/21/2020, 12:44 PM

## 2020-05-22 ENCOUNTER — Inpatient Hospital Stay (HOSPITAL_COMMUNITY): Payer: Medicare PPO

## 2020-05-22 DIAGNOSIS — Z7189 Other specified counseling: Secondary | ICD-10-CM

## 2020-05-22 DIAGNOSIS — R531 Weakness: Secondary | ICD-10-CM

## 2020-05-22 DIAGNOSIS — Z515 Encounter for palliative care: Secondary | ICD-10-CM

## 2020-05-22 LAB — URINE CULTURE: Culture: >=100000 — AB

## 2020-05-22 LAB — BASIC METABOLIC PANEL
Anion gap: 7 (ref 5–15)
BUN: 27 mg/dL — ABNORMAL HIGH (ref 8–23)
CO2: 22 mmol/L (ref 22–32)
Calcium: 8.9 mg/dL (ref 8.9–10.3)
Chloride: 119 mmol/L — ABNORMAL HIGH (ref 98–111)
Creatinine, Ser: 1.03 mg/dL — ABNORMAL HIGH (ref 0.44–1.00)
GFR calc Af Amer: >60 mL/min (ref >60)
GFR calc non Af Amer: 52 mL/min — ABNORMAL LOW (ref >60)
Glucose, Bld: 106 mg/dL — ABNORMAL HIGH (ref 70–99)
Potassium: 3.9 mmol/L (ref 3.5–5.1)
Sodium: 148 mmol/L — ABNORMAL HIGH (ref 135–145)

## 2020-05-22 LAB — CBC
HCT: 32.9 % — ABNORMAL LOW (ref 36.0–46.0)
Hemoglobin: 10.5 g/dL — ABNORMAL LOW (ref 12.0–15.0)
MCH: 31.3 pg (ref 26.0–34.0)
MCHC: 31.9 g/dL (ref 30.0–36.0)
MCV: 98.2 fL (ref 80.0–100.0)
Platelets: 290 10*3/uL (ref 150–400)
RBC: 3.35 MIL/uL — ABNORMAL LOW (ref 3.87–5.11)
RDW: 15.2 % (ref 11.5–15.5)
WBC: 6.2 10*3/uL (ref 4.0–10.5)
nRBC: 0 % (ref 0.0–0.2)

## 2020-05-22 MED ORDER — SODIUM CHLORIDE 0.9 % IV SOLN
1.0000 g | Freq: Two times a day (BID) | INTRAVENOUS | Status: DC
  Administered 2020-05-22 – 2020-05-23 (×4): 1 g via INTRAVENOUS
  Filled 2020-05-22 (×5): qty 1

## 2020-05-22 NOTE — Progress Notes (Signed)
PMT brief progress note.   Patient seen briefly when she was resting in bed, asleep, does not arouse to gentle voice stimulation, no signs of distress or discomfort, breathing is regular.     BP (!) 139/118 (BP Location: Right Arm)   Pulse 94   Temp 98.3 F (36.8 C)   Resp 19   Ht 5\' 5"  (1.651 m)   Wt 83.3 kg   SpO2 97%   BMI 30.56 kg/m  Labs and imaging noted Chart reviewed   Psych consult has been completed and their recommendations noted.   currently asleep Regular work of breathing No edema PPS 40%  Advanced dementia with episodic agitation S/p fall at her facility AKI and metabolic acidosis, on IVF.   Plan: Continue supportive care, psych meds adjusted as per psych.  Briefly discussed with hospice liaison, patient could likely be considered for hospice support at her facility on discharge. Her prognosis could be 6 months or less, given her current and underlying conditions. Patient has advanced dementia with ongoing progressive decline and frequent hospitalizations. Remains at high risk for ongoing decompensation in my opinion.   15 minutes spent MD [facility] health palliative [phone].
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
## 2020-05-22 NOTE — Progress Notes (Signed)
Patent examiner [facility]) Hospital Liaison: RN note    Notified by Transition of Care Manger of patient/family request for [facility] services at home after discharge. Chart and patient information under review by [facility] physician. Hospice eligibility pending currently.    Spoke with spouse Molly Maduro and daughter Misty Stanley    to initiate education related to hospice philosophy, services and team approach to care. Both verbalized understanding of information given. Molly Maduro and Misty Stanley have had several conversations about Deniya disease progression leading towards hospice. Both are concerned with patients decline. I have given education about EOL and discussed hospital readmissions, poor appetite, and worsening underlying conditions were signs of disease progression per Palliative MD with possible time frame of 6 months or less. Family seemed a little shocked but of acceptance. Would like hospice to be initiated within [facility].  I also discussed with Melissa at [facility] place that the family has agreed on hospice and that they would like a Wheelchair. Melissa stated that she had ordered one and was going to check on the status of that prior to patient discharge from hospital. She is concerned with mentation of the patient and if appropriate for return. She will do another assessment of patient prior to discharge but will more than likely accept patient back. Family is aware of this.   Per discussion, plan is for discharge to [facility] by [facility]). Discharge date is unknown at this time. [facility] will follow up.   Please issend signed and completed DNR form home with patient/family. Patient will need prescriptions for discharge comfort medications.        [facility] aware of the above. Please notify [facility] when patient is ready to leave the unit at discharge. (Call [phone] or [phone] after 5pm.) [facility] information and contact numbers given to spouse and daughter.      Please call with any hospice  related questions.     Thank you for this referral.     Yolande Jolly, RN, [facility] (listed on AMION under [facility] [place])  [phone]

## 2020-05-22 NOTE — Plan of Care (Signed)
  Problem: Elimination: Goal: Will not experience complications related to urinary retention Outcome: Completed/Met  Pt incontinent

## 2020-05-23 LAB — BASIC METABOLIC PANEL
Anion gap: 6 (ref 5–15)
BUN: 16 mg/dL (ref 8–23)
CO2: 23 mmol/L (ref 22–32)
Calcium: 8.5 mg/dL — ABNORMAL LOW (ref 8.9–10.3)
Chloride: 111 mmol/L (ref 98–111)
Creatinine, Ser: 0.97 mg/dL (ref 0.44–1.00)
GFR calc Af Amer: >60 mL/min (ref >60)
GFR calc non Af Amer: 56 mL/min — ABNORMAL LOW (ref >60)
Glucose, Bld: 99 mg/dL (ref 70–99)
Potassium: 3.9 mmol/L (ref 3.5–5.1)
Sodium: 140 mmol/L (ref 135–145)

## 2020-05-23 LAB — CBC
HCT: 32.9 % — ABNORMAL LOW (ref 36.0–46.0)
Hemoglobin: 10.7 g/dL — ABNORMAL LOW (ref 12.0–15.0)
MCH: 31.9 pg (ref 26.0–34.0)
MCHC: 32.5 g/dL (ref 30.0–36.0)
MCV: 98.2 fL (ref 80.0–100.0)
Platelets: 267 10*3/uL (ref 150–400)
RBC: 3.35 MIL/uL — ABNORMAL LOW (ref 3.87–5.11)
RDW: 14.9 % (ref 11.5–15.5)
WBC: 7 10*3/uL (ref 4.0–10.5)
nRBC: 0 % (ref 0.0–0.2)

## 2020-05-23 MED ORDER — SODIUM CHLORIDE 0.9 % IV SOLN
INTRAVENOUS | Status: DC | PRN
  Administered 2020-05-23: 250 mL via INTRAVENOUS

## 2020-05-23 MED ORDER — NITROFURANTOIN MONOHYD MACRO 100 MG PO CAPS
100.0000 mg | ORAL_CAPSULE | Freq: Two times a day (BID) | ORAL | Status: DC
  Administered 2020-05-23 – 2020-05-24 (×2): 100 mg via ORAL
  Filled 2020-05-23 (×2): qty 1

## 2020-05-23 NOTE — TOC Progression Note (Signed)
Transition of Care [facility]) - Progression Note    Patient Details  Name: Kristin Grimes MRN: [id number] Date of Birth: 10-03-1941  Transition of Care [facility]) CM/SW Contact  Neidra Girvan, Olegario Messier, RN Phone Number: 05/23/2020, 3:02 PM  Clinical Narrative:d/c plan return back to [facility] memory care with [facility] care-hospice services.       Expected Discharge Plan: Memory Care Barriers to Discharge: Continued Medical Work up  Expected Discharge Plan and Services Expected Discharge Plan: Memory Care In-house Referral: Clinical Social Work     Living arrangements for the past 2 months: Assisted Living Facility Expected Discharge Date:  (unknown)                                     Social Determinants of Health (SDOH) Interventions    Readmission Risk Interventions Readmission Risk Prevention Plan 04/18/2020  Post Dischage Appt Complete  Medication Screening Complete  Transportation Screening Complete

## 2020-05-23 NOTE — Care Management Important Message (Signed)
Important Message  Patient Details IM Letter given to Lanier Clam RN Case Manager to present to the Patient Name: Kristin Grimes MRN: [id number] Date of Birth: 1941-08-22   Medicare Important Message Given:  Yes     Caren Macadam 05/23/2020, 12:04 PM
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
## 2020-05-23 NOTE — Progress Notes (Addendum)
Civil engineer, contracting [facility])  Plan to d/c to [facility] with hospice services.  No DME needed per [facility].  Thank you, Wallis Bamberg RN, BSN, CCRN [facility] Liaison

## 2020-05-23 NOTE — Progress Notes (Signed)
[facility] ([facility]), Hospice  Spoke with spouse to confirm plans to d/c back to [facility] with hospice.  He was upset, feeling that no one had communicated with him.  I advised that several attempts had been made to reach him.  He was in the pt room most of the day today.    Please call Molly Maduro on his cell phone, after 1030 am--he states he has a block on his phone until after 1030 am each day.  He is in favor of his wife returning to [facility] with hospice.  [facility] will f/u with him in the am after 1030 to make sure d/c plans are still ok.  Wallis Bamberg RN, BSN, CCRN [facility] Liaison

## 2020-05-23 NOTE — Progress Notes (Signed)
PMT brief progress note.   Patient seen briefly when she was resting in bed, opens eyes, verbalizes some.  Interact some.  Complains of feeling cold and asked for extra covers.  Denies feeling hungry.  Is overall confused, not oriented to place or time.  Is able to state her name.  No family at bedside.     BP (!) 142/91 (BP Location: Left Wrist)   Pulse 62   Temp 97.7 F (36.5 C) (Oral)   Resp 20   Ht 5\' 5"  (1.651 m)   Wt 83.3 kg   SpO2 98%   BMI 30.56 kg/m  Labs and imaging noted Chart reviewed   Psych consult has been completed and their recommendations noted.   currently laying in bed on the side.  Abdomen not tender S1 S2 Regular work of breathing No edema PPS 40%  Advanced dementia with episodic agitation S/p fall at her facility AKI and metabolic acidosis, on IVF.   Plan: Continue supportive care, psych meds adjusted as per psych.  Discussed with hospice liaison on 05-22-20, patient could likely be considered for hospice support at her facility on discharge. Her prognosis could be 6 months or less, given her current and underlying conditions. Patient has advanced dementia with ongoing progressive decline and frequent hospitalizations. Remains at high risk for ongoing decompensation in my opinion.  No further recommendations from a palliative standpoint other than having hospice support at [facility] memory care unit, continuing to support the patient in her decline trajectory from a dementia standpoint, providing safe familiar environment, encouraging p.o., monitoring and treating for symptoms.  15 minutes spent [facility] MD [facility] health palliative [phone].

## 2020-05-23 NOTE — NC FL2 (Signed)
[place] MEDICAID FL2 LEVEL OF CARE SCREENING TOOL     IDENTIFICATION  Patient Name: Kristin Grimes Birthdate: October 28, 1941 Sex: female Admission Date (Current Location): 05/19/2020  [facility] and [place] Number:      Facility and Address:  [facility],  [address], [place] [address]      Provider Number: [id number]  Attending Physician Name and Address:  Lanae Boast, MD  Relative Name and Phone Number:  Verlia Kaney [phone]    Current Level of Care: Hospital Recommended Level of Care: Memory Care Prior Approval Number:    Date Approved/Denied:   PASRR Number: [id number]  Discharge Plan: Other (Comment) (Memory Care)    Current Diagnoses: Patient Active Problem List   Diagnosis Date Noted  . Palliative care by specialist   . Goals of care, counseling/discussion   . General weakness   . AKI (acute kidney injury) (HCC) 05/19/2020  . Esophageal hernia   . Palliative care encounter   . DNR (do not resuscitate)   . Dementia with behavioral disturbance (HCC) 04/21/2020  . Advanced dementia (HCC) 04/19/2020  . Altered mental status   . Failure to thrive in adult   . Dysphagia   . Acute metabolic encephalopathy 04/15/2020    Orientation RESPIRATION BLADDER Height & Weight     Self  Normal Incontinent Weight: 83.3 kg Height:  5\' 5"  (165.1 cm)  BEHAVIORAL SYMPTOMS/MOOD NEUROLOGICAL BOWEL NUTRITION STATUS      Incontinent Diet (Dysphagia 1 thin liq)  AMBULATORY STATUS COMMUNICATION OF NEEDS Skin   Extensive Assist Verbally Normal                       Personal Care Assistance Level of Assistance  Total care Bathing Assistance: Maximum assistance Feeding assistance: Maximum assistance   Total Care Assistance: Maximum assistance   Functional Limitations Info  Sight, Speech, Hearing Sight Info: Adequate Hearing Info: Adequate Speech Info: Adequate    SPECIAL CARE FACTORS FREQUENCY                       Contractures  Contractures Info: Not present    Additional Factors Info  Psychotropic, Allergies, Code Status Code Status Info: DNR Allergies Info: NKA Psychotropic Info: See MAR         Current Medications (05/23/2020):  This is the current hospital active medication list Current Facility-Administered Medications  Medication Dose Route Frequency Provider Last Rate Last Admin  . 0.9 %  sodium chloride infusion   Intravenous Continuous 07/24/2020, MD 50 mL/hr at 05/22/20 0808 New Bag at 05/22/20 07/23/20  . acetaminophen (TYLENOL) tablet 650 mg  650 mg Oral Q6H PRN 0175, MD   650 mg at 05/22/20 2307   Or  . acetaminophen (TYLENOL) suppository 650 mg  650 mg Rectal Q6H PRN Kc, Ramesh, MD      . albuterol (PROVENTIL) (2.5 MG/3ML) 0.083% nebulizer solution 2.5 mg  2.5 mg Nebulization Q2H PRN Kc, Ramesh, MD      . atorvastatin (LIPITOR) tablet 40 mg  40 mg Oral QHS Kc, 2308, MD   40 mg at 05/22/20 2307  . Chlorhexidine Gluconate Cloth 2 % PADS 6 each  6 each Topical Q0600 2308, MD   6 each at 05/23/20 0610  . enoxaparin (LOVENOX) injection 40 mg  40 mg Subcutaneous Q24H Kc, Ramesh, MD   40 mg at 05/22/20 2308  . escitalopram (LEXAPRO) tablet 20 mg  20 mg Oral QHS  Thedore Mins, MD   20 mg at 05/22/20 2308  . feeding supplement (ENSURE ENLIVE) (ENSURE ENLIVE) liquid 237 mL  237 mL Oral BID BM Kc, Ramesh, MD   237 mL at 05/23/20 0930  . ferrous sulfate tablet 324 mg  324 mg Oral Q breakfast Kc, Ramesh, MD   324 mg at 05/23/20 0929  . haloperidol (HALDOL) tablet 0.5 mg  0.5 mg Oral BID PRN Akintayo, Mojeed, MD      . levothyroxine (SYNTHROID) tablet 125 mcg  125 mcg Oral QAC breakfast Lanae Boast, MD   125 mcg at 05/23/20 0547  . LORazepam (ATIVAN) tablet 0.5 mg  0.5 mg Oral BID PRN Lanae Boast, MD   0.5 mg at 05/20/20 0948  . meropenem (MERREM) 1 g in sodium chloride 0.9 % 100 mL IVPB  1 g Intravenous Q12H Alwyn Ren, MD 200 mL/hr at 05/23/20 0928 1 g at 05/23/20 0928  . multivitamin with  minerals tablet 1 tablet  1 tablet Oral Daily Lanae Boast, MD   1 tablet at 05/23/20 0929  . mupirocin ointment (BACTROBAN) 2 % 1 application  1 application Nasal BID Lanae Boast, MD   1 application at 05/23/20 0928  . ondansetron (ZOFRAN) tablet 4 mg  4 mg Oral Q6H PRN Kc, Ramesh, MD   4 mg at 05/20/20 0948   Or  . ondansetron (ZOFRAN) injection 4 mg  4 mg Intravenous Q6H PRN Lanae Boast, MD   4 mg at 05/22/20 0929  . pantoprazole (PROTONIX) EC tablet 40 mg  40 mg Oral Daily Kc, Ramesh, MD   40 mg at 05/23/20 0930  . polyethylene glycol (MIRALAX / GLYCOLAX) packet 17 g  17 g Oral Daily Kc, Ramesh, MD   17 g at 05/23/20 0930  . QUEtiapine (SEROQUEL) tablet 100 mg  100 mg Oral QHS Lanae Boast, MD   100 mg at 05/22/20 2307     Discharge Medications: Please see discharge summary for a list of discharge medications.  Relevant Imaging Results:  Relevant Lab Results:   Additional Information SS# [id number] Care-Hospice services @ memory care  Tonetta Napoles, Olegario Messier, RN

## 2020-05-24 MED ORDER — ESCITALOPRAM OXALATE 20 MG PO TABS: 20.0000 mg | ORAL_TABLET | Freq: Every day | ORAL | Status: AC

## 2020-05-24 MED ORDER — NITROFURANTOIN MONOHYD MACRO 100 MG PO CAPS: 100.0000 mg | ORAL_CAPSULE | Freq: Two times a day (BID) | ORAL | 0 refills | Status: AC

## 2020-05-24 MED ORDER — ENSURE ENLIVE PO LIQD: 237.0000 mL | Freq: Two times a day (BID) | ORAL | 12 refills | Status: AC

## 2020-05-24 MED ORDER — LEVOTHYROXINE SODIUM 125 MCG PO TABS: 125.0000 ug | ORAL_TABLET | Freq: Every day | ORAL | Status: AC

## 2020-05-24 MED ORDER — HALOPERIDOL 0.5 MG PO TABS: 0.5000 mg | ORAL_TABLET | Freq: Two times a day (BID) | ORAL | Status: AC | PRN

## 2020-05-24 MED ORDER — QUETIAPINE FUMARATE 100 MG PO TABS: 100.0000 mg | ORAL_TABLET | Freq: Every day | ORAL | Status: AC

## 2020-05-24 NOTE — TOC Transition Note (Signed)
Transition of Care [facility]) - CM/SW Discharge Note   Patient Details  Name: Kristin Grimes MRN: [id number] Date of Birth: 1941-03-07  [facility]) CM/SW Contact:  Lanier Clam, RN Phone Number: 05/24/2020, 9:38 AM   Clinical Narrative: Spoke to spouse Kristin Grimes about d/c plans-many concerns about all the different people involved in the care-CM explained that for appropriate care, & services needed-many were involved-attending, palliative care,nursing,[facility](hospice)CM,etc-provided w/CM,& tel# care mgmnt office. Spouse voiced understanding, & agrees to d/c plan. [facility] for transport. Will fax d/c summary once available,await rm#, then call [facility]. No further CM needs.         Barriers to Discharge: No Barriers Identified   Patient Goals and CMS Choice        Discharge Placement              Patient chooses bed at:  [facility]memory care) Patient to be transferred to facility by: [facility] Name of family member notified: Kristin Grimes spouse Patient and family notified of of transfer: 05/24/20  Discharge Plan and Services In-house Referral: Clinical Social Work                        HH Arranged: RN HH Agency: [facility] Date HH Agency Contacted: 05/24/20 Time HH Agency Contacted: [phone] Representative spoke with at [facility] Agency: Melissa  Social Determinants of Health (SDOH) Interventions     Readmission Risk Interventions Readmission Risk Prevention Plan 04/18/2020  Post Dischage Appt Complete  Medication Screening Complete  Transportation Screening Complete

## 2020-05-24 NOTE — Discharge Summary (Signed)
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 diffusion imaging could be acquired, and this sequence is motion degraded. Negative for infarct, hydrocephalus, or midline shift. IMPRESSION: 1. Only motion diffusion imaging could be acquired due to patient condition. 2. No acute infarct. Electronically Signed   By: Marnee Spring M.D.   On: 04/24/2020 12:25   DG Chest Port 1 View  Result Date: 05/22/2020 CLINICAL DATA:  Abnormal x-ray , pneumomediastinum suggested on previous radiograph EXAM: PORTABLE CHEST - 1 VIEW COMPARISON:  05/20/2020 and previous FINDINGS: No evidence of pneumomediastinum on this portable semi erect radiograph with poor inspiratory effort. New left suprahilar airspace opacity. New blunting of left lateral costophrenic angle with increased interstitial opacities in the mid and lower left lung. Right lung clear. Heart size upper limits normal for technique. Retrocardiac double  density consistent with previously demonstrated hiatal hernia. Aortic Atherosclerosis (ICD10-170.0). No pneumothorax. Bilateral shoulder DJD.  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 IMPRESSION: 1. No evidence of pneumomediastinum. 2. New left suprahilar airspace opacity. New blunting of the left lateral costophrenic angle with increased interstitial opacities in the mid and lower left lung. Electronically Signed   By: Corlis Leak M.D.   On: 05/22/2020 07:58   DG CHEST PORT 1 VIEW  Result Date: 05/20/2020 CLINICAL DATA:  Shortness of breath. EXAM: PORTABLE CHEST 1 VIEW COMPARISON:  05/19/2020, 04/15/2020.  CT chest 05/19/2020. FINDINGS: Patient is rotated to the right. Subtle pneumomediastinum cannot be excluded. Follow-up PA and lateral chest x-ray suggested. Heart size stable. Mild subsegmental atelectasis right lung base. No pleural effusion or pneumothorax. Sliding hiatal hernia. Thoracic spine scoliosis. IMPRESSION: 1. Subtle pneumomediastinum cannot be excluded. Follow-up PA lateral chest x-ray suggested. 2.  Mild subsegmental atelectasis right lung base. These results will be called to the ordering clinician or representative by the Radiologist Assistant, and communication documented in the PACS or [employer]. Electronically Signed   By: Maisie Fus  Register   On: 05/20/2020 08:10   Portable chest 1 View  Result Date: 05/19/2020 CLINICAL DATA:  Unwitnessed fall EXAM: PORTABLE CHEST 1 VIEW COMPARISON:  05/19/2020 FINDINGS: No focal opacity or pleural effusion. Low lung volumes. Stable cardiomediastinal silhouette. No pneumothorax is seen. IMPRESSION: No active disease. Low lung volumes. Electronically Signed   By: Jasmine Pang M.D.   On: 05/19/2020 23:43   DG Chest Portable 1 View  Result Date: 05/19/2020 CLINICAL DATA:  Low O2 sats. EXAM: PORTABLE CHEST 1 VIEW COMPARISON:  None. FINDINGS: There appear to be nondisplaced fractures involving the anterolateral sixth and seventh ribs on the left. There is a  questionable small primarily basilar left-sided pneumothorax. The heart size is enlarged. Aortic calcifications are noted. There may be a small left-sided pleural effusion. The right-sided lung field appears clear. There are end-stage degenerative changes of the left glenohumeral joint. IMPRESSION: 1. Probable nondisplaced left-sided rib fractures with a questionable basilar left-sided pneumothorax. Evaluation is somewhat limited by supine technique. Consider a short interval follow-up chest x-ray or CT for further evaluation. 2. Cardiomegaly. These results were called by telephone at the time of interpretation on 05/19/2020 at 3:13 pm to provider [facility] PAT[facility] , who verbally acknowledged these results. Electronically Signed   By: Katherine Mantle M.D.   On: 05/19/2020 15:13   EEG adult  Result Date: 04/25/2020 Charlsie Quest, MD     04/25/2020  2:59 PM Patient Name: Allyssia Skluzacek MRN: [id number] Epilepsy Attending: Charlsie Quest Referring Physician/Provider: Dr. Lanae Boast Date: 04/25/2020 Duration: 25.26 minutes Patient history: 79 year old female with advanced dementia presented with altered mental status.  EEG evaluate for seizures. Level of alertness: Awake AEDs during EEG study: Depakote Technical aspects: This EEG study was done with scalp electrodes positioned according to the 10-20 International system of electrode placement. Electrical activity was acquired at a sampling rate of 500Hz  and reviewed with a high frequency filter of 70Hz  and a low frequency filter of 1Hz . EEG data were recorded continuously and digitally stored. Description: No posterior dominant rhythm was seen.  EEG showed continuous generalized 3 to 5 Hz theta-delta slowing.  Hyperventilation and photic stimulation were not performed.   Of note, EEG was technically difficult due to significant movement artifact as patient was unable to cooperate. ABNORMALITY -Continuous slow, generalized IMPRESSION: This technically difficult  study is suggestive of moderate diffuse encephalopathy, nonspecific. No seizures or epileptiform discharges were seen throughout the recording. Priyanka O Yadav    Labs: BNP (last 3 results) No results for input(s): BNP in the last 8760 hours. Basic Metabolic Panel: Recent Labs  Lab 05/19/20 1405 05/20/20 0133 05/21/20 0548 05/22/20 0550 05/23/20 0651  NA 147* 148* 148* 148* 140  K 4.8 4.8 4.2 3.9 3.9  CL 109 115* 118* 119* 111  CO2 28 20* 23 22 23   GLUCOSE 107* 91 145* 106* 99  BUN 62* 58* 42* 27* 16  CREATININE 1.84* 1.61* 1.16* 1.03* 0.97  CALCIUM 9.8 9.4 9.0 8.9 8.5*   Liver Function Tests: Recent Labs  Lab 05/19/20 1405 05/20/20 0133  AST 39 32  ALT 25 27  ALKPHOS 68 67  BILITOT 0.9 0.7  PROT 7.0 6.6  ALBUMIN 3.2* 3.2*   No results for input(s): LIPASE, AMYLASE in the last 168 hours. No results for input(s): AMMONIA in the last 168 hours. CBC: Recent Labs  Lab 05/19/20 1405 05/20/20 0133 05/21/20 0548 05/22/20 0550 05/23/20 0651  WBC 7.6 10.4 7.5 6.2 7.0  NEUTROABS 4.9  --   --   --   --   HGB 13.5 12.7 12.0 10.5* 10.7*  HCT 42.7 40.7 38.3 32.9* 32.9*  MCV 99.5 100.7* 101.1* 98.2 98.2  PLT 404* 396 346 290 267   Cardiac Enzymes: No results for input(s): CKTOTAL, CKMB, CKMBINDEX, TROPONINI in the last 168 hours. BNP: Invalid input(s): POCBNP CBG: Recent Labs  Lab 05/19/20 1400  GLUCAP 98   D-Dimer No results for  input(s): DDIMER in the last 72 hours. Hgb A1c No results for input(s): HGBA1C in the last 72 hours. Lipid Profile No results for input(s): CHOL, HDL, LDLCALC, TRIG, CHOLHDL, LDLDIRECT in the last 72 hours. Thyroid function studies No results for input(s): TSH, T4TOTAL, T3FREE, THYROIDAB in the last 72 hours.  Invalid input(s): FREET3 Anemia work up No results for input(s): VITAMINB12, FOLATE, FERRITIN, TIBC, IRON, RETICCTPCT in the last 72 hours. Urinalysis    Component Value Date/Time   COLORURINE YELLOW 04/15/2020 2049    APPEARANCEUR CLEAR 04/15/2020 2049   LABSPEC 1.025 04/15/2020 2049   PHURINE 5.0 04/15/2020 2049   GLUCOSEU NEGATIVE 04/15/2020 2049   HGBUR MODERATE (A) 04/15/2020 2049   BILIRUBINUR NEGATIVE 04/15/2020 2049   KETONESUR 20 (A) 04/15/2020 2049   PROTEINUR 100 (A) 04/15/2020 2049   NITRITE NEGATIVE 04/15/2020 2049   LEUKOCYTESUR NEGATIVE 04/15/2020 2049   Sepsis Labs Invalid input(s): PROCALCITONIN,  WBC,  LACTICIDVEN Microbiology Recent Results (from the past 240 hour(s))  SARS Coronavirus 2 by RT PCR (hospital order, performed in [facility] hospital lab) Nasopharyngeal Nasopharyngeal Swab     Status: None   Collection Time: 05/19/20  5:44 PM   Specimen: Nasopharyngeal Swab  Result Value Ref Range Status   SARS Coronavirus 2 NEGATIVE NEGATIVE Final    Comment: (NOTE) SARS-CoV-2 target nucleic acids are NOT DETECTED.  The SARS-CoV-2 RNA is generally detectable in upper and lower respiratory specimens during the acute phase of infection. The lowest concentration of SARS-CoV-2 viral copies this assay can detect is 250 copies / mL. A negative result does not preclude SARS-CoV-2 infection and should not be used as the sole basis for treatment or other patient management decisions.  A negative result may occur with improper specimen collection / handling, submission of specimen other than nasopharyngeal swab, presence of viral mutation(s) within the areas targeted by this assay, and inadequate number of viral copies (<250 copies / mL). A negative result must be combined with clinical observations, patient history, and epidemiological information.  Fact Sheet for Patients:   BoilerBrush.com.cy  Fact Sheet for Healthcare Providers: https://pope.com/  This test is not yet approved or  cleared by the [place] FDA and has been authorized for detection and/or diagnosis of SARS-CoV-2 by FDA under an Emergency Use Authorization (EUA).   This EUA will remain in effect (meaning this test can be used) for the duration of the COVID-19 declaration under Section 564(b)(1) of the Act, 21 U.S.C. section 360bbb-3(b)(1), unless the authorization is terminated or revoked sooner.  Performed at [facility], [address], [place], [place] [address]   MRSA PCR Screening     Status: Abnormal   Collection Time: 05/20/20 11:27 AM   Specimen: Nasal Mucosa; Nasopharyngeal  Result Value Ref Range Status   MRSA by PCR POSITIVE (A) NEGATIVE Final    Comment:        The GeneXpert MRSA Assay (FDA approved for NASAL specimens only), is one component of a comprehensive MRSA colonization surveillance program. It is not intended to diagnose MRSA infection nor to guide or monitor treatment for MRSA infections. RESULT CALLED TO, READ BACK BY AND VERIFIED WITH: MAYS,J. RN @1311  05/20/20 BILLINGSLEY,L Performed at [facility], [address] [place], [place] [address]   Culture, Urine     Status: Abnormal   Collection Time: 05/20/20  3:24 PM   Specimen: Urine, Catheterized  Result Value Ref Range Status   Specimen Description   Final  URINE, CATHETERIZED Performed at [facility], [address], [place], [place] [address]    Special Requests   Final    NONE Performed at [facility], [address], [place], [place] [address]    Culture (A)  Final    >=100,000 COLONIES/mL ESCHERICHIA COLI Confirmed Extended Spectrum Beta-Lactamase Producer (ESBL).  In bloodstream infections from ESBL organisms, carbapenems are preferred over piperacillin/tazobactam. They are shown to have a lower risk of mortality.    Report Status 05/22/2020 FINAL  Final   Organism ID, Bacteria ESCHERICHIA COLI (A)  Final      Susceptibility   Escherichia coli - MIC*    AMPICILLIN >=32 RESISTANT Resistant     CEFAZOLIN >=64 RESISTANT Resistant     CEFTRIAXONE >=64 RESISTANT Resistant      CIPROFLOXACIN <=0.25 SENSITIVE Sensitive     GENTAMICIN <=1 SENSITIVE Sensitive     IMIPENEM <=0.25 SENSITIVE Sensitive     NITROFURANTOIN <=16 SENSITIVE Sensitive     TRIMETH/SULFA <=20 SENSITIVE Sensitive     AMPICILLIN/SULBACTAM >=32 RESISTANT Resistant     PIP/TAZO <=4 SENSITIVE Sensitive     * >=100,000 COLONIES/mL ESCHERICHIA COLI     Time coordinating discharge: 35 minutes  SIGNED: Lanae Boast, MD  [facility] 05/24/2020, 11:04 AM  If 7PM-7AM, please contact night-coverage [email or web address]

## 2020-05-24 NOTE — Progress Notes (Signed)
PMT no charge note  Received correspondence from [facility] colleague Olegario Messier regarding patient's possible discharge to facility with hospice and patient's husband's questions regarding the same.   As per discussions with hospice liaisons, they have reached out to and discussed in detail with patient's husband and daughter regarding benefits of hospice services at the patient's facility and it is my impression that everyone is in agreement.   No additional PMT specific recommendations in this regard, how ever, attempted to call both husband [phone] and daughter [phone], unable to reach at this time.   Ms Ruberg has dementia, she has had recurrent hospitalizations, along with recurrent psych input needed for agitation. Patient is at high risk for recurrent infections, dehydration, electrolyte abnormalities, falls and aspiration in my opinion. Hence, would agree with recommendation for addition of hospice as an extra layer of support at [facility]. As such, her prognosis could be 6 months or less.   Agree with discharge to facility with hospice today. No further recommendations from palliative service at [facility], in my opinion. Appreciate TOC and hospice liaisons close communications and assistance provided to the patient and her family as they handle these difficult decision making conversations and transitions.   Rosalin Hawking MD [facility].

## 2020-05-24 NOTE — Progress Notes (Signed)
Civil engineer, contracting (ACC)  Spoke with daughter Misty Stanley and spouse Molly Maduro about discharge plans today back to [facility]. Both are concerned with the care they have provided however are hopeful with the help of ACC the care will improve. I have educated family on what we offer within [facility] as far as Charity fundraiser, NT, Orthoptist, MD. Both agree that this is needed.   No DME needed at this point as [facility] place have ordered a new WC for her. Melissa at [facility] is checking on delivery and I have updated family on that as well as the policy for bed rails within memory care units.   Per discussion, plan is for discharge by [facility].  Please send signed and completed DNR form home with patient/family. Patient will need prescriptions for discharge comfort medications  [facility]erral [facility] aware of the above. Please notify ACC when patient is ready to leave the unit at discharge. (Call [phone] or [phone] after 5pm.) ACC information and contact numbers given to spouse and daughter.      Please call with any hospice related questions.     Thank you for this referral.     Yolande Jolly, RN, [facility] (listed on AMION under [facility])  [phone]

## 2020-05-24 NOTE — Progress Notes (Signed)
Report called to Diane, Charity fundraiser at [facility]. Condition unchanged. Pt remains cooperative. PTAR for transportation. Melton Alar, RN

## 2020-05-24 NOTE — TOC Transition Note (Signed)
Transition of Care [facility]) - CM/SW Discharge Note   Patient Details  Name: Kristin Grimes MRN: [id number] Date of Birth: January 24, 1941  Transition of Care [facility]) CM/SW Contact:  Lanier Clam, RN Phone Number: 05/24/2020, 12:34 PM   Clinical Narrative:PTAR called going to [facility] care w/[facility] tel# for nurse to give report [phone]. No further CM needs.         Barriers to Discharge: No Barriers Identified   Patient Goals and CMS Choice        Discharge Placement              Patient chooses bed at:  [facility]memory care) Patient to be transferred to facility by: PTAR Name of family member notified: Molly Maduro spouse Patient and family notified of of transfer: 05/24/20  Discharge Plan and Services In-house Referral: Clinical Social Work                        HH Arranged: RN HH Agency: [facility] Date HH Agency Contacted: 05/24/20 Time HH Agency Contacted: [phone] Representative spoke with at [facility] Agency: Melissa  Social Determinants of Health (SDOH) Interventions     Readmission Risk Interventions Readmission Risk Prevention Plan 04/18/2020  Post Dischage Appt Complete  Medication Screening Complete  Transportation Screening Complete

## 2020-05-24 NOTE — Plan of Care (Signed)
  Problem: Education: Goal: Knowledge of General Education information will improve Description: Including pain rating scale, medication(s)/side effects and non-pharmacologic comfort measures Outcome: Not Applicable   Problem: Health Behavior/Discharge Planning: Goal: Ability to manage health-related needs will improve Outcome: Not Applicable   Problem: Clinical Measurements: Goal: Ability to maintain clinical measurements within normal limits will improve Outcome: Not Applicable Goal: Will remain free from infection Outcome: Not Applicable Goal: Diagnostic test results will improve Outcome: Not Applicable Goal: Respiratory complications will improve Outcome: Not Applicable Goal: Cardiovascular complication will be avoided Outcome: Not Applicable   Problem: Activity: Goal: Risk for activity intolerance will decrease Outcome: Not Applicable   Problem: Nutrition: Goal: Adequate nutrition will be maintained Outcome: Not Applicable   Problem: Coping: Goal: Level of anxiety will decrease Outcome: Not Applicable   Problem: Elimination: Goal: Will not experience complications related to bowel motility Outcome: Not Applicable   Problem: Pain Managment: Goal: General experience of comfort will improve Outcome: Not Applicable   Problem: Safety: Goal: Ability to remain free from injury will improve Outcome: Not Applicable   Problem: Skin Integrity: Goal: Risk for impaired skin integrity will decrease Outcome: Not Applicable

## 2020-12-13 DEATH — deceased

## 2021-04-14 IMAGING — CT CT ABD-PELV W/O CM
3 of 8 series · 14 of 36 positions shown, 16 images · non-contrast
Comparison: None.

CLINICAL DATA: Chest trauma.

EXAM:
CT CHEST, ABDOMEN AND PELVIS WITHOUT CONTRAST
TECHNIQUE: Multidetector CT imaging of the chest, abdomen and pelvis was
performed following the standard protocol without IV contrast.

[Series 3: cap w/o · axial · non-contrast · 0.73mm/px · z∈[-813,-478]mm · 6 of 95 slices shown, 8 images]
[im 14/95  mediastinal]
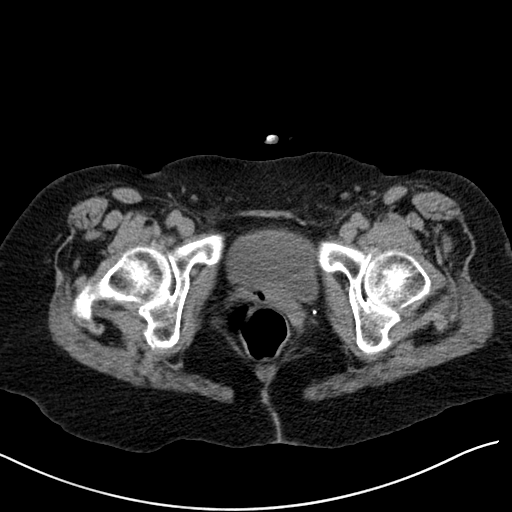
[im 14/95  lung]
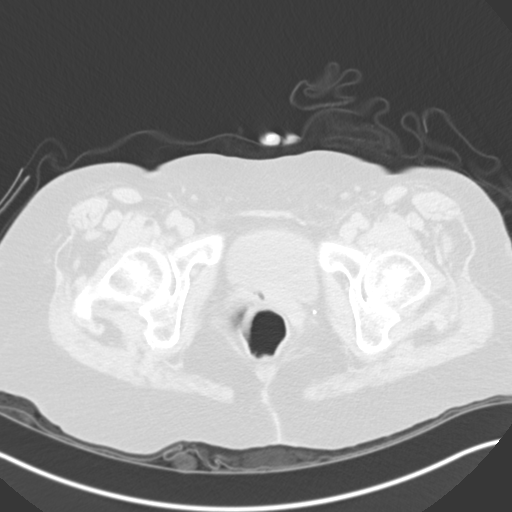
[im 27/95  lung]
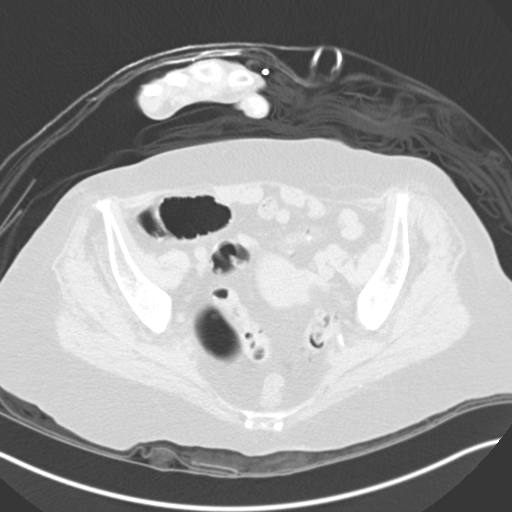
[im 41/95  lung]
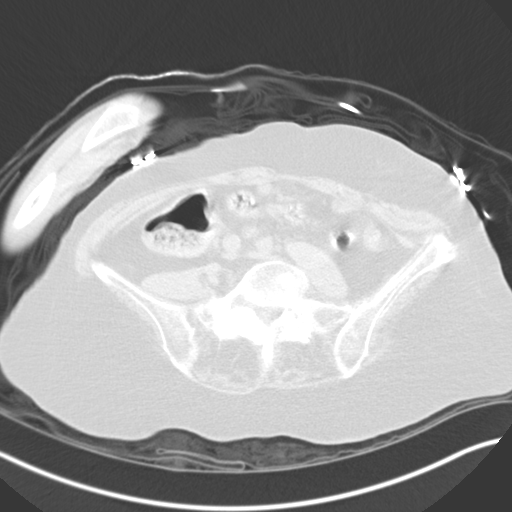
[im 54/95  lung]
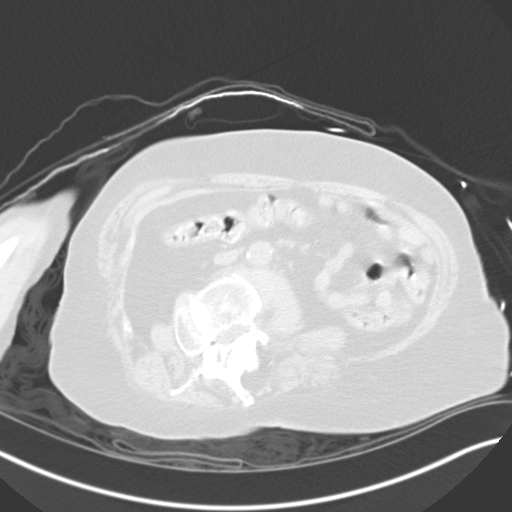
[im 68/95  mediastinal]
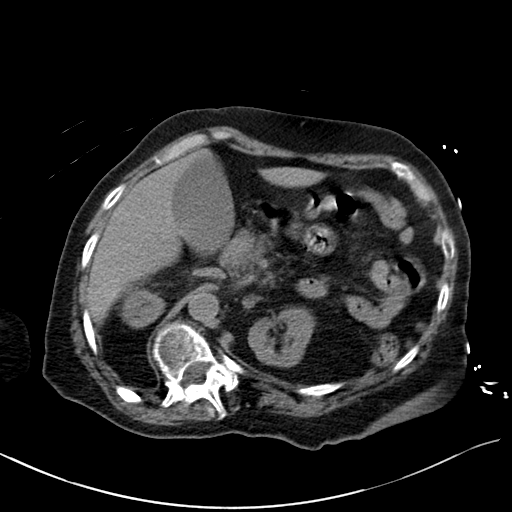
[im 68/95  lung]
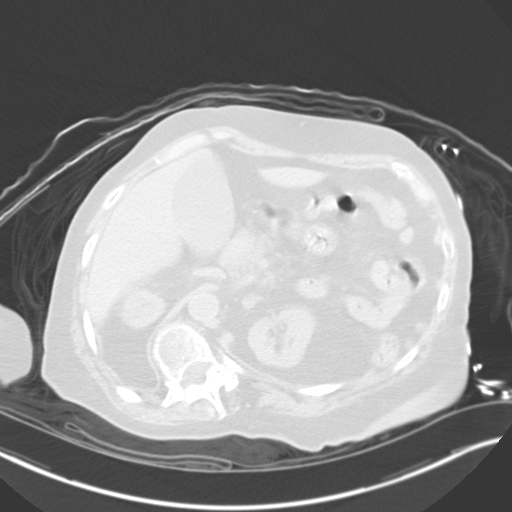
[im 81/95  lung]
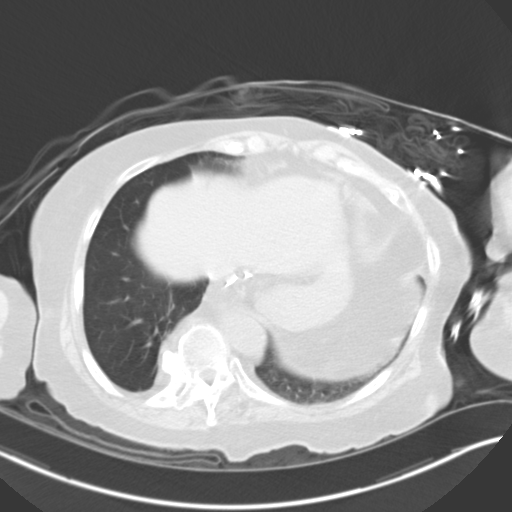

[Series 6: coronals · coronal · 0.78mm/px · 1 of 147 slices shown]
[im 74/147  lung]
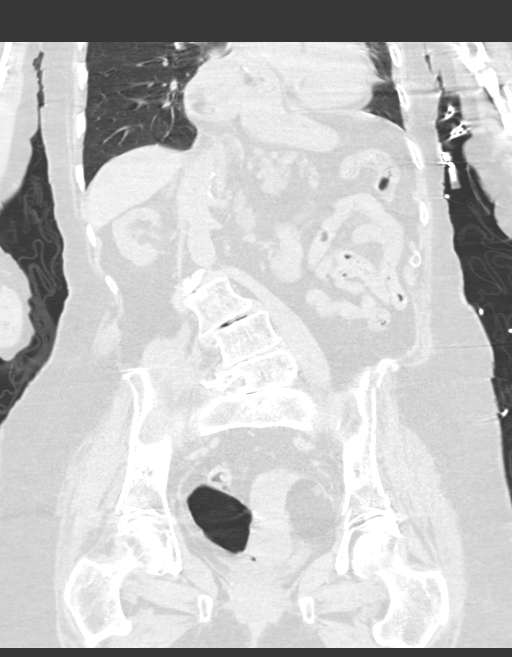

[Series 10: lung · axial · 0.69mm/px · z∈[-463,-305]mm · 7 of 107 slices shown]
[im 14/107  lung]
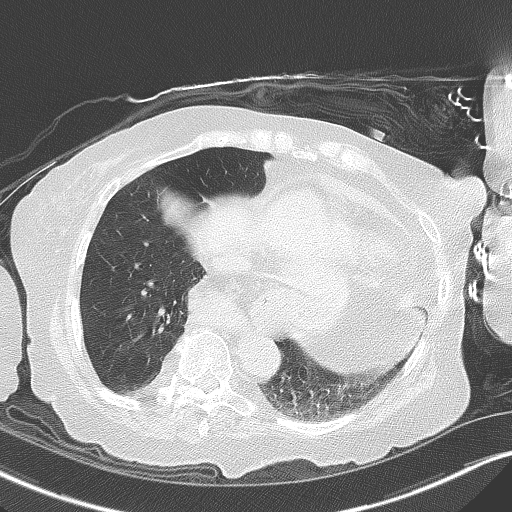
[im 27/107  lung]
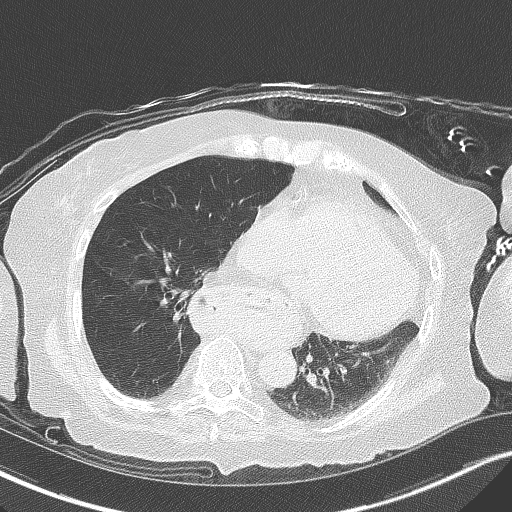
[im 40/107  lung]
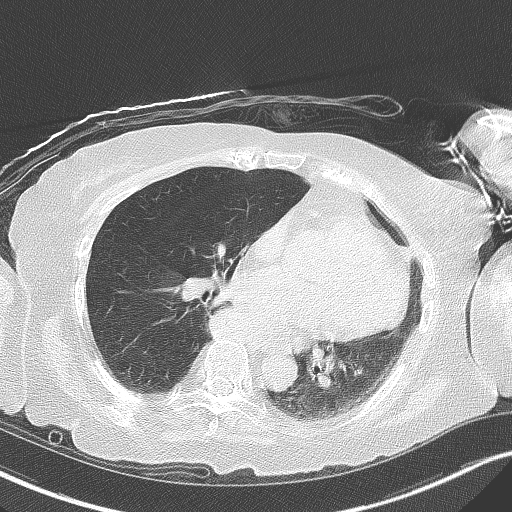
[im 54/107  lung]
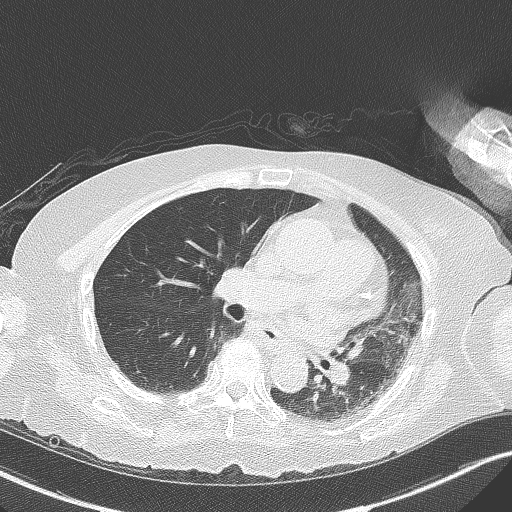
[im 67/107  lung]
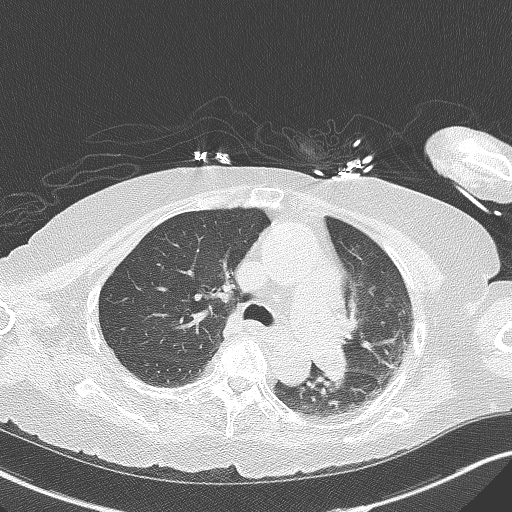
[im 80/107  lung]
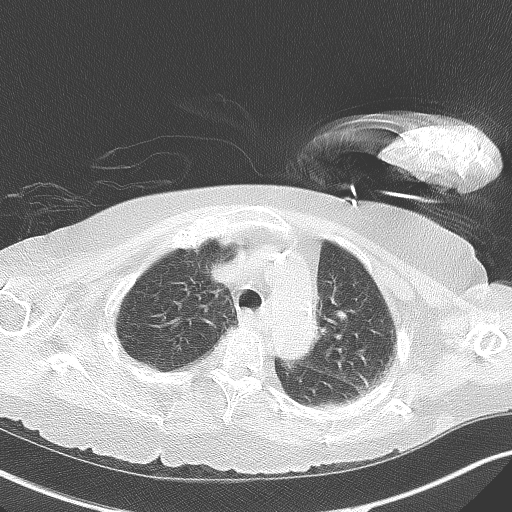
[im 93/107  lung]
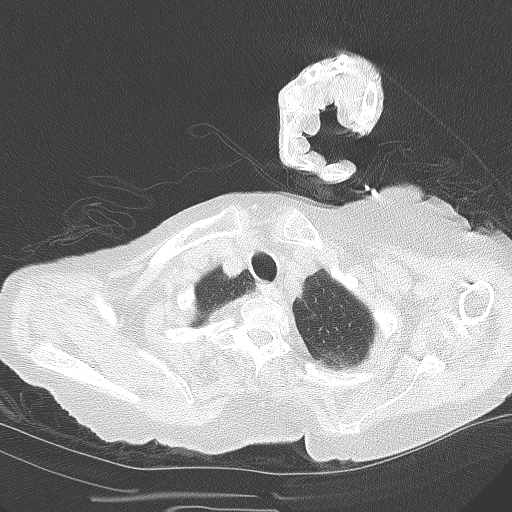

[14 of 36 positions shown; findings below may reference images not displayed]

FINDINGS: CT CHEST FINDINGS

Cardiovascular: The heart size is normal. The ascending aorta
appears to be borderline aneurysmal measuring up to approximately 4
cm. There are atherosclerotic changes of the thoracic aorta and
coronary arteries. There is no significant pericardial effusion.

Mediastinum/Nodes:

-- No mediastinal lymphadenopathy.

-- No hilar lymphadenopathy.

-- No axillary lymphadenopathy.

-- No supraclavicular lymphadenopathy.

-- Normal thyroid gland where visualized.

-there is a large hiatal hernia.

Lungs/Pleura: Examination of the lung fields is limited by
significant respiratory motion artifact. There is no pneumothorax.
No large pleural effusion. There is atelectasis at the lung bases.

Musculoskeletal: Evaluation of the osseous structures is severely
limited by extensive motion artifact. There is an age-indeterminate
compression fracture of the T4 vertebral body.

CT ABDOMEN PELVIS FINDINGS

Hepatobiliary: Evaluation of the liver is limited by lack of IV
contrast. There are indeterminate hypoattenuating lesions in the
liver measuring up to approximately 3.4 cm. The gallbladder is
mildly distended.There is no biliary ductal dilation.

Pancreas: Normal contours without ductal dilatation. No
peripancreatic fluid collection.

Spleen: Unremarkable.

Adrenals/Urinary Tract:

--Adrenal glands: Unremarkable.

--Right kidney/ureter: No hydronephrosis or radiopaque kidney
stones.

--Left kidney/ureter: No hydronephrosis or radiopaque kidney stones.

--Urinary bladder: Unremarkable.

Stomach/Bowel:

--Stomach/Duodenum: There is a large hiatal hernia.

--Small bowel: Unremarkable.

--Colon: There is scattered colonic diverticula without CT evidence
for diverticulitis.

--Appendix: Not visualized. No right lower quadrant inflammation or
free fluid.

Vascular/Lymphatic: Atherosclerotic calcification is present within
the non-aneurysmal abdominal aorta, without hemodynamically
significant stenosis.

--No retroperitoneal lymphadenopathy.

--No mesenteric lymphadenopathy.

--No pelvic or inguinal lymphadenopathy.

Reproductive: There is a fat containing left ovarian lesion
measuring approximately 4.3 cm (axial series 3, image 73).

Other: No ascites or free air. The abdominal wall is normal.

Musculoskeletal. There are degenerative changes throughout the
thoracolumbar spine. There is a degenerative dextroscoliosis of the
lumbar spine.
IMPRESSION: 1. Examination is severely limited by extensive motion artifact and
lack of IV contrast.
2. No definite acute abnormality given the above limitations.
Specifically, there is no left-sided pneumothorax as was identified
on the patient's recent chest x-ray. Additionally, there is no
definite displaced left-sided rib fracture, however evaluation is
severely limited by motion artifact.
3. There are multiple indeterminate hypoattenuating masses in the
liver. Further evaluation with a contrast enhanced CT or MRI is
recommended when the patient's condition improves in the patient is
able to follow commands and breathing instructions.
4. Borderline ascending thoracic aortic aneurysm measuring
approximately 4 cm. Recommend annual imaging followup by CTA or MRA.
This recommendation follows 4323
ACCF/AHA/AATS/ACR/ASA/SCA/NAHIMANA/AUJLA/SAINI/ELPRO Guidelines for the
Diagnosis and Management of Patients with Thoracic Aortic Disease.
Circulation. 4323; 121: [id number]. Aortic aneurysm NOS (CG2RU-QUP.7)
5. Large hiatal hernia.
6. Diverticulosis without CT evidence for diverticulitis.
7. There is a 4.3 cm fat containing left ovarian lesion consistent
with a dermoid.
8.  Aortic Atherosclerosis (CG2RU-DLY.Y).

Aortic Atherosclerosis (CG2RU-DLY.Y).
